# Patient Record
Sex: Female | Born: 1970 | Race: Black or African American | Hispanic: No | Marital: Married | Smoking: Never smoker
Health system: Southern US, Community
[De-identification: ages and names within clinical notes are randomized; demographics above are authoritative.]

## PROBLEM LIST (undated history)

## (undated) DIAGNOSIS — I499 Cardiac arrhythmia, unspecified: Secondary | ICD-10-CM

## (undated) DIAGNOSIS — R935 Abnormal findings on diagnostic imaging of other abdominal regions, including retroperitoneum: Secondary | ICD-10-CM

## (undated) DIAGNOSIS — R7989 Other specified abnormal findings of blood chemistry: Secondary | ICD-10-CM

## (undated) DIAGNOSIS — M459 Ankylosing spondylitis of unspecified sites in spine: Secondary | ICD-10-CM

## (undated) DIAGNOSIS — K5090 Crohn's disease, unspecified, without complications: Secondary | ICD-10-CM

## (undated) DIAGNOSIS — Z1589 Genetic susceptibility to other disease: Secondary | ICD-10-CM

## (undated) DIAGNOSIS — E785 Hyperlipidemia, unspecified: Secondary | ICD-10-CM

## (undated) DIAGNOSIS — B019 Varicella without complication: Secondary | ICD-10-CM

## (undated) DIAGNOSIS — Z8619 Personal history of other infectious and parasitic diseases: Secondary | ICD-10-CM

## (undated) DIAGNOSIS — J45909 Unspecified asthma, uncomplicated: Secondary | ICD-10-CM

## (undated) DIAGNOSIS — E611 Iron deficiency: Secondary | ICD-10-CM

## (undated) DIAGNOSIS — K9041 Non-celiac gluten sensitivity: Secondary | ICD-10-CM

## (undated) DIAGNOSIS — Z90710 Acquired absence of both cervix and uterus: Secondary | ICD-10-CM

## (undated) DIAGNOSIS — D649 Anemia, unspecified: Secondary | ICD-10-CM

## (undated) DIAGNOSIS — B009 Herpesviral infection, unspecified: Secondary | ICD-10-CM

## (undated) DIAGNOSIS — M62838 Other muscle spasm: Secondary | ICD-10-CM

## (undated) DIAGNOSIS — N939 Abnormal uterine and vaginal bleeding, unspecified: Secondary | ICD-10-CM

## (undated) DIAGNOSIS — M47819 Spondylosis without myelopathy or radiculopathy, site unspecified: Secondary | ICD-10-CM

## (undated) DIAGNOSIS — K529 Noninfective gastroenteritis and colitis, unspecified: Secondary | ICD-10-CM

## (undated) DIAGNOSIS — N390 Urinary tract infection, site not specified: Secondary | ICD-10-CM

## (undated) DIAGNOSIS — A630 Anogenital (venereal) warts: Secondary | ICD-10-CM

## (undated) HISTORY — DX: Acquired absence of both cervix and uterus: Z90.710

## (undated) HISTORY — DX: Herpesviral infection, unspecified: B00.9

## (undated) HISTORY — DX: Ankylosing spondylitis of unspecified sites in spine: M45.9

## (undated) HISTORY — DX: Non-celiac gluten sensitivity: K90.41

## (undated) HISTORY — DX: Other specified abnormal findings of blood chemistry: R79.89

## (undated) HISTORY — DX: Crohn's disease, unspecified, without complications: K50.90

## (undated) HISTORY — DX: Hypocalcemia: E83.51

## (undated) HISTORY — PX: COLONOSCOPY: SHX174

## (undated) HISTORY — DX: Hyperlipidemia, unspecified: E78.5

## (undated) HISTORY — DX: Personal history of other infectious and parasitic diseases: Z86.19

## (undated) HISTORY — DX: Anogenital (venereal) warts: A63.0

## (undated) HISTORY — DX: Anemia, unspecified: D64.9

## (undated) HISTORY — DX: Noninfective gastroenteritis and colitis, unspecified: K52.9

## (undated) HISTORY — DX: Spondylosis without myelopathy or radiculopathy, site unspecified: M47.819

## (undated) HISTORY — DX: Iron deficiency: E61.1

## (undated) HISTORY — DX: Abnormal findings on diagnostic imaging of other abdominal regions, including retroperitoneum: R93.5

## (undated) HISTORY — PX: MOUTH SURGERY: SHX715

## (undated) HISTORY — DX: Abnormal uterine and vaginal bleeding, unspecified: N93.9

## (undated) HISTORY — DX: Other muscle spasm: M62.838

## (undated) HISTORY — DX: Unspecified asthma, uncomplicated: J45.909

## (undated) HISTORY — PX: WISDOM TOOTH EXTRACTION: SHX21

## (undated) HISTORY — DX: Varicella without complication: B01.9

## (undated) HISTORY — DX: Urinary tract infection, site not specified: N39.0

## (undated) HISTORY — DX: Genetic susceptibility to other disease: Z15.89

---

## 2014-07-06 ENCOUNTER — Ambulatory Visit (INDEPENDENT_AMBULATORY_CARE_PROVIDER_SITE_OTHER): Payer: Federal, State, Local not specified - PPO | Admitting: Family Medicine

## 2014-07-06 ENCOUNTER — Encounter: Payer: Self-pay | Admitting: Family Medicine

## 2014-07-06 VITALS — BP 100/70 | HR 60 | Temp 98.2°F | Ht 63.0 in | Wt 97.0 lb

## 2014-07-06 DIAGNOSIS — R7989 Other specified abnormal findings of blood chemistry: Secondary | ICD-10-CM | POA: Insufficient documentation

## 2014-07-06 DIAGNOSIS — M47819 Spondylosis without myelopathy or radiculopathy, site unspecified: Secondary | ICD-10-CM

## 2014-07-06 DIAGNOSIS — Z1589 Genetic susceptibility to other disease: Secondary | ICD-10-CM | POA: Insufficient documentation

## 2014-07-06 DIAGNOSIS — K9041 Non-celiac gluten sensitivity: Secondary | ICD-10-CM | POA: Insufficient documentation

## 2014-07-06 NOTE — Assessment & Plan Note (Addendum)
Continue exercise, prn nsaids. Obtain records. 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 From my review of uptodate, prednisone is not preferred for long term management but may work for flares and this has seemed to work for patient in the past (including 2 weeks ago when went to urgent care). One concern for me is patient is already underweight and likely with low bone density and steroids may worsen this (but also may be needed and therefore tough balance) Records will hopefull give more information and I will follow up with patient in 2 months to allow time to receive records. Will make determination on needed bloodwork at that time as well. Patient has seen rheumatology before and didn't seem interested in meds outside of nsaids and prednisone. 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 Since being off gluten has helped her, I did not dissuade this threapy.

## 2014-07-06 NOTE — Patient Instructions (Addendum)
Wonderful to meet you.   No obvious abnormalities on exam.   Want to start by getting records and then have you back in 2 months to reassess and see if we need to update your bloodwork.   Please call for a mammogram.   I am happy to do Pap smears for you or you can seek ob/gyn care.   Happy to care for your whole family if you would like.   Drop off your release of information form at the front desk.   Consider allegra or zyrtec as allergies are certainly different here.   Addendum: advised weight closer to 105

## 2014-07-06 NOTE — Progress Notes (Signed)
Janice Reddish, MD Phone: [phone]  Subjective:  Patient presents today to establish care as new patient. Chief complaint-noted.   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 Several years ago was getting sick recurrently. 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  Losing weight, not feeling well, sickness and pain especially in low back and neck. Tested positive for gluten intolerance fecally. Improved about 80% in symptoms with gluten free diet.   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 Took prednisone intermittently for flares in pain which seems to help. Previously had been on aleve regularly. .Diet, exercise, regular aleve use seem to be mainstay of her treatment. . Tendency to have iron and vitamin D low. Had seen rheumatologist but patient/medical doctor made decision to not place on immunotherapy. Felt great on raw diet when she tried it. Monitored through CRP levels at past doctor. 2 weeks ago had a flare and was placed on prednisone at an urgent care. Symptoms have resolved. She takes supplements including fish oil, vitamin D regularly (has had low vitamin D. Severe stiffness 2-3x a year now.   Health Maintanence LMP 06/29/14 Pap last year Mammogram never  Birth control-Husband with vasectomy  ROS-intermittent neck and back stiffness  The following were reviewed and entered/updated in epic: Past Medical History  Diagnosis Date  . Chicken pox   . UTI (lower urinary tract infection)   . Low serum vitamin D   . Non-celiac gluten sensitivity   . Spondyloarthritis    Patient Active Problem List   Diagnosis Date Noted  . Low serum vitamin D   . Non-celiac gluten sensitivity   . Spondyloarthritis    Past Surgical History  Procedure Laterality Date  . None    . Mouth surgery      cyst in mouth  . Wisdom tooth extraction    .  Colonoscopy      Family History  Problem Relation Age of Onset  . Hypertension Mother   . Diabetes Mother     ? father    Medications- reviewed and updated No current outpatient prescriptions on file.   No current facility-administered medications for this visit.    Allergies-reviewed and updated No Known Allergies  History   Social History  . Marital Status: Married    Spouse Name: N/A    Number of Children: N/A  . Years of Education: N/A   Social History Main Topics  . Smoking status: Never Smoker   . Smokeless tobacco: Not on file  . Alcohol Use: 1.0 oz/week    2 drink(s) per week  . Drug Use: No  . Sexual Activity: Yes    Partners: Male   Other Topics Concern  . 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 [name]. [name].    Got 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 Oropharynx normal. Good dentition.  Eyes: sclera and lids normal, PERRLA Neck: no thyromegaly, no lymphadenopathy CV: RRR no murmurs rubs or gallops Lungs: CTAB no crackles, wheeze, rhonchi Abdomen: soft/nontender/nondistended/normal bowel sounds. No rebound or guarding.  Ext: no edema, 2+ DP and PT and radial pulses Skin: warm, dry, no rash Neuro: 5/5 strength upper and lower extremities   Assessment/Plan:  Spondyloarthritis Continue exercise, prn nsaids. Obtain records. 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 From my review of  uptodate, prednisone is not preferred but this has seemed to work for patient in the past. Records will hopefull give more information and I will follow up with patient in 2 months to allow time to receive records. Will make determination on needed bloodwork at that time as well.   Have also contacted Dr. Charlann Boxer to see as patient wants to avoid rheum medications most likely if he thought he could help in care for this woman with currently unclear diagnosis.

## 2014-07-11 ENCOUNTER — Other Ambulatory Visit (INDEPENDENT_AMBULATORY_CARE_PROVIDER_SITE_OTHER): Payer: Federal, State, Local not specified - PPO

## 2014-07-11 ENCOUNTER — Encounter: Payer: Self-pay | Admitting: Internal Medicine

## 2014-07-11 ENCOUNTER — Ambulatory Visit (INDEPENDENT_AMBULATORY_CARE_PROVIDER_SITE_OTHER)
Admission: RE | Admit: 2014-07-11 | Discharge: 2014-07-11 | Disposition: A | Discharge status: Home / Self Care | Payer: Federal, State, Local not specified - PPO | Source: Ambulatory Visit | Attending: Internal Medicine | Admitting: Internal Medicine

## 2014-07-11 ENCOUNTER — Institutional Professional Consult (permissible substitution): Payer: Federal, State, Local not specified - PPO | Admitting: Internal Medicine

## 2014-07-11 ENCOUNTER — Ambulatory Visit (INDEPENDENT_AMBULATORY_CARE_PROVIDER_SITE_OTHER): Payer: Federal, State, Local not specified - PPO | Admitting: Internal Medicine

## 2014-07-11 VITALS — BP 100/64 | HR 88 | Temp 98.0°F | Ht 63.0 in | Wt 100.4 lb

## 2014-07-11 DIAGNOSIS — R0600 Dyspnea, unspecified: Secondary | ICD-10-CM | POA: Insufficient documentation

## 2014-07-11 DIAGNOSIS — D509 Iron deficiency anemia, unspecified: Secondary | ICD-10-CM | POA: Insufficient documentation

## 2014-07-11 DIAGNOSIS — J4530 Mild persistent asthma, uncomplicated: Secondary | ICD-10-CM

## 2014-07-11 DIAGNOSIS — R058 Other specified cough: Secondary | ICD-10-CM

## 2014-07-11 DIAGNOSIS — R05 Cough: Secondary | ICD-10-CM

## 2014-07-11 LAB — CBC WITH DIFFERENTIAL/PLATELET
Basophils Absolute: 0 10*3/uL (ref 0.0–0.1)
Basophils Relative: 0.2 % (ref 0.0–3.0)
EOS PCT: 1.8 % (ref 0.0–5.0)
Eosinophils Absolute: 0.2 10*3/uL (ref 0.0–0.7)
HCT: 32 % — ABNORMAL LOW (ref 36.0–46.0)
HEMOGLOBIN: 10.1 g/dL — AB (ref 12.0–15.0)
Lymphocytes Relative: 26.7 % (ref 12.0–46.0)
Lymphs Abs: 2.3 10*3/uL (ref 0.7–4.0)
MCHC: 31.7 g/dL (ref 30.0–36.0)
MCV: 69.3 fl — ABNORMAL LOW (ref 78.0–100.0)
MONOS PCT: 6.6 % (ref 3.0–12.0)
Monocytes Absolute: 0.6 10*3/uL (ref 0.1–1.0)
Neutro Abs: 5.6 10*3/uL (ref 1.4–7.7)
Neutrophils Relative %: 64.7 % (ref 43.0–77.0)
PLATELETS: 404 10*3/uL — AB (ref 150.0–400.0)
RBC: 4.62 Mil/uL (ref 3.87–5.11)
RDW: 18.7 % — ABNORMAL HIGH (ref 11.5–15.5)
WBC: 8.6 10*3/uL (ref 4.0–10.5)

## 2014-07-11 MED ORDER — MOMETASONE FURO-FORMOTEROL FUM 100-5 MCG/ACT IN AERO: 2.0000 | INHALATION_SPRAY | Freq: Two times a day (BID) | RESPIRATORY_TRACT | Status: DC

## 2014-07-11 NOTE — Patient Instructions (Addendum)
Please remember to go to the lab and x-ray department downstairs for your tests - we will call you with the results when they are available.    GERD (REFLUX)  is an extremely common cause of respiratory symptoms, many times with no significant heartburn at all.    It can be treated with medication, but also with lifestyle changes including avoidance of late meals, excessive alcohol, smoking cessation, and avoid fatty foods, chocolate, peppermint, colas, red wine, and acidic juices such as orange juice.  NO MINT OR MENTHOL PRODUCTS SO NO COUGH DROPS  USE SUGARLESS CANDY INSTEAD (jolley ranchers or Stover's)  NO OIL BASED VITAMINS - use powdered substitutes.    Work on inhaler technique:  relax and gently blow all the way out then take a nice smooth deep breath back in, triggering the inhaler at same time you start breathing in.  Hold for up to 5 seconds if you can.  Rinse and gargle with water when done    Try dulera 100 Take 2 puffs first thing in am and then another 2 puffs about 12 hours later.   Please schedule a follow up office visit in 2 weeks, sooner if needed

## 2014-07-11 NOTE — Progress Notes (Signed)
Quick Note:  Spoke with pt and notified of results per Dr. Wert. Pt verbalized understanding and denied any questions.  ______ 

## 2014-07-11 NOTE — Progress Notes (Signed)
   Subjective:    Patient ID: Janice Zuniga, female    DOB: Dec 18, 1970  MRN: [id number]  HPI  82 yobf never smoker with some sniffles itchy throat in Spring x decades controlled originally with otcs like  clariton with need for inhaler transiently 2012 only while dog in house then w/in a week of movingto  [place] from [place] = corporate apt since May 13 2014 but symptoms started earlier in August noted sob with speech/ mild chest tightness better with saba but not eliminated (proair) so self referred 07/11/2014 to pulmonary clinic   07/11/2014 1st [facility] office visit/ Wert   Chief Complaint  Patient presents with  . Pulmonary Consult    Self referral. Pt c/o SOB with or without exertion for the past 2 months. Albuterol helps slightly- using albuterol inhaler a few times per day.   took liquid alb from children worked better [employer] on  07/09/14 before worked out  No noct symptoms Assoc with some coughing but no excess or purulent   mucus Allegra helped some  Worse with voice use which causes hoarseness  also  No obvious other patterns in day to day or daytime variabilty or assoc  cp or subjective wheeze overt sinus or hb symptoms. No unusual exp hx or h/o childhood pna/ asthma or knowledge of premature birth.  Sleeping ok without nocturnal  or early am exacerbation  of respiratory  c/o's or need for noct saba. Also denies any obvious fluctuation of symptoms with weather or environmental changes or other aggravating or alleviating factors except as outlined above   Current Medications, Allergies, Complete Past Medical History, Past Surgical History, Family History, and Social History were reviewed in [facility] record.            Review of Systems  Constitutional: Negative for fever, chills and unexpected weight change.  HENT: Negative for congestion, dental problem, ear pain, nosebleeds, postnasal drip, rhinorrhea, sinus pressure, sneezing, sore  throat, trouble swallowing and voice change.   Eyes: Negative for visual disturbance.  Respiratory: Positive for shortness of breath. Negative for cough and choking.   Cardiovascular: Negative for chest pain and leg swelling.  Gastrointestinal: Negative for vomiting, abdominal pain and diarrhea.  Genitourinary: Negative for difficulty urinating.  Musculoskeletal: Negative for arthralgias.  Skin: Negative for rash.  Neurological: Negative for tremors, syncope and headaches.  Hematological: Does not bruise/bleed easily.       Objective:   Physical Exam  Wt Readings from Last 3 Encounters:  07/11/14 100 lb 6.4 oz (45.541 kg)  07/06/14 97 lb (43.999 kg)     Very pleasant amb bf with classic pseudowheeze only with fvc  HEENT: nl dentition, turbinates, and orophanx. Nl external ear canals without cough reflex   NECK :  without JVD/Nodes/TM/ nl carotid upstrokes bilaterally   LUNGS: no acc muscle use, clear to A and P bilaterally without cough on insp or exp maneuvers   CV:  RRR  no s3 or murmur or increase in P2, no edema   ABD:  soft and nontender with nl excursion in the supine position. No bruits or organomegaly, bowel sounds nl  MS:  warm without deformities, calf tenderness, cyanosis or clubbing  SKIN: warm and dry without lesions    NEURO:  alert, approp, no deficits     CXR  07/11/2014 :  No active cardiopulmonary disease.      Assessment & Plan:

## 2014-07-11 NOTE — Progress Notes (Signed)
Quick Note:  LMTCB ______ 

## 2014-07-12 ENCOUNTER — Other Ambulatory Visit (INDEPENDENT_AMBULATORY_CARE_PROVIDER_SITE_OTHER): Payer: Federal, State, Local not specified - PPO

## 2014-07-12 ENCOUNTER — Encounter: Payer: Self-pay | Admitting: Internal Medicine

## 2014-07-12 ENCOUNTER — Telehealth: Payer: Self-pay | Admitting: Internal Medicine

## 2014-07-12 DIAGNOSIS — J4530 Mild persistent asthma, uncomplicated: Secondary | ICD-10-CM | POA: Insufficient documentation

## 2014-07-12 DIAGNOSIS — J45909 Unspecified asthma, uncomplicated: Secondary | ICD-10-CM | POA: Insufficient documentation

## 2014-07-12 DIAGNOSIS — D509 Iron deficiency anemia, unspecified: Secondary | ICD-10-CM

## 2014-07-12 LAB — ALLERGY FULL PROFILE
Allergen, D pternoyssinus,d7: 3.76 kU/L — ABNORMAL HIGH
Alternaria Alternata: <0.1 kU/L
Aspergillus fumigatus, m3: <0.1 kU/L
BERMUDA GRASS: 83.5 kU/L — AB
BOX ELDER: 7.84 kU/L — AB
Bahia Grass: 88.6 kU/L — ABNORMAL HIGH
Cat Dander: 71.7 kU/L — ABNORMAL HIGH
Common Ragweed: 18.7 kU/L — ABNORMAL HIGH
Curvularia lunata: <0.1 kU/L
D. farinae: 3.76 kU/L — ABNORMAL HIGH
Dog Dander: 11.2 kU/L — ABNORMAL HIGH
Elm IgE: 2.32 kU/L — ABNORMAL HIGH
Fescue: 77.5 kU/L — ABNORMAL HIGH
G005 RYE, PERENNIAL: 73.5 kU/L — AB
G009 Red Top: 81.3 kU/L — ABNORMAL HIGH
Goldenrod: 9.42 kU/L — ABNORMAL HIGH
Helminthosporium halodes: <0.1 kU/L
House Dust Hollister: 10.4 kU/L — ABNORMAL HIGH
IGE (IMMUNOGLOBULIN E), SERUM: 475 kU/L — AB (ref <115)
Lamb's Quarters: 29.2 kU/L — ABNORMAL HIGH
OAK CLASS: 51.5 kU/L — AB
Plantain: 23.5 kU/L — ABNORMAL HIGH
STEMPHYLIUM BOTRYOSUM: 0.12 kU/L — AB
Sycamore Tree: 51.6 kU/L — ABNORMAL HIGH
TIMOTHY GRASS: 67.9 kU/L — AB

## 2014-07-12 LAB — IRON: Iron: 27 ug/dL — ABNORMAL LOW (ref 42–145)

## 2014-07-12 LAB — IRON AND TIBC
%SAT: 7 % — ABNORMAL LOW (ref 20–55)
Iron: 23 ug/dL — ABNORMAL LOW (ref 42–145)
TIBC: 327 ug/dL (ref 250–470)
UIBC: 304 ug/dL (ref 125–400)

## 2014-07-12 NOTE — Telephone Encounter (Signed)
Pt returned call.Janice Zuniga

## 2014-07-12 NOTE — Telephone Encounter (Signed)
Call patient : Studies are c/w lots of allergies esp cats/ dogs/grass/ ragweed > no change needed for now but will discuss further at next ov [facility]

## 2014-07-12 NOTE — Assessment & Plan Note (Signed)
Some of her symptoms and the early portion of her FV loop suggest  Classic Upper airway cough syndrome, so named because it's frequently impossible to sort out how much is  CR/sinusitis with freq throat clearing (which can be related to primary GERD)   vs  causing  secondary (" extra esophageal")  GERD from wide swings in gastric pressure that occur with throat clearing, often  promoting self use of mint and menthol lozenges that reduce the lower esophageal sphincter tone and exacerbate the problem further in a cyclical fashion.   These are the same pts (now being labeled as having "irritable larynx syndrome" by some cough centers) who not infrequently have a history of having failed to tolerate ace inhibitors,  dry powder inhalers or biphosphonates or report having atypical reflux symptoms that don't respond to standard doses of PPI , and are easily confused as having aecopd or asthma flares by even experienced allergists/ pulmonologists.   For now just rx with diet and avoid dpi's/ace's and she should do fine

## 2014-07-12 NOTE — Telephone Encounter (Signed)
Called and spoke to pt. Informed pt of the results and recs per MW. Pt verbalized understanding and denied any further questions or concerns at this time.

## 2014-07-12 NOTE — Telephone Encounter (Signed)
Pt returned call

## 2014-07-12 NOTE — Assessment & Plan Note (Signed)
Lab Results  Component Value Date   HGB 10.1* 07/11/2014     Needs f/u per primary care

## 2014-07-12 NOTE — Progress Notes (Signed)
Quick Note:  LMTCB ______ 

## 2014-07-12 NOTE — Assessment & Plan Note (Addendum)
DDX of  difficult airways management all start with A and  include Adherence, Ace Inhibitors, Acid Reflux, Active Sinus Disease, Alpha 1 Antitripsin deficiency, Anxiety masquerading as Airways dz,  ABPA,  allergy(esp in young), Aspiration (esp in elderly), Adverse effects of DPI,  Active smokers, plus two Bs  = Bronchiectasis and Beta blocker use..and one C= CHF  Adherence is always the initial "prime suspect" and is a multilayered concern that requires a "trust but verify" approach in every patient - starting with knowing how to use medications, especially inhalers, correctly, keeping up with refills and understanding the fundamental difference between maintenance and prns vs those medications only taken for a very short course and then stopped and not refilled.  The proper method of use, as well as anticipated side effects, of a metered-dose inhaler are discussed and demonstrated to the patient. Improved effectiveness after extensive coaching during this visit to a level of approximately  75% so try dulera 100 2bid  Allergy noted > consider adding singulair next ov  ? Active sinus dz > consider also sinus ct if any increase upper airway symptoms  ? Acid (or non-acid) GERD > always difficult to exclude as up to 75% of pts in some series report no assoc GI/ Heartburn symptoms> rec   diet restrictions/ reviewed and instructions given in writing.

## 2014-07-12 NOTE — Telephone Encounter (Signed)
LMTCB

## 2014-07-12 NOTE — Assessment & Plan Note (Signed)
Probably multifactorial related to asthma/ anemia/ decondtioning

## 2014-07-15 ENCOUNTER — Telehealth: Payer: Self-pay | Admitting: Internal Medicine

## 2014-07-15 NOTE — Telephone Encounter (Signed)
Left detailed msg with lab results

## 2014-07-15 NOTE — Telephone Encounter (Signed)
Pt is calling back again.  Janice Zuniga

## 2014-07-15 NOTE — Progress Notes (Signed)
Quick Note:  LMTCB ______ 

## 2014-07-15 NOTE — Progress Notes (Signed)
Quick Note:  LMOM with results ______ 

## 2014-07-25 ENCOUNTER — Encounter: Payer: Self-pay | Admitting: Internal Medicine

## 2014-07-25 ENCOUNTER — Ambulatory Visit (INDEPENDENT_AMBULATORY_CARE_PROVIDER_SITE_OTHER): Payer: Federal, State, Local not specified - PPO | Admitting: Internal Medicine

## 2014-07-25 VITALS — BP 126/74 | HR 100 | Ht 63.0 in | Wt 100.0 lb

## 2014-07-25 DIAGNOSIS — J4530 Mild persistent asthma, uncomplicated: Secondary | ICD-10-CM

## 2014-07-25 MED ORDER — MOMETASONE FURO-FORMOTEROL FUM 100-5 MCG/ACT IN AERO: 2.0000 | INHALATION_SPRAY | Freq: Two times a day (BID) | RESPIRATORY_TRACT | Status: DC

## 2014-07-25 NOTE — Progress Notes (Signed)
Subjective:    Patient ID: Janice Zuniga, female    DOB: 1971-01-28  MRN: [id number]  HPI  73 yobf never smoker with some sniffles itchy throat in Spring x decades controlled originally with otcs like  clariton with need for inhaler transiently 2012 only while dog in house then w/in a week of moving to  [place] from [employer] apt since May 13 2014 but symptoms started earlier in August noted sob with speech/ mild chest tightness better with saba but not eliminated (proair) so self referred 07/11/2014 to pulmonary clinic   07/11/2014 1st [facility] office visit/ Janice Zuniga   Chief Complaint  Patient presents with  . Pulmonary Consult    Self referral. Pt c/o SOB with or without exertion for the past 2 months. Albuterol helps slightly- using albuterol inhaler a few times per day.   took liquid alb from children worked better [employer] on  07/09/14 before worked out  No noct symptoms Assoc with some coughing but no excess or purulent   mucus Allegra helped some  Worse with voice use which causes hoarseness  Also rec Please remember to go to the lab and x-ray department downstairs for your tests - we will call you with the results when they are available. GERD diet  Work on inhaler technique:  relax and gently blow all the way out then take a nice smooth deep breath back in, triggering the inhaler at same time you start breathing in.  Hold for up to 5 seconds if you can.  Rinse and gargle with water when done Try dulera 100 Take 2 puffs first thing in am and then another 2 puffs about 12 hours later.    07/25/2014 f/u ov/Janice Zuniga re:  Chief Complaint  Patient presents with  . Follow-up    Pt states that her breathing has improved since last visit. Has not had to use her rescue inhaler.  No new co's today.   even joints feel better since starting dulera 100 2bid   Not limited by breathing from desired activities    No obvious day to day or daytime variabilty or assoc chronic  cough or cp or chest tightness, subjective wheeze overt sinus or hb symptoms. No unusual exp hx or h/o childhood pna/ asthma or knowledge of premature birth.  Sleeping ok without nocturnal  or early am exacerbation  of respiratory  c/o's or need for noct saba. Also denies any obvious fluctuation of symptoms with weather or environmental changes or other aggravating or alleviating factors except as outlined above   Current Medications, Allergies, Complete Past Medical History, Past Surgical History, Family History, and Social History were reviewed in [facility] record.  ROS  The following are not active complaints unless bolded sore throat, dysphagia, dental problems, itching, sneezing,  nasal congestion or excess/ purulent secretions, ear ache,   fever, chills, sweats, unintended wt loss, pleuritic or exertional cp, hemoptysis,  orthopnea pnd or leg swelling, presyncope, palpitations, heartburn, abdominal pain, anorexia, nausea, vomiting, diarrhea  or change in bowel or urinary habits, change in stools or urine, dysuria,hematuria,  rash, arthralgias, visual complaints, headache, numbness weakness or ataxia or problems with walking or coordination,  change in mood/affect or memory.                         Objective:   Physical Exam    Very pleasant amb bf with min pseudowheeze only with fvc   Wt Readings from  Last 3 Encounters:  07/25/14 100 lb (45.36 kg)  07/11/14 100 lb 6.4 oz (45.541 kg)  07/06/14 97 lb (43.999 kg)      HEENT: nl dentition, turbinates, and orophanx. Nl external ear canals without cough reflex   NECK :  without JVD/Nodes/TM/ nl carotid upstrokes bilaterally   LUNGS: no acc muscle use, clear to A and P bilaterally without cough on insp or exp maneuvers   CV:  RRR  no s3 or murmur or increase in P2, no edema   ABD:  soft and nontender with nl excursion in the supine position. No bruits or organomegaly, bowel sounds nl  MS:  warm  without deformities, calf tenderness, cyanosis or clubbing  SKIN: warm and dry without lesions    NEURO:  alert, approp, no deficits     CXR  07/11/2014 :  No active cardiopulmonary disease.      Assessment & Plan:

## 2014-07-25 NOTE — Patient Instructions (Addendum)
Continue dulera 100 Take 2 puffs first thing in am and then another 2 puffs about 12 hours later.   Work on Engineer, technical sales technique:  relax and gently blow all the way out then take a nice smooth deep breath back in, triggering the inhaler at same time you start breathing in.  Hold for up to 5 seconds if you can.  Rinse and gargle with water when done   Please schedule a follow up office visit in 6 weeks, call sooner if needed to see if we need to modify your plan or continue the dulera 100

## 2014-07-26 ENCOUNTER — Encounter: Payer: Self-pay | Admitting: Internal Medicine

## 2014-08-19 NOTE — Progress Notes (Signed)
ecords from prior MD reviewed: Scanned in records but most visits- I cannot decipher the records 04/18/14-Assessment states connective tissue disorder and joint pain. changed to zanaflex from flexeril for muscle stiffness.  04/11/14- HPI-pain in neck radiating down to low back associated with stifness. Pain 4-9/10. Assessment states history of diffuse pain and probably connective tissue disorder with current flare. Stop all nsaids, add flexeril. Medrol dose pack.  03/18/11-HPI neck has been tense. Assessment-can only read increase exercise.  08/14/13-general follow up. Rest of note unreadable except f/u 6-8 weeks.  07/22/12. Routine follow up. Rest of note unreadable

## 2014-08-23 ENCOUNTER — Ambulatory Visit (INDEPENDENT_AMBULATORY_CARE_PROVIDER_SITE_OTHER): Payer: Federal, State, Local not specified - PPO | Admitting: Internal Medicine

## 2014-08-23 ENCOUNTER — Encounter: Payer: Self-pay | Admitting: Internal Medicine

## 2014-08-23 VITALS — BP 90/64 | HR 75 | Temp 98.2°F | Ht 63.0 in | Wt 102.4 lb

## 2014-08-23 DIAGNOSIS — J4530 Mild persistent asthma, uncomplicated: Secondary | ICD-10-CM

## 2014-08-23 MED ORDER — MOMETASONE FURO-FORMOTEROL FUM 100-5 MCG/ACT IN AERO: 2.0000 | INHALATION_SPRAY | Freq: Two times a day (BID) | RESPIRATORY_TRACT | Status: DC

## 2014-08-23 NOTE — Patient Instructions (Addendum)
Continue dulera 100 Take 2 puffs first thing in am and then another 2 puffs about 12 hours later.   Only use your albuterol as a rescue medication to be used if you can't catch your breath by resting or doing a relaxed purse lip breathing pattern.  - The less you use it, the better it will work when you need it. - Ok to use up to 2 puffs  every 4 hours if you must but call for immediate appointment if use goes up over your usual need - Don't leave home without it !!  (think of it like the spare tire for your car)   Please schedule a follow up visit in 6  months but call sooner if needed

## 2014-08-23 NOTE — Progress Notes (Signed)
Subjective:    Patient ID: Janice Zuniga, female    DOB: Jan 02, 1971  MRN: [id number]    Brief patient profile:  33 yobf never smoker with some sniffles itchy throat in Spring x decades controlled originally with otcs like  clariton with need for inhaler transiently 2012 only while dog in house then w/in a week of moving to  [place] from [employer] apt since May 13 2014 but symptoms started earlier in August noted sob with speech/ mild chest tightness better with saba but not eliminated (proair) so self referred 07/11/2014 to pulmonary clinic   07/11/2014 1st [facility] office visit/ Janice Zuniga   Chief Complaint  Patient presents with  . Pulmonary Consult    Self referral. Pt c/o SOB with or without exertion for the past 2 months. Albuterol helps slightly- using albuterol inhaler a few times per day.   took liquid alb from children worked better [employer] on  07/09/14 before worked out  No noct symptoms Assoc with some coughing but no excess or purulent   mucus Allegra helped some  Worse with voice use which causes hoarseness  Also rec Please remember to go to the lab and x-ray department downstairs for your tests - we will call you with the results when they are available. GERD diet  Work on inhaler technique:  relax and gently blow all the way out then take a nice smooth deep breath back in, triggering the inhaler at same time you start breathing in.  Hold for up to 5 seconds if you can.  Rinse and gargle with water when done Try dulera 100 Take 2 puffs first thing in am and then another 2 puffs about 12 hours later.        08/23/2014 f/u ov/Janice Zuniga re: mild persistent asthma likely atopic  Chief Complaint  Patient presents with  . Follow-up    f/u asthma; no complaints      Not limited by breathing from desired activities    No obvious day to day or daytime variabilty or assoc chronic cough or cp or chest tightness, subjective wheeze overt sinus or hb symptoms. No  unusual exp hx or h/o childhood pna/ asthma or knowledge of premature birth.  Sleeping ok without nocturnal  or early am exacerbation  of respiratory  c/o's or need for noct saba. Also denies any obvious fluctuation of symptoms with weather or environmental changes or other aggravating or alleviating factors except as outlined above   Current Medications, Allergies, Complete Past Medical History, Past Surgical History, Family History, and Social History were reviewed in [employer] record.  ROS  The following are not active complaints unless bolded sore throat, dysphagia, dental problems, itching, sneezing,  nasal congestion or excess/ purulent secretions, ear ache,   fever, chills, sweats, unintended wt loss, pleuritic or exertional cp, hemoptysis,  orthopnea pnd or leg swelling, presyncope, palpitations, heartburn, abdominal pain, anorexia, nausea, vomiting, diarrhea  or change in bowel or urinary habits, change in stools or urine, dysuria,hematuria,  rash, arthralgias, visual complaints, headache, numbness weakness or ataxia or problems with walking or coordination,  change in mood/affect or memory.                         Objective:   Physical Exam    Very pleasant amb bf  08/23/14            102  Wt Readings from Last 3 Encounters:  07/25/14 100 lb (  45.36 kg)  07/11/14 100 lb 6.4 oz (45.541 kg)  07/06/14 97 lb (43.999 kg)      HEENT: nl dentition, turbinates, and orophanx. Nl external ear canals without cough reflex   NECK :  without JVD/Nodes/TM/ nl carotid upstrokes bilaterally   LUNGS: no acc muscle use, clear to A and P bilaterally without cough on insp or exp maneuvers   CV:  RRR  no s3 or murmur or increase in P2, no edema   ABD:  soft and nontender with nl excursion in the supine position. No bruits or organomegaly, bowel sounds nl  MS:  warm without deformities, calf tenderness, cyanosis or clubbing        CXR  07/11/2014 :  No  active cardiopulmonary disease.      Assessment & Plan:

## 2014-08-24 NOTE — Assessment & Plan Note (Addendum)
-   Allergy profile 07/11/2014  IgE  475 esp cat/ dog grass   Reviewed allergy profile/ key is avoidance, seems to be doing better on dulera 100  so no need to change it for now    The proper method of use, as well as anticipated side effects, of a metered-dose inhaler are discussed and demonstrated to the patient. Improved effectiveness after extensive coaching during this visit to a level of approximately  75%> needs to perfect it.     Each maintenance medication was reviewed in detail including most importantly the difference between maintenance and as needed and under what circumstances the prns are to be used.  Please see instructions for details which were reviewed in writing and the patient given a copy.

## 2014-08-24 NOTE — Assessment & Plan Note (Addendum)
-  Allergy profile 07/11/2014  IgE  475 esp cat/ dog grass   All goals of chronic asthma control met including optimal function and elimination of symptoms with minimal need for rescue therapy.  Contingencies discussed in full including contacting this office immediately if not controlling the symptoms using the rule of two's.     Each maintenance medication was reviewed in detail including most importantly the difference between maintenance and as needed and under what circumstances the prns are to be used.  Please see instructions for details which were reviewed in writing and the patient given a copy.    The proper method of use, as well as anticipated side effects, of a metered-dose inhaler are discussed and demonstrated to the patient. Improved effectiveness after extensive coaching during this visit to a level of approximately  75% so could do better   Will consider step down to qvar if doing well in 3 months

## 2014-09-07 ENCOUNTER — Ambulatory Visit: Payer: Federal, State, Local not specified - PPO | Admitting: Family Medicine

## 2015-03-17 ENCOUNTER — Other Ambulatory Visit: Payer: Self-pay | Admitting: Family Medicine

## 2015-03-17 DIAGNOSIS — Z1231 Encounter for screening mammogram for malignant neoplasm of breast: Secondary | ICD-10-CM

## 2015-03-29 ENCOUNTER — Ambulatory Visit: Payer: Federal, State, Local not specified - PPO

## 2015-03-29 ENCOUNTER — Encounter: Payer: Self-pay | Admitting: Obstetrics and Gynecology

## 2015-03-29 ENCOUNTER — Ambulatory Visit (INDEPENDENT_AMBULATORY_CARE_PROVIDER_SITE_OTHER): Payer: Federal, State, Local not specified - PPO | Admitting: Obstetrics and Gynecology

## 2015-03-29 VITALS — BP 114/70 | HR 70 | Resp 14 | Ht 63.0 in | Wt 100.6 lb

## 2015-03-29 DIAGNOSIS — R82998 Other abnormal findings in urine: Secondary | ICD-10-CM

## 2015-03-29 DIAGNOSIS — Z0000 Encounter for general adult medical examination without abnormal findings: Secondary | ICD-10-CM

## 2015-03-29 DIAGNOSIS — N6320 Unspecified lump in the left breast, unspecified quadrant: Secondary | ICD-10-CM

## 2015-03-29 DIAGNOSIS — N390 Urinary tract infection, site not specified: Secondary | ICD-10-CM

## 2015-03-29 DIAGNOSIS — N63 Unspecified lump in breast: Secondary | ICD-10-CM

## 2015-03-29 DIAGNOSIS — N841 Polyp of cervix uteri: Secondary | ICD-10-CM

## 2015-03-29 DIAGNOSIS — D649 Anemia, unspecified: Secondary | ICD-10-CM

## 2015-03-29 DIAGNOSIS — Z01419 Encounter for gynecological examination (general) (routine) without abnormal findings: Secondary | ICD-10-CM | POA: Diagnosis not present

## 2015-03-29 LAB — POCT URINALYSIS DIPSTICK
Urobilinogen, UA: NEGATIVE
pH, UA: 5

## 2015-03-29 NOTE — Progress Notes (Signed)
44 y.o. G71P3003 Married [place] American female here for annual exam.    History of heavy menstrual bleeding and anemia.  Painful.  Tried chaste Tree [place] and uncertain if it is working well. Aleve helps.  History of fibroids.  Hgb 10.1 with Dr. Melvyn Novas 8 months ago.   Has Celiac sprue.   Has arthritic stiffness and pain.  Has seen a rheumatologist in the past.   Works in [place].  Moved from [place]. Married with 3 children.   PCP:   Dr. Rachell Cipro  Hgb: 10.4  No LMP recorded.          Sexually active: Yes.    The current method of family planning is none.    Exercising: Yes.    yoga, weights,tennis Smoker:  no  Health Maintenance: Pap:  2014 History of abnormal Pap:  no MMG:  None/scld for MMG on 03/29/15 - has appointment.  Colonoscopy:  2009/2010 BMD:   none  Result  none TDaP:  2009/2010 Screening Labs: follows with PCP, Urine today: WBC++ - No dysuria.  Hx of UTI in the past. Usually has 2 per year.    reports that she has never smoked. She has never used smokeless tobacco. She reports that she drinks about 1.0 oz of alcohol per week. She reports that she does not use illicit drugs.  Past Medical History  Diagnosis Date  . Chicken pox   . UTI (lower urinary tract infection)   . Low serum vitamin D   . Non-celiac gluten sensitivity   . Spondyloarthritis     Past Surgical History  Procedure Laterality Date  . None    . Mouth surgery      cyst in mouth  . Wisdom tooth extraction    . Colonoscopy      Current Outpatient Prescriptions  Medication Sig Dispense Refill  . Cholecalciferol (VITAMIN D PO) Take 1 tablet by mouth daily.    . mometasone-formoterol (DULERA) 100-5 MCG/ACT AERO Inhale 2 puffs into the lungs 2 (two) times daily. 1 Inhaler 0  . Polysaccharide Iron Complex (IRON UP) 15 MG/0.5ML LIQD As directed twice per day    . PROAIR HFA 108 (90 BASE) MCG/ACT inhaler Inhale 2 puffs into the lungs every 4 (four) hours as needed.      No current facility-administered medications for this visit.    Family History  Problem Relation Age of Onset  . Hypertension Mother   . Diabetes Mother     ? father  . Cancer Paternal Grandmother     ? type    ROS:  Pertinent items are noted in HPI.  Otherwise, a comprehensive ROS was negative.  Exam:   There were no vitals taken for this visit.    General appearance: alert, cooperative and appears stated age Head: Normocephalic, without obvious abnormality, atraumatic Neck: no adenopathy, supple, symmetrical, trachea midline and thyroid normal to inspection and palpation Lungs: clear to auscultation bilaterally Breasts: normal appearance, no masses or tenderness, Inspection negative, No nipple retraction or dimpling, No nipple discharge or bleeding, No axillary or supraclavicular adenopathy on the right breast.  4 mm firm well circumscribed lump of left breast at 4:00.  No nodes, retractions, axillary adenopathy.  Heart: regular rate and rhythm Abdomen: soft, non-tender; bowel sounds normal; no masses,  no organomegaly Extremities: extremities normal, atraumatic, no cyanosis or edema Skin: Skin color, texture, turgor normal. No rashes or lesions Lymph nodes: Cervical, supraclavicular, and axillary nodes normal. No abnormal inguinal nodes palpated  Neurologic: Grossly normal  Pelvic: External genitalia:  no lesions              Urethra:  normal appearing urethra with no masses, tenderness or lesions              Bartholins and Skenes: normal                 Vagina: normal appearing vagina with normal color and discharge, no lesions              Cervix: Polyp removed.  Size 5 mm protruding from the os. Verbal consent obtained first.  Tissue to pathology.               Pap taken: Yes.   Bimanual Exam:  Uterus:  Retroverted uterus with irregularity and posterior fibroid 2 cm?              Adnexa: normal adnexa              Rectovaginal: Yes.  .  Confirms.              Anus:   normal sphincter tone, no lesions  Chaperone was present for exam.  Assessment:   Well woman visit with normal exam. Left breast mass.  Menorrhagia and anemia. History of fibroids.  Cervical polyp.  Abnormal urine.   Plan: Yearly mammogram recommended after age 26. Will schedule bilateral diagnostic mammogram and left breast ultrasound.  Recommended self breast exam.  Pap and HR HPV as above. Cervical polyp to pathology.  Discussed Calcium, Vitamin D, regular exercise program including cardiovascular and weight bearing exercise. Labs performed.  Yes.   POC Hgb.  Urine micro and culture.  Will have patient return for pelvic ultrasound to evaluate menorrhagia. Refills given on medications.  No..    Follow up annually and prn.   Additional counseling given regarding left breast mass, menorrhagia, fibroids, and abnormal urine.   After visit summary provided.

## 2015-03-29 NOTE — Progress Notes (Signed)
Patient is scheduled for Bilateral Breast Diagnostic Mammogram and L Breast Ultrasound at [facility] on 03/30/15 at 1530 . Patient agreeable to time/date/location.

## 2015-03-29 NOTE — Patient Instructions (Signed)

## 2015-03-30 ENCOUNTER — Ambulatory Visit
Admission: RE | Admit: 2015-03-30 | Discharge: 2015-03-30 | Disposition: A | Discharge status: Home / Self Care | Payer: Federal, State, Local not specified - PPO | Source: Ambulatory Visit | Attending: Obstetrics and Gynecology | Admitting: Obstetrics and Gynecology

## 2015-03-30 DIAGNOSIS — N6320 Unspecified lump in the left breast, unspecified quadrant: Secondary | ICD-10-CM

## 2015-03-31 ENCOUNTER — Telehealth: Payer: Self-pay

## 2015-03-31 LAB — URINE CULTURE
COLONY COUNT: NO GROWTH
Organism ID, Bacteria: NO GROWTH

## 2015-03-31 NOTE — Telephone Encounter (Signed)
Left message to call Janice Zuniga at [phone]. 

## 2015-03-31 NOTE — Telephone Encounter (Signed)
-----   Message from Nunzio Cobbs, MD sent at 03/31/2015  2:19 PM EDT ----- Please inform of negative urine culture.  Cc- Marisa Sprinkles

## 2015-03-31 NOTE — Telephone Encounter (Signed)
Spoke with patient. Advised of results as seen below from [place]. Patient is agreeable and verbalizes understanding.  Routing to provider for final review. Patient agreeable to disposition. Will close encounter.

## 2015-03-31 NOTE — Telephone Encounter (Signed)
Patient returning call.

## 2015-04-03 LAB — HEMOGLOBIN, FINGERSTICK: Hemoglobin, fingerstick: 10.4 g/dL — ABNORMAL LOW (ref 12.0–16.0)

## 2015-04-04 LAB — IPS OTHER TISSUE BIOPSY

## 2015-04-05 LAB — IPS PAP TEST WITH HPV

## 2015-04-06 ENCOUNTER — Telehealth: Payer: Self-pay | Admitting: Obstetrics and Gynecology

## 2015-04-06 NOTE — Telephone Encounter (Signed)
Patient requesting to see Dr. Quincy Simmonds for "heavy cycles."

## 2015-04-06 NOTE — Telephone Encounter (Signed)
Return call to patient. States she talked with Dr Quincy Simmonds briefly about her heavy cycles at annual exam but is really interested in more detailed discussion. States she has noticed that she has upper and lower back pain and body stiffness prior to cycle. Menses are really interfering with quality of life. Requests office visit. Appointment tomorrow with Dr Quincy Simmonds at 1000.  Routing to provider for final review. Patient agreeable to disposition. Will close encounter.

## 2015-04-07 ENCOUNTER — Ambulatory Visit (INDEPENDENT_AMBULATORY_CARE_PROVIDER_SITE_OTHER): Payer: Federal, State, Local not specified - PPO | Admitting: Obstetrics and Gynecology

## 2015-04-07 ENCOUNTER — Encounter: Payer: Self-pay | Admitting: Obstetrics and Gynecology

## 2015-04-07 VITALS — BP 112/66 | HR 84 | Resp 16 | Ht 63.0 in | Wt 99.0 lb

## 2015-04-07 DIAGNOSIS — N920 Excessive and frequent menstruation with regular cycle: Secondary | ICD-10-CM

## 2015-04-07 DIAGNOSIS — N943 Premenstrual tension syndrome: Secondary | ICD-10-CM | POA: Diagnosis not present

## 2015-04-07 NOTE — Progress Notes (Signed)
GYNECOLOGY  VISIT   HPI: 44 y.o.   Married  [place] American  female   [phone] with Patient's last menstrual period was 03/10/2015 (exact date).   here for Follow up - Problems with Menstrual Period     History of heavy menstrual bleeding and anemia.  Painful.  Tried chaste Tree [place] and uncertain if it is working well. Aleve helps.  History of fibroids.  Hgb 10.1 with Dr. Melvyn Novas 8 months ago.   Having premenstrual stiffness and pain.  Back and neck pain.  Seems to be premenstrual.  Has cramping.  Some emotional changes also. When symptoms begin, her symptoms resolve.   When she flows, "It is like a river." Feels like she looses a week out of the month.   Took birth control pills in the past and did well.   GYNECOLOGIC HISTORY: Patient's last menstrual period was 03/10/2015 (exact date). Contraception: Vasectomy  Menopausal hormone therapy: None  Last mammogram: 03/30/15 BIRADS6:BIRADS2:Benign  Last pap smear: 03/31/15 Neg. HR HPV:neg        OB History    Gravida Para Term Preterm AB TAB SAB Ectopic Multiple Living   5 3 3       3          Patient Active Problem List   Diagnosis Date Noted  . 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 Dyspnea 07/11/2014  . Upper airway cough syndrome 07/11/2014  . Iron deficiency anemia 07/11/2014  . HLA B27 (HLA B27 positive) 07/06/2014  . Low serum vitamin D   . Non-celiac gluten sensitivity   . Spondyloarthritis     Past Medical History  Diagnosis Date  . Chicken pox   . UTI (lower urinary tract infection)   . Low serum vitamin D   . Non-celiac gluten sensitivity   . Spondyloarthritis   . Abnormal uterine bleeding   . Anemia   . Genital warts   . History of chlamydia   . HSV-1 (herpes simplex virus 1) infection     Past Surgical History  Procedure Laterality Date  . None    . Mouth surgery      cyst in mouth  . Wisdom tooth extraction    . Colonoscopy      Current Outpatient Prescriptions  Medication Sig  Dispense Refill  . Chaste Tree 20 MG TABS Take by mouth daily.    . Cholecalciferol (VITAMIN D PO) Take 1 tablet by mouth daily.    . mometasone-formoterol (DULERA) 100-5 MCG/ACT AERO Inhale 2 puffs into the lungs 2 (two) times daily. 1 Inhaler 0  . Polysaccharide Iron Complex (IRON UP) 15 MG/0.5ML LIQD As directed twice per day    . PROAIR HFA 108 (90 BASE) MCG/ACT inhaler Inhale 2 puffs into the lungs every 4 (four) hours as needed.     No current facility-administered medications for this visit.     ALLERGIES: Peanut-containing drug products  Family History  Problem Relation Age of Onset  . Hypertension Mother   . Diabetes Mother     ? father  . Cancer Paternal Grandmother     ? type  . Diabetes Father     History   Social History  . Marital Status: Married    Spouse Name: N/A  . Number of Children: N/A  . Years of Education: N/A   Occupational History  . Not on file.   Social History Main Topics  . Smoking status: Never Smoker   . Smokeless tobacco: Never Used  . Alcohol  Use: 1.0 oz/week    2 Standard drinks or equivalent per week  . Drug Use: No  . Sexual Activity:    Partners: Male   Other Topics Concern  . 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 [name]. [name].    Got 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  PHYSICAL EXAMINATION:    BP 112/66 mmHg  Pulse 84  Resp 16  Ht 5' 3"  (1.6 m)  Wt 99 lb (44.906 kg)  BMI 17.54 kg/m2  LMP 03/10/2015 (Exact Date)    General appearance: alert, cooperative and appears stated age  ASSESSMENT  Menorrhagia.  Anemia - chronic.  Fibroids by history.  PMS.  PLAN  Comprehensive discussion regarding evaluation and treatment of menorrhagia and fibroids.   Recommend return for pelvic ultrasound, sonohysterogram, and EMB.  Discussed medical, procedural, and surgical care for menorrhagia and fibroids including OCPS/NuvaRing/Ortho Evra, Mirena, Depo Provera, Ablation, uterine artery embolization, hysterectomy.  Discussed treatment of PMS symptoms with oral contraceptives and SSRIs.  Patient most interested in combined OCPs.   An After Visit Summary was printed and given to the patient.  ___25___ minutes face to face time of which over 50% was spent in counseling.

## 2015-04-11 ENCOUNTER — Ambulatory Visit
Admission: RE | Admit: 2015-04-11 | Discharge: 2015-04-11 | Disposition: A | Discharge status: Home / Self Care | Payer: Federal, State, Local not specified - PPO | Source: Ambulatory Visit | Attending: Family Medicine | Admitting: Family Medicine

## 2015-04-11 ENCOUNTER — Other Ambulatory Visit: Payer: Self-pay | Admitting: Family Medicine

## 2015-04-11 DIAGNOSIS — M25551 Pain in right hip: Secondary | ICD-10-CM

## 2015-04-12 ENCOUNTER — Telehealth: Payer: Self-pay | Admitting: Obstetrics and Gynecology

## 2015-04-12 NOTE — Telephone Encounter (Signed)
Called patient to review benefits for procedure. Left voicemail to call back and review. °

## 2015-04-24 DIAGNOSIS — R935 Abnormal findings on diagnostic imaging of other abdominal regions, including retroperitoneum: Secondary | ICD-10-CM | POA: Insufficient documentation

## 2015-04-24 DIAGNOSIS — K921 Melena: Secondary | ICD-10-CM | POA: Insufficient documentation

## 2015-04-27 ENCOUNTER — Ambulatory Visit (INDEPENDENT_AMBULATORY_CARE_PROVIDER_SITE_OTHER): Payer: Federal, State, Local not specified - PPO

## 2015-04-27 ENCOUNTER — Ambulatory Visit (INDEPENDENT_AMBULATORY_CARE_PROVIDER_SITE_OTHER): Payer: Federal, State, Local not specified - PPO | Admitting: Obstetrics and Gynecology

## 2015-04-27 ENCOUNTER — Encounter: Payer: Self-pay | Admitting: Obstetrics and Gynecology

## 2015-04-27 ENCOUNTER — Other Ambulatory Visit: Payer: Self-pay | Admitting: Obstetrics and Gynecology

## 2015-04-27 VITALS — BP 114/70 | HR 72 | Resp 14 | Ht 63.0 in | Wt 94.0 lb

## 2015-04-27 DIAGNOSIS — N920 Excessive and frequent menstruation with regular cycle: Secondary | ICD-10-CM

## 2015-04-27 DIAGNOSIS — R9389 Abnormal findings on diagnostic imaging of other specified body structures: Secondary | ICD-10-CM

## 2015-04-27 DIAGNOSIS — D259 Leiomyoma of uterus, unspecified: Secondary | ICD-10-CM | POA: Diagnosis not present

## 2015-04-27 DIAGNOSIS — R938 Abnormal findings on diagnostic imaging of other specified body structures: Secondary | ICD-10-CM

## 2015-04-27 NOTE — Progress Notes (Signed)
Subjective  44 y.o. G5P3003 married [place] American female here for pelvic ultrasound for  Menorrhagia and suspected uterine fibroid on pelvic exam.  Patient with anemia.  Taking iron from [place] - 29 mg elemental iron po bid.   Having PMS symptoms and joint pains.   Vasectomy for contraception.  Took OCPs, Ortho Evra, and Depo Provera in the past.   Tired of vaginal bleeding and not feeling well.  Patient is having GI evaluation for rectal bleeding and cramping.  Had CT scan at [facility] on 04/16/15 and inflammation of the colon was seen.  Rx for prednisone dose pack.  Dr. Virgia [place] at [facility] will do colonoscopy Sept. 15 for the GI bleeding.   Objective  Pelvic ultrasound images and report reviewed with patient.  Uterus - multiple fibroids - 0.5 - 3.7 cm. EMS - 17.38 mm Ovaries - normal and left ovary with 11 mm CL cyst.  Free fluid - yes - mild to mod echo free fluid.     Procedure - sonohysterogram Consent performed. Speculum placed in vagina. Sterile prep of cervix with   betadine Cannula placed inside endometrial cavity without difficulty. Speculum removed. Sterile saline injected.     2 possible         filling defects noted - 18 mm and 24 mm - endometrial polyps versus generalized thickened endometrium. Cannula removed. No complication.   Procedure - endometrial biopsy Consent performed. Speculum place in vagina.  Sterile prep of cervix with betadine. Tenaculum to anterior cervical lip. Paracervical block with 10 cc 1% lidocaine _______________ no Pipelle placed to   8      cm without difficulty twice. Tissue obtained and sent to pathology. Speculum removed.  No complications. Minimal EBL.  Assessment   Multifibroid uterus.  Largest fibroid is posterior.  Thickened endometrium with possible polyps. Anemia.  PMS. Permanent female contraception.  Recent GI bleeding/inflammation of colon.  Joint pain.   Plan  Increase iron to  FeSO4 325 mg po bid.  Follow up EMB.  Discussion of fibroids.  Discussion of treatment for menorrhagia and fibroids - medical therapy - OCPs, NuvaRing, OrthoEvra, Depo Provera, hysteroscopy/dilation and curettage/endometrial ablation, uterine artery embolization, hysterectomy. Patient is leaning toward hysterectomy.  Laparoscopic robotic approach discussed with bilateral salpingectomy.  ACOG handouts on hysterectomy and DaVinci information on robotic hysterectomy. Complete colonoscopy to rule out Crohn's.  ___25____ minutes face to face time of which over 50% was spent in counseling.   After visit summary to patient.

## 2015-04-27 NOTE — Patient Instructions (Addendum)
Endometrial Biopsy, Care After Refer to this sheet in the next few weeks. These instructions provide you with information on caring for yourself after your procedure. Your health care provider may also give you more specific instructions. Your treatment has been planned according to current medical practices, but problems sometimes occur. Call your health care provider if you have any problems or questions after your procedure. WHAT TO EXPECT AFTER THE PROCEDURE After your procedure, it is typical to have the following:  You may have mild cramping and a small amount of vaginal bleeding for a few days after the procedure. This is normal. HOME CARE INSTRUCTIONS  Only take over-the-counter or prescription medicine as directed by your health care provider.  Do not douche, use tampons, or have sexual intercourse until your health care provider approves.  Follow your health care provider's instructions regarding any activity restrictions, such as strenuous exercise or heavy lifting. SEEK MEDICAL CARE IF:  You have heavy bleeding or bleeding longer than 2 days after the procedure.  You have bad smelling drainage from your vagina.  You have a fever and chills.  Youhave severe lower stomach (abdominal) pain. SEEK IMMEDIATE MEDICAL CARE IF:  You have severe cramps in your stomach or back.  You pass large blood clots.  Your bleeding increases.  You become weak or lightheaded, or you pass out. Document Released: 06/30/2013 Document Reviewed: 06/30/2013 [facility] Patient Information 2015 [place], [place]. This information is not intended to replace advice given to you by your health care provider. Make sure you discuss any questions you have with your health care provider.  Uterine Fibroid A uterine fibroid is a growth (tumor) that occurs in your uterus. This type of tumor is not cancerous and does not spread out of the uterus. You can have one or many fibroids. Fibroids can vary in size,  weight, and where they grow in the uterus. Some can become quite large. Most fibroids do not require medical treatment, but some can cause pain or heavy bleeding during and between periods. CAUSES  A fibroid is the result of a single uterine cell that keeps growing (unregulated), which is different than most cells in the human body. Most cells have a control mechanism that keeps them from reproducing without control.  SIGNS AND SYMPTOMS   Bleeding.  Pelvic pain and pressure.  Bladder problems due to the size of the fibroid.  Infertility and miscarriages depending on the size and location of the fibroid. DIAGNOSIS  Uterine fibroids are diagnosed through a physical exam. Your health care provider may feel the lumpy tumors during a pelvic exam. Ultrasonography may be done to get information regarding size, location, and number of tumors.  TREATMENT   Your health care provider may recommend watchful waiting. This involves getting the fibroid checked by your health care provider to see if it grows or shrinks.   Hormone treatment or an intrauterine device (IUD) may be prescribed.   Surgery may be needed to remove the fibroids (myomectomy) or the uterus (hysterectomy). This depends on your situation. When fibroids interfere with fertility and a woman wants to become pregnant, a health care provider may recommend having the fibroids removed.  [place] care depends on how you were treated. In general:   Keep all follow-up appointments with your health care provider.   Only take over-the-counter or prescription medicines as directed by your health care provider. If you were prescribed a hormone treatment, take the hormone medicines exactly as directed. Do not  take aspirin. It can cause bleeding.   Talk to your health care provider about taking iron pills.  If your periods are troublesome but not so heavy, lie down with your feet raised slightly above your heart. Place  cold packs on your lower abdomen.   If your periods are heavy, write down the number of pads or tampons you use per month. Bring this information to your health care provider.   Include green vegetables in your diet.  SEEK IMMEDIATE MEDICAL CARE IF:  You have pelvic pain or cramps not controlled with medicines.   You have a sudden increase in pelvic pain.   You have an increase in bleeding between and during periods.   You have excessive periods and soak tampons or pads in a half hour or less.  You feel lightheaded or have fainting episodes. Document Released: 09/06/2000 Document Revised: 06/30/2013 Document Reviewed: 04/08/2013 [facility] Patient Information 2015 [place], [place]. This information is not intended to replace advice given to you by your health care provider. Make sure you discuss any questions you have with your health care provider.  Try iron sulfate or iron gluconate 325 twice a day.  Colace 100 mg daily will help with any constipation.

## 2015-05-01 LAB — IPS OTHER TISSUE BIOPSY

## 2015-05-03 ENCOUNTER — Other Ambulatory Visit: Payer: Self-pay | Admitting: Obstetrics and Gynecology

## 2015-05-03 DIAGNOSIS — D500 Iron deficiency anemia secondary to blood loss (chronic): Secondary | ICD-10-CM

## 2015-05-05 ENCOUNTER — Telehealth: Payer: Self-pay

## 2015-05-05 MED ORDER — LEVONORGEST-ETH ESTRAD 91-DAY 0.1-0.02 & 0.01 MG PO TABS: 1.0000 | ORAL_TABLET | Freq: Every day | ORAL | Status: DC

## 2015-05-05 NOTE — Telephone Encounter (Signed)
-----   Message from Nunzio Cobbs, MD sent at 05/03/2015  9:08 PM EDT ----- Please inform patient of her negative endometrial biopsy.  I am recommending a repeat CBC in beginning of September at a minimum.  Please make a lab appointment if patient does not already have one.  Let me know how I can help further.  Patient was considering hysterectomy.  Cc- Marisa Sprinkles

## 2015-05-05 NOTE — Telephone Encounter (Signed)
Spoke with patient. Advised of results as seen below from [place]. Patient verbalizes understanding. Patient states that she would like to have a hysterectomy but is concerned she will still have inflammatory symptoms when it is time for her cycle. "She mentioned some people will still have PMS symptoms and I did not know if my inflammatory symptoms would still occur." States before her cycle she has a really stiff back and shoulders. Per patient has a history of problems with inflammation and has to be on Prednisone to control. Patient also asking what kind of birth control Dr.Silva recommends to best control her bleeding. Advised will speak with Dr.Silva and return call with further recommendations. Patient is agreeable.

## 2015-05-05 NOTE — Telephone Encounter (Signed)
Spoke with patient. Advised of message as seen below from [place]. Patient is agreeable and verbalizes understanding. Rx for [facility] sent to pharmacy on file. 3 month recheck scheduled for 08/02/2015 at 3:30pm with Dr.Silva. Patient si agreeable to date and time.  Routing to provider for final review. Patient agreeable to disposition. Will close encounter.

## 2015-05-05 NOTE — Telephone Encounter (Signed)
I would suggest LoSeasonique.  This is continuous contraception so patient will have menses only once every 3 months.  During the last week of the three months, the patient will take an estrogen pill instead of placebo.   She may do really well with keeping her hormones very consistent.   Please send to pharmacy of choice - 90 days with one refill.  Please schedule a recheck with me for 3 months.

## 2015-05-25 LAB — HM COLONOSCOPY

## 2015-06-26 ENCOUNTER — Telehealth: Payer: Self-pay | Admitting: Obstetrics and Gynecology

## 2015-06-26 ENCOUNTER — Other Ambulatory Visit: Payer: Self-pay | Admitting: Obstetrics and Gynecology

## 2015-06-26 DIAGNOSIS — D500 Iron deficiency anemia secondary to blood loss (chronic): Secondary | ICD-10-CM

## 2015-06-26 NOTE — Telephone Encounter (Signed)
Spoke with patient. Advised of message as seen below from Janice Zuniga. Patient is agreeable. Appointment scheduled for tomorrow 10/4 at 3:45 pm. Agreeable to date and time.  Routing to provider for final review. Patient agreeable to disposition. Will close encounter.

## 2015-06-26 NOTE — Telephone Encounter (Signed)
The patient had a hgb of 9.1 at the end of July. It is normal to have irregular bleeding the first 3 months of a new pill, particularly a continuous pill. Given her anemia and concerns about blood loss, lets have her come in for a CBC and Ferritin, then we can make an educated recommendation. I will place the orders.

## 2015-06-26 NOTE — Telephone Encounter (Signed)
Patient has questions for the nurse regarding her birth control medication.

## 2015-06-26 NOTE — Telephone Encounter (Signed)
Spoke with patient. Patient is currently taking Loseasonique birth control. States that since she has been taking it she has had light spotting. Over the last two weeks she has been sick and has not been able to take her pill at the same time daily. States she takes it within 30 minutes of the same time. Has began to have increased spotting. Is wearing a "heavy" pad that she changes once per day. Advised patient that with new start on birth control it can take her body up to 3 months to adjust and irregular bleeding is not uncommon. Patient is concerned as she is anemic. Is currently taking FeS04 343m BID. Patient is scheduled for follow up with Dr.Silva on 08/02/2015 to recheck iron levels and discuss possible hysterectomy. "I am not sure if I should try another birth control to hopefully not have bleeding and reduce my anemia or just wait." Advised I will speak with covering provider and return call with further recommendations. Patient is agreeable.

## 2015-06-27 ENCOUNTER — Other Ambulatory Visit (INDEPENDENT_AMBULATORY_CARE_PROVIDER_SITE_OTHER): Payer: Federal, State, Local not specified - PPO

## 2015-06-27 DIAGNOSIS — D500 Iron deficiency anemia secondary to blood loss (chronic): Secondary | ICD-10-CM

## 2015-06-27 LAB — FERRITIN: FERRITIN: 15 ng/mL (ref 10–291)

## 2015-06-27 LAB — CBC
HCT: 32.9 % — ABNORMAL LOW (ref 36.0–46.0)
HEMOGLOBIN: 10.4 g/dL — AB (ref 12.0–15.0)
MCH: 22.2 pg — ABNORMAL LOW (ref 26.0–34.0)
MCHC: 31.6 g/dL (ref 30.0–36.0)
MCV: 70.1 fL — ABNORMAL LOW (ref 78.0–100.0)
MPV: 8.8 fL (ref 8.6–12.4)
Platelets: 537 10*3/uL — ABNORMAL HIGH (ref 150–400)
RBC: 4.69 MIL/uL (ref 3.87–5.11)
RDW: 16.9 % — ABNORMAL HIGH (ref 11.5–15.5)
WBC: 9.9 10*3/uL (ref 4.0–10.5)

## 2015-06-28 ENCOUNTER — Telehealth: Payer: Self-pay

## 2015-06-28 NOTE — Telephone Encounter (Signed)
-----   Message from Salvadore Dom, MD sent at 06/28/2015  8:47 AM EDT ----- Please inform the patient that her hgb is 10.4, up from 9.1 in July. She should continue on her iron and OCP's and f/u with Dr Quincy Simmonds next month. I will forward this note to Dr Quincy Simmonds as well.

## 2015-06-28 NOTE — Telephone Encounter (Signed)
Please have patient come in for an appointment to discuss hysterectomy.  I have time open for tomorrow morning.

## 2015-06-28 NOTE — Telephone Encounter (Signed)
Spoke with patient. Advised of results and message as seen below from [place]. Patient is agreeable. Patient states that she has decided she would like to proceed with a hysterectomy as previously discussed with Dr.Silva. "I know my hemoglobin level was a concern for proceeding with surgery, but she told me to tell her as soon as I made a decision for planning." Patient has a current recheck appointment scheduled with Dr.Silva on 08/02/2015. Advised I will speak with Dr.Silva regarding her decision and further recommendations in case there need to be any adjustments to her appointment. Patient is agreeable.

## 2015-06-28 NOTE — Telephone Encounter (Signed)
Spoke with patient. Advised of message as seen below from [place]. Patient is unavailable tomorrow for an appointment. Appointment scheduled for 10/10 at 1 pm with Dr.Silva. Agreeable to date and time.  Routing to provider for final review. Patient agreeable to disposition. Will close encounter.

## 2015-07-03 ENCOUNTER — Encounter: Payer: Self-pay | Admitting: *Deleted

## 2015-07-03 ENCOUNTER — Ambulatory Visit (INDEPENDENT_AMBULATORY_CARE_PROVIDER_SITE_OTHER): Payer: Federal, State, Local not specified - PPO | Admitting: Obstetrics and Gynecology

## 2015-07-03 ENCOUNTER — Telehealth: Payer: Self-pay | Admitting: Obstetrics and Gynecology

## 2015-07-03 ENCOUNTER — Encounter: Payer: Self-pay | Admitting: Obstetrics and Gynecology

## 2015-07-03 VITALS — BP 108/80 | HR 88 | Ht 63.0 in | Wt 94.8 lb

## 2015-07-03 DIAGNOSIS — D259 Leiomyoma of uterus, unspecified: Secondary | ICD-10-CM

## 2015-07-03 DIAGNOSIS — N920 Excessive and frequent menstruation with regular cycle: Secondary | ICD-10-CM

## 2015-07-03 DIAGNOSIS — D500 Iron deficiency anemia secondary to blood loss (chronic): Secondary | ICD-10-CM

## 2015-07-03 NOTE — Progress Notes (Signed)
Patient ID: Janice Zuniga, female   DOB: August 28, 1971, 44 y.o.   MRN: [id number] GYNECOLOGY  VISIT   HPI: 44 y.o.   Married  Janice Zuniga  female   [phone] with Patient's last menstrual period was 05/06/2015 (approximate).   here to discuss hysterectomy.  Patient having irregular cycles even with OCPs.   Having to use a tampon and a pad.   Some clotting.  Patient is on LoSeasonique.  Having some increased pain, not constant, not one side or the other.  No dizziness or lightheadedness.  Declines future childbearing.   Evaluation on 04/27/15: Pelvic ultrasound: Uterus - multiple fibroids - 0.5 - 3.7 cm. EMS - 17.38 mm Ovaries - normal and left ovary with 11 mm CL cyst.  Free fluid - yes - mild to mod echo free fluid.  Saline ultrasound:  2 possiblefilling defects noted - 18 mm and 24 mm - endometrial polyps versus generalized thickened endometrium.  EMB:  BENIGN SECRETORY PHASE ENDOMETRIUM.  -NO EVIDENCE OF ENDOMETRIAL HYPERPLASIA OR MALIGNANCY   Had colonoscopy done - had biopsy showing mild colitis.  Had colon ulcerations. Now is on Asacol.  Has follow up in November 2016 with [facility] - Dr. Virgia Land.   Hx anemia - Hgb 10.4 one week ago.  Taking iron regularly about 60 mg daily.   GYNECOLOGIC HISTORY: Patient's last menstrual period was 05/06/2015 (approximate). Contraception:OCPs--Camrese Menopausal hormone therapy: none Last mammogram: 03-29-15 Bil.Diag.Density Cat.C/Benign Left breast Cysts,BiRads2/Screening [facility] Last pap smear: 03-31-15 Neg:Neg HR HPV        OB History    Gravida Para Term Preterm AB TAB SAB Ectopic Multiple Living   _0 Patient Active Problem List   Diagnosis Date Noted  . 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 Dyspnea 07/11/2014  . Upper airway cough syndrome 07/11/2014  . Iron deficiency anemia 07/11/2014  . HLA B27 (HLA B27 positive) 07/06/2014  . Low serum vitamin D   . Non-celiac gluten  sensitivity   . Spondyloarthritis     Past Medical History  Diagnosis Date  . Chicken pox   . UTI (lower urinary tract infection)   . Low serum vitamin D   . Non-celiac gluten sensitivity   . Spondyloarthritis   . Abnormal uterine bleeding   . Anemia   . Genital warts   . History of chlamydia   . HSV-1 (herpes simplex virus 1) infection   . Colitis   . Reactive airway disease     Past Surgical History  Procedure Laterality Date  . None    . Mouth surgery      cyst in mouth  . Wisdom tooth extraction    . Colonoscopy      Current Outpatient Prescriptions  Medication Sig Dispense Refill  . Levonorgestrel-Ethinyl Estradiol (LOSEASONIQUE) 0.1-0.02 & 0.01 MG tablet Take 1 tablet by mouth daily. 1 Package 0  . Mesalamine (ASACOL HD) 800 MG TBEC Take 1,600 mg by mouth 2 (two) times daily.    . mometasone-formoterol (DULERA) 100-5 MCG/ACT AERO Inhale 2 puffs into the lungs 2 (two) times daily. 1 Inhaler 0  . PROAIR HFA 108 (90 BASE) MCG/ACT inhaler Inhale 2 puffs into the lungs every 4 (four) hours as needed.     No current facility-administered medications for this visit.     ALLERGIES: Peanut-containing drug products  Family History  Problem Relation Age of Onset  .  Hypertension Mother   . Diabetes Mother     ? father  . Cancer Paternal Grandmother     ? type  . Diabetes Father     Social History   Social History  . Marital Status: Married    Spouse Name: N/A  . Number of Children: N/A  . Years of Education: N/A   Occupational History  . Not on file.   Social History Main Topics  . Smoking status: Never Smoker   . Smokeless tobacco: Never Used  . Alcohol Use: 1.2 oz/week    2 Standard drinks or equivalent per week  . Drug Use: No  . Sexual Activity:    Partners: Male    Birth Control/ Protection: OCP     Comment: Camrese   Other Topics Concern  . 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 Janice Zuniga '95. Janice 03' Zuniga  05'.    Got 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  PHYSICAL EXAMINATION:    BP 108/80 mmHg  Pulse 88  Ht _0  (1.6 m)  Wt 94 lb 12.8 oz (43.001 kg)  BMI 16.80 kg/m2  LMP 05/06/2015 (Approximate)    General appearance: alert, cooperative and appears stated age Abdomen: soft, non-tender; no masses,  no organomegaly   Pelvic: External genitalia:  no lesions              Urethra:  normal appearing urethra with no masses, tenderness or lesions              Bartholins and Skenes: normal                 Vagina: normal appearing vagina with normal color and discharge, no lesions              Cervix: no lesions           Bimanual Exam:  Uterus:  enlarged, 10 weeks size, 3.5 cm posterior lower uterine segment fibroid.              Adnexa: normal adnexa and no mass, fullness, tenderness              Rectovaginal: Yes.  .  Confirms.              Anus:  normal sphincter tone, no lesions  Chaperone was present for exam.  ASSESSMENT  Symptomatic fibroids with menorrhagia and anemia.  On Lo-Seasonique.  Recent diagnosis of colitis.  Celiac sprue.  PLAN  Counseled regarding uterine fibroids and other treatment options including stronger dosage of combined OCPs, Depo Provera, and hysterectomy.  Previously discussed dilation and curettage and uterine artery embolization.  Focused discussion on robotic laparoscopic hysterectomy with bilateral salpingectomy and cystoscopy.  Benefits and risks reviewed.   Risks include but are not limited to bleeding, infection, damage to surrounding organs, reaction to anesthesia, pneumonia, DVT, PE, death, hernia formation, neuropathy, need for reoperation. Surgical expectations and recovery discussed. Patient wishes to proceed forward in surgical  planning.  An After Visit Summary was printed and given to the patient.  __40____ minutes face to face time of which over 50% was spent in counseling.

## 2015-07-03 NOTE — Telephone Encounter (Signed)
Needs anesthesia consultation.  Has reactive airway and uses Dulera prn.  Thanks.

## 2015-07-03 NOTE — Telephone Encounter (Signed)
Call to patient to discuss surgery scheduling policy and date options. Patient desires to proceed with October 24 date. Surgery instruction sheet reviewed, printed copy mailed, see scanned copy. Patient requests note for employer stating that she will have surgery and estimated recovery time. States she does not need FMLA paperwork, only a note.Requests note faxed to her at [phone] as soon as possible. Patient states she is on Ultrainflamx/nutritional supplement for Chron's patients/ to reduce inflammation. She states it has herbals products and Tumeric in it so she is instructed to discontinue this until after the surgery. Patient states this really helps her and is concerned about what to do if symptoms return while she is off of medication. Advised to bring this with her to consult appointment with Dr Quincy Simmonds on 07-06-15 to discuss further. Advised needs to discontiue all ASA, NSAIDS, fish oil, Vitamin E and herbal products for two weeks prior to surgery.  Dr Quincy Simmonds, any additional instructions?

## 2015-07-03 NOTE — Telephone Encounter (Signed)
Called patient to review benefits for surgery. Left voicemail to call back and review.

## 2015-07-04 NOTE — Telephone Encounter (Signed)
Call to [facility] at central scheduling, Anesthesia consult requested. Encounter closed.

## 2015-07-05 ENCOUNTER — Telehealth: Payer: Self-pay | Admitting: *Deleted

## 2015-07-05 NOTE — Telephone Encounter (Signed)
-----   Message from Nunzio Cobbs, MD sent at 07/05/2015 12:56 PM EDT ----- Regarding: RE: surgery orders I would recommend just the clear liquids only following regular breakfast on the day before surgery.  No bowel prep.  No enema.  Thank you,   [place] ----- Message -----    From: Huey Romans, RN    Sent: 07/03/2015   6:29 PM      To: Nunzio Cobbs, MD Subject: surgery orders                                 Surgery scheduled for 07-17-15. Office pre-op on 07-06-15. 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 Please advise. She is the second case on 07-17-15 so I told her she could have breakfast on the day before then start clear liquids.  Needs surgery orders.  Thank you.

## 2015-07-05 NOTE — Telephone Encounter (Signed)
Call to patient. Advised per Dr Elza Rafter instructions that she does not need the magnesium bowel prep that was originally discussed in surgery instructions. Advised to proceed with clear liquids after breakfast.  Encounter closed.

## 2015-07-06 ENCOUNTER — Telehealth: Payer: Self-pay | Admitting: Obstetrics and Gynecology

## 2015-07-06 ENCOUNTER — Ambulatory Visit (INDEPENDENT_AMBULATORY_CARE_PROVIDER_SITE_OTHER): Payer: Federal, State, Local not specified - PPO | Admitting: Obstetrics and Gynecology

## 2015-07-06 ENCOUNTER — Encounter: Payer: Self-pay | Admitting: Obstetrics and Gynecology

## 2015-07-06 VITALS — BP 110/78 | HR 70 | Ht 63.0 in | Wt 96.0 lb

## 2015-07-06 DIAGNOSIS — D259 Leiomyoma of uterus, unspecified: Secondary | ICD-10-CM | POA: Diagnosis not present

## 2015-07-06 NOTE — Telephone Encounter (Signed)
Letter reprinted and sent to your desk for signature.

## 2015-07-06 NOTE — Progress Notes (Signed)
Patient ID: Janice Zuniga, female   DOB: 28-Oct-1970, 44 y.o.   MRN: [id number] GYNECOLOGY  VISIT   HPI: 44 y.o.   Married  [place] American  female   [phone] with Patient's last menstrual period was 05/06/2015 (approximate).   here for surgical consult.    Planning hysterectomy for symptomatic uterine fibroids.   Patient having irregular cycles even with OCPs.  Having to use a tampon and a pad.  Some clotting.  Patient is on LoSeasonique.  Having some increased pain, not constant, not one side or the other.  No dizziness or lightheadedness.  Declines future childbearing.   Evaluation on 04/27/15: Pelvic ultrasound: Uterus - multiple fibroids - 0.5 - 3.7 cm. EMS - 17.38 mm Ovaries - normal and left ovary with 11 mm CL cyst.  Free fluid - yes - mild to mod echo free fluid.  Saline ultrasound: 2 possiblefilling defects noted - 18 mm and 24 mm - endometrial polyps versus generalized thickened endometrium.  EMB: BENIGN SECRETORY PHASE ENDOMETRIUM.  -NO EVIDENCE OF ENDOMETRIAL HYPERPLASIA OR MALIGNANCY   Hx anemia - Hgb 10.4 on 07/07/15. Taking iron regularly about 60 mg daily.   Had colonoscopy done - had biopsy showing mild colitis.  Had colon ulcerations. Now is on Asacol.  Has follow up in November 2016 with [facility] - Dr. Virgia Land.   GYNECOLOGIC HISTORY: Patient's last menstrual period was 05/06/2015 (approximate). Contraception:OCP--Camrese Menopausal hormone therapy: n/a Last mammogram: 03-29-15 Bil.Diag.Density Cat.C/Benign Left breast cysts;[facility]. Last pap smear: 03-31-15 Neg:Neg HR HPV        OB History    Gravida Para Term Preterm AB TAB SAB Ectopic Multiple Living   _0 Patient Active Problem List   Diagnosis Date Noted  . 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 Dyspnea 07/11/2014  . Upper airway cough syndrome 07/11/2014  . Iron deficiency anemia 07/11/2014  . HLA B27 (HLA B27  positive) 07/06/2014  . Low serum vitamin D   . Non-celiac gluten sensitivity   . Spondyloarthritis     Past Medical History  Diagnosis Date  . Chicken pox   . UTI (lower urinary tract infection)   . Low serum vitamin D   . Non-celiac gluten sensitivity   . Spondyloarthritis   . Abnormal uterine bleeding   . Anemia   . Genital warts   . History of chlamydia   . HSV-1 (herpes simplex virus 1) infection   . Colitis   . Reactive airway disease     Past Surgical History  Procedure Laterality Date  . None    . Mouth surgery      cyst in mouth  . Wisdom tooth extraction    . Colonoscopy      Current Outpatient Prescriptions  Medication Sig Dispense Refill  . Cholecalciferol (VITAMIN D3) 10000 UNITS TABS Take 1 tablet by mouth 2 (two) times daily.    . Ferrous Fumarate (IRON) 18 MG TBCR Take 4 tablets by mouth 2 (two) times daily.    . Lactobacillus (PROBIOTIC ACIDOPHILUS PO) Take 1 tablet by mouth daily.    . Levonorgestrel-Ethinyl Estradiol (LOSEASONIQUE) 0.1-0.02 & 0.01 MG tablet Take 1 tablet by mouth daily. 1 Package 0  . Mesalamine (ASACOL HD) 800 MG TBEC Take 1,600 mg by mouth 2 (two) times daily.    . mometasone-formoterol (DULERA) 100-5 MCG/ACT AERO Inhale 2 puffs into the lungs  2 (two) times daily. 1 Inhaler 0  . Multiple Vitamins-Minerals (HM MULTIVITAMIN ADULT GUMMY PO) Take 3 each by mouth daily.    Marland Kitchen PROAIR HFA 108 (90 BASE) MCG/ACT inhaler Inhale 2 puffs into the lungs every 4 (four) hours as needed.    Marland Kitchen OVER THE COUNTER MEDICATION Take 1 Dose by mouth 2 (two) times daily. Ultrainflamx- nutritional supplement for Crohn's patients/ patients with inflammatory bowel issues     No current facility-administered medications for this visit.     ALLERGIES: Codeine and Peanut-containing drug products  Family History  Problem Relation Age of Onset  . Hypertension Mother   . Diabetes Mother     ? father  . Cancer Paternal Grandmother     ? type  . Diabetes Father      Social History   Social History  . Marital Status: Married    Spouse Name: N/A  . Number of Children: N/A  . Years of Education: N/A   Occupational History  . Not on file.   Social History Main Topics  . Smoking status: Never Smoker   . Smokeless tobacco: Never Used  . Alcohol Use: 1.2 oz/week    2 Standard drinks or equivalent per week  . Drug Use: No  . Sexual Activity:    Partners: Male    Birth Control/ Protection: OCP     Comment: Camrese   Other Topics Concern  . 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 [name]. [name].    Got B[employer]S. 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  PHYSICAL EXAMINATION:    BP 110/78 mmHg  Pulse 70  Ht _0  (1.6 m)  Wt 96 lb (43.545 kg)  BMI 17.01 kg/m2  LMP 05/06/2015 (Approximate)    General appearance: alert, cooperative and appears stated age Head: Normocephalic, without obvious abnormality, atraumatic Neck: no adenopathy, supple, symmetrical, trachea midline and thyroid normal to inspection and palpation Lungs: clear to auscultation bilaterally  Heart: regular rate and rhythm Abdomen: thin, soft, non-tender; no masses,  no organomegaly Extremities: extremities normal, atraumatic, no cyanosis or edema Skin: Skin color, texture, turgor normal. No rashes or lesions Lymph nodes: Cervical, supraclavicular, and axillary nodes normal. No abnormal inguinal nodes palpated Neurologic: Grossly normal  Pelvic:  Done 07/03/15:               External genitalia: no lesions  Urethra: normal appearing urethra with no masses, tenderness or lesions  Bartholins and Skenes: normal   Vagina: normal appearing vagina with normal color and discharge, no  lesions  Cervix: no lesions   Bimanual Exam: Uterus: enlarged, 10 weeks size, 3.5 cm posterior lower uterine segment fibroid.  Adnexa: normal adnexa and no mass, fullness, tenderness  Rectovaginal: Yes. . Confirms.  Anus: normal sphincter tone, no lesions  ASSESSMENT  Symptomatic fibroids.  Anemia.  HLA B27 positive. Spondyloarthritis. Colitis.   PLAN  Plan for robotic hysterectomy with bilateral salpingectomy, cystoscopy, possible total abdominal hysterectomy with bilateral salpingectomy (due to possible patient preference). The patient has a very small petite frame, and this means that her laparoscopic incisions will need to be higher on her abdominal wall to perform the surgery robotically.  We did discuss the differences between doing laparoscopic robotic surgery and a Pfannenstiel incision.   She will let the office know if she prefers to do the open procedure.  Risks, benefits, and alternatives discussed with the patient who wishes to proceed.  Will continue with OCPs until surgery so that her anemia remains more controlled.  No bowel prep needed.   An After Visit Summary was printed and given to the patient.

## 2015-07-06 NOTE — Telephone Encounter (Signed)
Patient calling she said that Gay Filler had faxed over a surgery letter to her employer and they need that letter to be signed personally by Dr. Quincy Simmonds. Employer's fax # [phone] Best contact # for patient: [phone]

## 2015-07-06 NOTE — Telephone Encounter (Signed)
Letter signed

## 2015-07-07 NOTE — Patient Instructions (Addendum)
Your procedure is scheduled on:  Monday, July 17, 2015  Enter through the Main Entrance of [facility] at: 9:45 a.m.  Pick up the phone at the desk and dial [phone].  Call this number if you have problems the morning of surgery: [phone].  Remember: Do NOT eat food or drink after: MIDNIGHT Sunday, July 16, 2015 Take these medicines the morning of surgery with a SIP OF WATER: NONE  *Bring asthma inhaler day of surgery  Do NOT wear jewelry (body piercing), metal hair clips/bobby pins, make-up, or nail polish. Do NOT wear lotions, powders, or perfumes.  You may wear deoderant. Do NOT shave for 48 hours prior to surgery. Do NOT bring valuables to the hospital. Contacts, dentures, or bridgework may not be worn into surgery. Leave suitcase in car.  After surgery it may be brought to your room.  For patients admitted to the hospital, checkout time is 11:00 AM the day of discharge.

## 2015-07-10 ENCOUNTER — Encounter (HOSPITAL_COMMUNITY)
Admission: RE | Admit: 2015-07-10 | Discharge: 2015-07-10 | Disposition: A | Discharge status: Home / Self Care | Payer: Federal, State, Local not specified - PPO | Source: Ambulatory Visit | Attending: Obstetrics and Gynecology | Admitting: Obstetrics and Gynecology

## 2015-07-10 ENCOUNTER — Encounter (HOSPITAL_COMMUNITY): Payer: Self-pay

## 2015-07-10 DIAGNOSIS — Z01818 Encounter for other preprocedural examination: Secondary | ICD-10-CM | POA: Diagnosis not present

## 2015-07-10 LAB — BASIC METABOLIC PANEL
Anion gap: 2 — ABNORMAL LOW (ref 5–15)
BUN: 8 mg/dL (ref 6–20)
CALCIUM: 8.3 mg/dL — AB (ref 8.9–10.3)
CHLORIDE: 107 mmol/L (ref 101–111)
CO2: 27 mmol/L (ref 22–32)
CREATININE: 0.77 mg/dL (ref 0.44–1.00)
Glucose, Bld: 102 mg/dL — ABNORMAL HIGH (ref 65–99)
Potassium: 3.9 mmol/L (ref 3.5–5.1)
Sodium: 136 mmol/L (ref 135–145)

## 2015-07-10 LAB — CBC
HCT: 31.8 % — ABNORMAL LOW (ref 36.0–46.0)
Hemoglobin: 10 g/dL — ABNORMAL LOW (ref 12.0–15.0)
MCH: 23 pg — ABNORMAL LOW (ref 26.0–34.0)
MCHC: 31.4 g/dL (ref 30.0–36.0)
MCV: 73.1 fL — ABNORMAL LOW (ref 78.0–100.0)
Platelets: 397 10*3/uL (ref 150–400)
RBC: 4.35 MIL/uL (ref 3.87–5.11)
RDW: 21.3 % — AB (ref 11.5–15.5)
WBC: 7.3 10*3/uL (ref 4.0–10.5)

## 2015-07-11 ENCOUNTER — Telehealth: Payer: Self-pay | Admitting: *Deleted

## 2015-07-11 NOTE — Telephone Encounter (Signed)
See next phone message regarding today's incoming call. Current encounter completed and closed

## 2015-07-11 NOTE — Telephone Encounter (Signed)
Returned call to patient. Initially states that PCP called her last night to discuss lab tests she had sone several weeks ago. He had concerns about "putting her body through the stress" of surgery so patient has decided to cancel. Patient initially declined to explain what type of labs or what concerns PCP has. 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  Patient request office visit to discuss contraception. Appointment for Thursday 07-13-15 at 3pm. Advised that Dr Quincy Simmonds will need more specific information regarding patients medical condition before prescribing medication or contraceptive options. Given name of Dr Burney Gauze, hematologist in our area.  Central scheduling notified that case is canceled.  Routing to provider for final review.

## 2015-07-11 NOTE — Telephone Encounter (Signed)
Patient called at [facility] and left message with front desk that she wanted to cancel her surgery. States that her PCP in [place], Dr Thane Edu, recommended this due to lab values he was concerned about.

## 2015-07-11 NOTE — Telephone Encounter (Signed)
Patient called and said, "I want to cancel my scheduled hysterectomy for 07/17/15. My PCP in [place], Dr. Thane Edu, suggested this due to some new lab work he is concerned about."

## 2015-07-12 NOTE — Addendum Note (Signed)
Addended by: Yisroel Ramming, Lailana Shira E on: 07/12/2015 05:10 AM   Modules accepted: Level of Service

## 2015-07-12 NOTE — Telephone Encounter (Signed)
Thank you for the update.  Encounter closed. 

## 2015-07-13 ENCOUNTER — Encounter: Payer: Self-pay | Admitting: Obstetrics and Gynecology

## 2015-07-13 ENCOUNTER — Ambulatory Visit (INDEPENDENT_AMBULATORY_CARE_PROVIDER_SITE_OTHER): Payer: Federal, State, Local not specified - PPO | Admitting: Obstetrics and Gynecology

## 2015-07-13 VITALS — BP 130/86 | HR 64 | Resp 14 | Wt 95.0 lb

## 2015-07-13 DIAGNOSIS — R350 Frequency of micturition: Secondary | ICD-10-CM

## 2015-07-13 DIAGNOSIS — D259 Leiomyoma of uterus, unspecified: Secondary | ICD-10-CM

## 2015-07-13 LAB — POCT URINALYSIS DIPSTICK
BILIRUBIN UA: NEGATIVE
GLUCOSE UA: NEGATIVE
KETONES UA: NEGATIVE
Leukocytes, UA: NEGATIVE
Nitrite, UA: NEGATIVE
Protein, UA: NEGATIVE
Urobilinogen, UA: NEGATIVE
pH, UA: 6.5

## 2015-07-13 MED ORDER — NITROFURANTOIN MONOHYD MACRO 100 MG PO CAPS: ORAL_CAPSULE | ORAL | Status: DC

## 2015-07-13 MED ORDER — LEVONORGEST-ETH ESTRAD 91-DAY 0.15-0.03 MG PO TABS: 1.0000 | ORAL_TABLET | Freq: Every day | ORAL | Status: DC

## 2015-07-13 NOTE — Progress Notes (Signed)
Patient ID: Janice Zuniga, female   DOB: December 18, 1970, 44 y.o.   MRN: [id number] GYNECOLOGY  VISIT   HPI: 44 y.o.   Married   [place] American  female   [phone] with Patient's last menstrual period was 05/06/2015 (approximate).   here to discuss birth control options and urinary frequency.    Hysterectomy cancelled.  States she was feeling anxious about having surgery.  Saw her PCP in [place]. Told she had high protein level which gave some concern for potential bone marrow disease - myeloma? Patient does not have copies of the blood work yet.  Awaiting consultation with hematology/oncology in [place].   Needs a new alternative for controlling her heavy cycles and known fibroids. Had a negative endometrial biopsy.  Bleeds all the time. Bleeding through the OCPs.  Currently on LoSeasonique.  Having urinary urgency. Has been self treating for UTI with herbal option - Vibrance - has blueberry and cranberry and goldenrod, dandelion, and parsley in it.  Has cramping and nausea.  No fevers or back pain.  Some bloating.   Urine dip - positive for RBCs but is on menstrual cycle.   GYNECOLOGIC HISTORY: Patient's last menstrual period was 05/06/2015 (approximate). Contraception:OCP Menopausal hormone therapy: N/A Last mammogram: 03-30-15- [facility] - WNL Last pap smear: 03-31-15 WNL NEG HR HPV         OB History    Gravida Para Term Preterm AB TAB SAB Ectopic Multiple Living   5 3 3       3          Patient Active Problem List   Diagnosis Date Noted  . Low calcium levels 07/14/2015  . 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 Dyspnea 07/11/2014  . Upper airway cough syndrome 07/11/2014  . Iron deficiency anemia 07/11/2014  . HLA B27 (HLA B27 positive) 07/06/2014  . Low serum vitamin D   . Non-celiac gluten sensitivity   . Spondyloarthritis     Past Medical History  Diagnosis Date  . Chicken pox   . UTI (lower urinary tract infection)   . Low serum vitamin D   .  Non-celiac gluten sensitivity   . Spondyloarthritis   . Abnormal uterine bleeding   . Anemia   . Genital warts   . History of chlamydia   . HSV-1 (herpes simplex virus 1) infection   . Colitis   . Reactive airway disease   . Low calcium levels     Past Surgical History  Procedure Laterality Date  . None    . Mouth surgery      cyst in mouth  . Wisdom tooth extraction    . Colonoscopy      Current Outpatient Prescriptions  Medication Sig Dispense Refill  . Cholecalciferol (VITAMIN D3) 10000 UNITS TABS Take 1 tablet by mouth 2 (two) times daily.    . Ferrous Fumarate (IRON) 18 MG TBCR Take 4 tablets by mouth 2 (two) times daily.    . Lactobacillus (PROBIOTIC ACIDOPHILUS PO) Take 1 tablet by mouth daily.    . Mesalamine (ASACOL HD) 800 MG TBEC Take 1,600 mg by mouth 2 (two) times daily.    . mometasone-formoterol (DULERA) 100-5 MCG/ACT AERO Inhale 2 puffs into the lungs 2 (two) times daily. 1 Inhaler 0  . Multiple Vitamins-Minerals (HM MULTIVITAMIN ADULT GUMMY PO) Take 3 each by mouth daily.    Marland Kitchen OVER THE COUNTER MEDICATION Take 1 Dose by mouth 2 (two) times daily. Ultrainflamx- nutritional supplement for Crohn's patients/ patients with  inflammatory bowel issues    . PROAIR HFA 108 (90 BASE) MCG/ACT inhaler Inhale 2 puffs into the lungs every 4 (four) hours as needed.    Marland Kitchen levonorgestrel-ethinyl estradiol (SEASONALE,INTROVALE,JOLESSA) 0.15-0.03 MG tablet Take 1 tablet by mouth daily. 1 Package 3  . nitrofurantoin, macrocrystal-monohydrate, (MACROBID) 100 MG capsule Take one capsule by mouth twice a day for one week. 14 capsule 0   No current facility-administered medications for this visit.     ALLERGIES: Codeine and Peanut-containing drug products  Family History  Problem Relation Age of Onset  . Hypertension Mother   . Diabetes Mother     ? father  . Cancer Paternal Grandmother     ? type  . Diabetes Father     Social History   Social History  . Marital Status:  Married    Spouse Name: N/A  . Number of Children: N/A  . Years of Education: N/A   Occupational History  . Not on file.   Social History Main Topics  . Smoking status: Never Smoker   . Smokeless tobacco: Never Used  . Alcohol Use: 1.2 oz/week    2 Standard drinks or equivalent per week  . Drug Use: No  . Sexual Activity:    Partners: Male    Birth Control/ Protection: OCP     Comment: Camrese   Other Topics Concern  . 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 [name]. [name].    Got B[employer]S. 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  PHYSICAL EXAMINATION:    BP 130/86 mmHg  Pulse 64  Resp 14  Wt 95 lb (43.092 kg)  LMP 05/06/2015 (Approximate)    General appearance: alert, cooperative and appears stated age   ASSESSMENT  Symptomatic fibroids and anemia.  Not responding to low dose continuous OCPs. Low calcium.  ?blood cell dyscrasia?  PLAN  Counseled regarding options for treating bleeding - change to a regular dose OCP, Depo Provera, Depo Lupron, uterine artery embolization.  Stop LoSeasonique.  Will do Seasonale OCPs.  Told she does not have to withdraw at all to have a menses and that she may skip all of the placebo pills, but that she may have breakthrough bleeding.  Will check PTH.  Urine culture. Will treat with Macrobid 100 mg po bid for 7 days.  Recheck in 6 weeks. Patient will have consultation with Dr. Marin Olp as soon as her records are transferred to him.  An After Visit Summary was printed and given to the patient.  _25_____ minutes face to face time of which over 50% was spent in counseling.

## 2015-07-14 ENCOUNTER — Encounter: Payer: Self-pay | Admitting: Obstetrics and Gynecology

## 2015-07-14 LAB — PARATHYROID HORMONE, INTACT (NO CA): PTH: 30 pg/mL (ref 14–64)

## 2015-07-16 LAB — URINE CULTURE: Colony Count: 30000

## 2015-07-17 ENCOUNTER — Ambulatory Visit (HOSPITAL_COMMUNITY)
Admission: RE | Admit: 2015-07-17 | Payer: Federal, State, Local not specified - PPO | Source: Ambulatory Visit | Admitting: Obstetrics and Gynecology

## 2015-07-17 ENCOUNTER — Telehealth: Payer: Self-pay

## 2015-07-17 ENCOUNTER — Encounter (HOSPITAL_COMMUNITY): Admission: RE | Payer: Self-pay | Source: Ambulatory Visit

## 2015-07-17 SURGERY — ROBOTIC ASSISTED TOTAL HYSTERECTOMY WITH SALPINGECTOMY
Anesthesia: General

## 2015-07-17 NOTE — Telephone Encounter (Signed)
Spoke with patient. Advised of results as seen below from [facility]. Patient is agreeable and states that she feels much better. Will complete course of antibiotic.  Patient states that last week she faxed her lab results over for Dr.Silva's review from her PCP for a referral to a hematologist.  Asking if this referral has been placed. Advised I do not see that this has been placed yet, but I will send a message over to Dr.Silva for her review upon her return to the office on Wednesday. Patient is agreeable.  Cc: Dr.Silva

## 2015-07-17 NOTE — Telephone Encounter (Signed)
-----   Message from Janice Dom, MD sent at 07/17/2015  3:01 PM EDT ----- Please let the patient know she did have a UTI, sensitive to the macrobid

## 2015-07-20 ENCOUNTER — Other Ambulatory Visit: Payer: Self-pay | Admitting: Obstetrics and Gynecology

## 2015-07-20 DIAGNOSIS — R771 Abnormality of globulin: Secondary | ICD-10-CM

## 2015-07-20 NOTE — Telephone Encounter (Signed)
Records from patient's PCP scanned into EPIC. Spoke with Seth Bake at [facility] office. Venora Maples has access to all notes and labs including scanned labs for review. Venora Maples will have these reviewed by the medical staff and call the patient directly to schedule the patient's appointment. Spoke with patient. Advised referral has been placed and she will be contacted directly by Dr.Ennever's office to schedule an appointment. Patient is agreeable.  Routing to provider for final review. Patient agreeable to disposition. Will close encounter.

## 2015-07-20 NOTE — Telephone Encounter (Signed)
I have placed a referral to Dr. Marin Olp for elevated globulins.  Patient sent [place] reports from her PCP.  Please assist in getting these records to Dr. Marin Olp.  Can they be scanned into EPIC?

## 2015-07-24 ENCOUNTER — Ambulatory Visit: Payer: Federal, State, Local not specified - PPO | Admitting: Obstetrics and Gynecology

## 2015-07-31 ENCOUNTER — Other Ambulatory Visit: Payer: Self-pay | Admitting: Family

## 2015-07-31 ENCOUNTER — Ambulatory Visit (HOSPITAL_BASED_OUTPATIENT_CLINIC_OR_DEPARTMENT_OTHER): Payer: Federal, State, Local not specified - PPO | Admitting: Family

## 2015-07-31 ENCOUNTER — Ambulatory Visit: Payer: Federal, State, Local not specified - PPO

## 2015-07-31 ENCOUNTER — Encounter: Payer: Self-pay | Admitting: Family

## 2015-07-31 ENCOUNTER — Other Ambulatory Visit (HOSPITAL_BASED_OUTPATIENT_CLINIC_OR_DEPARTMENT_OTHER): Payer: Federal, State, Local not specified - PPO

## 2015-07-31 VITALS — BP 126/79 | HR 73 | Temp 97.8°F | Resp 16 | Ht 63.0 in | Wt 97.0 lb

## 2015-07-31 DIAGNOSIS — D509 Iron deficiency anemia, unspecified: Secondary | ICD-10-CM

## 2015-07-31 DIAGNOSIS — R768 Other specified abnormal immunological findings in serum: Secondary | ICD-10-CM | POA: Diagnosis not present

## 2015-07-31 DIAGNOSIS — D649 Anemia, unspecified: Secondary | ICD-10-CM

## 2015-07-31 DIAGNOSIS — R799 Abnormal finding of blood chemistry, unspecified: Secondary | ICD-10-CM

## 2015-07-31 LAB — CBC WITH DIFFERENTIAL (CANCER CENTER ONLY)
BASO#: 0 10*3/uL (ref 0.0–0.2)
BASO%: 0.5 % (ref 0.0–2.0)
EOS ABS: 0 10*3/uL (ref 0.0–0.5)
EOS%: 0.6 % (ref 0.0–7.0)
HCT: 35.8 % (ref 34.8–46.6)
HEMOGLOBIN: 11.8 g/dL (ref 11.6–15.9)
LYMPH#: 1.7 10*3/uL (ref 0.9–3.3)
LYMPH%: 26.5 % (ref 14.0–48.0)
MCH: 24.6 pg — AB (ref 26.0–34.0)
MCHC: 33 g/dL (ref 32.0–36.0)
MCV: 75 fL — ABNORMAL LOW (ref 81–101)
MONO#: 0.4 10*3/uL (ref 0.1–0.9)
MONO%: 5.3 % (ref 0.0–13.0)
NEUT%: 67.1 % (ref 39.6–80.0)
NEUTROS ABS: 4.4 10*3/uL (ref 1.5–6.5)
PLATELETS: 382 10*3/uL (ref 145–400)
RBC: 4.79 10*6/uL (ref 3.70–5.32)
RDW: 21.3 % — ABNORMAL HIGH (ref 11.1–15.7)
WBC: 6.6 10*3/uL (ref 3.9–10.0)

## 2015-07-31 LAB — CHCC SATELLITE - SMEAR

## 2015-07-31 NOTE — Progress Notes (Signed)
Hematology/Oncology Consultation   Name: SYNETHIA ENDICOTT      MRN: [id number]    Location: Room/bed info not found  Date: 07/31/2015 Time:4:23 PM   REFERRING PHYSICIAN: Nunzio Cobbs, MD   REASON FOR CONSULT: Elevated serum globulin level   DIAGNOSIS: 1. Elevated serum globulin level 2. Iron deficiency anemia  HISTORY OF PRESENT ILLNESS: Ms. Sampley is avery pleasant 44 yo African American female with history of iron deficiency anemia and recent M-spike of 0.27 g/dl on blood work while in [place]. She moved to [place] last year for work but has kept her PCP and Rheumatologist in [place] until she finds one here. I gave her the information for [facility] downstairs.  She has a history of heavy cycles. She was scheduled for a hysterectomy but decided to cancel and try birth control. She is now on Seasonale and her cycles have been much lighter and less frequent. She is taking an oral iron supplement.  She has a history of being HLA B27 positive and spondyloarthritis. She has some occasional neck and joint pain with this.    She had a colonoscopy in September after having stool positive for blood. This showed active colitis possibly from her intake of NSAIDs for joint pain. This was negative for malignancy. She has had no more episodes of blood in her stool.  No personal cancer history. Family history includes her father who was recently diagnosed with prostate cancer.  No sickle cell trait or disease in the family.  She denies fatigue, fever, chills, n/v, cough, rash, dizziness, SOB, chest pain, palpitations, abdominal pain or changes in bowel or bladder habits.  She exercises regularly and eats healthy. She is on a strict gluten free diet. She stays well hydrated and denies any significant weight loss or gain.   No swelling or tenderness in her extremities. She occasionally has numbness and tingling in her fingertips. This comes and goes.  She has 3 children all of whom are healthy. She  had no miscarriages.  She works as an Public librarian. She enjoys her job and has had no chemical exposures that she is aware of.    ROS: All other 10 point review of systems is negative.   PAST MEDICAL HISTORY:   Past Medical History  Diagnosis Date  . Chicken pox   . UTI (lower urinary tract infection)   . Low serum vitamin D   . Non-celiac gluten sensitivity   . Spondyloarthritis   . Abnormal uterine bleeding   . Anemia   . Genital warts   . History of chlamydia   . HSV-1 (herpes simplex virus 1) infection   . Colitis   . Reactive airway disease   . Low calcium levels     ALLERGIES: Allergies  Allergen Reactions  . Codeine Nausea And Vomiting    sensitivity  . Peanut-Containing Drug Products     By allergy test...has eaten peanuts without issues       MEDICATIONS:  Current Outpatient Prescriptions on File Prior to Visit  Medication Sig Dispense Refill  . Cholecalciferol (VITAMIN D3) 10000 UNITS TABS Take 1 tablet by mouth 2 (two) times daily.    . Ferrous Fumarate (IRON) 18 MG TBCR Take 4 tablets by mouth 2 (two) times daily.    . Lactobacillus (PROBIOTIC ACIDOPHILUS PO) Take 1 tablet by mouth daily.    Marland Kitchen levonorgestrel-ethinyl estradiol (SEASONALE,INTROVALE,JOLESSA) 0.15-0.03 MG tablet Take 1 tablet by mouth daily. 1 Package 3  . Mesalamine (ASACOL  HD) 800 MG TBEC Take 1,600 mg by mouth 2 (two) times daily.    . mometasone-formoterol (DULERA) 100-5 MCG/ACT AERO Inhale 2 puffs into the lungs 2 (two) times daily. 1 Inhaler 0  . Multiple Vitamins-Minerals (HM MULTIVITAMIN ADULT GUMMY PO) Take 3 each by mouth daily.    . nitrofurantoin, macrocrystal-monohydrate, (MACROBID) 100 MG capsule Take one capsule by mouth twice a day for one week. 14 capsule 0  . OVER THE COUNTER MEDICATION Take 1 Dose by mouth 2 (two) times daily. Ultrainflamx- nutritional supplement for Crohn's patients/ patients with inflammatory bowel issues    . PROAIR HFA 108 (90 BASE) MCG/ACT inhaler  Inhale 2 puffs into the lungs every 4 (four) hours as needed.     No current facility-administered medications on file prior to visit.     PAST SURGICAL HISTORY Past Surgical History  Procedure Laterality Date  . None    . Mouth surgery      cyst in mouth  . Wisdom tooth extraction    . Colonoscopy      FAMILY HISTORY: Family History  Problem Relation Age of Onset  . Hypertension Mother   . Diabetes Mother     ? father  . Cancer Paternal Grandmother     ? type  . Diabetes Father     SOCIAL HISTORY:  reports that she has never smoked. She has never used smokeless tobacco. She reports that she drinks about 1.2 oz of alcohol per week. She reports that she does not use illicit drugs.  PERFORMANCE STATUS: The patient's performance status is 0 - Asymptomatic  PHYSICAL EXAM: Most Recent Vital Signs: Blood pressure 126/79, pulse 73, temperature 97.8 F (36.6 C), temperature source Oral, resp. rate 16, height 5' 3"  (1.6 m), weight 97 lb (43.999 kg), last menstrual period 05/06/2015. BP 126/79 mmHg  Pulse 73  Temp(Src) 97.8 F (36.6 C) (Oral)  Resp 16  Ht 5' 3"  (1.6 m)  Wt 97 lb (43.999 kg)  BMI 17.19 kg/m2  LMP 05/06/2015 (Approximate)  General Appearance:    Alert, cooperative, no distress, appears stated age  Head:    Normocephalic, without obvious abnormality, atraumatic  Eyes:    PERRL, conjunctiva/corneas clear, EOM's intact, fundi    benign, both eyes        Throat:   Lips, mucosa, and tongue normal; teeth and gums normal  Neck:   Supple, symmetrical, trachea midline, no adenopathy;    thyroid:  no enlargement/tenderness/nodules; no carotid   bruit or JVD  Back:     Symmetric, no curvature, ROM normal, no CVA tenderness  Lungs:     Clear to auscultation bilaterally, respirations unlabored  Chest Wall:    No tenderness or deformity   Heart:    Regular rate and rhythm, S1 and S2 normal, no murmur, rub   or gallop     Abdomen:     Soft, non-tender, bowel sounds  active all four quadrants,    no masses, no organomegaly        Extremities:   Extremities normal, atraumatic, no cyanosis or edema  Pulses:   2+ and symmetric all extremities  Skin:   Skin color, texture, turgor normal, no rashes or lesions  Lymph nodes:   Cervical, supraclavicular, and axillary nodes normal  Neurologic:   CNII-XII intact, normal strength, sensation and reflexes    throughout   LABORATORY DATA:  Results for orders placed or performed in visit on 07/31/15 (from the past 48 hour(s))  CBC with  Differential [facility])     Status: Abnormal   Collection Time: 07/31/15  1:54 PM  Result Value Ref Range   WBC 6.6 3.9 - 10.0 10e3/uL   RBC 4.79 3.70 - 5.32 10e6/uL   HGB 11.8 11.6 - 15.9 g/dL   HCT 35.8 34.8 - 46.6 %   MCV 75 (L) 81 - 101 fL   MCH 24.6 (L) 26.0 - 34.0 pg   MCHC 33.0 32.0 - 36.0 g/dL   RDW 21.3 (H) 11.1 - 15.7 %   Platelets 382 145 - 400 10e3/uL   NEUT# 4.4 1.5 - 6.5 10e3/uL   LYMPH# 1.7 0.9 - 3.3 10e3/uL   MONO# 0.4 0.1 - 0.9 10e3/uL   Eosinophils Absolute 0.0 0.0 - 0.5 10e3/uL   BASO# 0.0 0.0 - 0.2 10e3/uL   NEUT% 67.1 39.6 - 80.0 %   LYMPH% 26.5 14.0 - 48.0 %   MONO% 5.3 0.0 - 13.0 %   EOS% 0.6 0.0 - 7.0 %   BASO% 0.5 0.0 - 2.0 %  [facility] - Smear     Status: None   Collection Time: 07/31/15  1:54 PM  Result Value Ref Range   Smear Result Smear Available       RADIOGRAPHY: No results found.     PATHOLOGY: None  ASSESSMENT/PLAN: Ms. Schooler is avery pleasant 44 yo African American female with history of iron deficiency anemia and recent M-spike of 0.27 g/dl. Her PCP in [place] was concerned with the possibility of myeloma. She is asymptomatic at this time.  Her Hgb is 11.8 with an MCV of 75. Whit cell differential is normal. We did check her for alpha thalassemia.  She has history of being HLA B27 positive and spondyloarthritis. Her M-spike could possibly be related to her inflammatory process. We rechecked her monoclonal antibody  studies today and results are pending.  We will see what her iron studies show. At this time she is on oral iron daily and will continue on this.   We will plan to see her back in 4 months for follow-up and to monitor her blood work.  All questions were answered. She will contact us with any problems, questions or concerns. We can certainly see her much sooner if necessary.  She was discussed with and also seen by Dr. Marin Olp and he is in agreement with the aforementioned.   [facility] M    Addendum:  I saw and examined patient with Sarah. I did look at her left smear. She had a few target cells. Suspect that she may have thalassemia which could account for the low MCV.  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 She certainly is at risk for this or for some other spondyloarthropathy because of the HLA  B 27 antigen in her blood.  Her immunoglobulin levels are okay. As such, an underlying plasma cell disorder would be highly unlikely.  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  We will see her iron studies show. I spent that she may have some degree of iron deficiency. She did have iron deficiency by the studies that were done a few weeks ago. Her ferritin was only 15 with iron saturation of 7%.  I don't see need for any kind of x-ray studies. She does not need a bone survey. I do not think she needs any  type of urine protein evaluation. She does not need a bone marrow test.  I think we'll probably get her back in 3-4 months. We will see what her M spike is today.  We spent about 45 minutes with she and her husband. Is very fun talking to them. They are from [place]. She has a very interesting job. 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  Lum Keas

## 2015-08-01 LAB — IRON AND TIBC CHCC
%SAT: 11 % — ABNORMAL LOW (ref 21–57)
Iron: 39 ug/dL — ABNORMAL LOW (ref 41–142)
TIBC: 354 ug/dL (ref 236–444)
UIBC: 315 ug/dL (ref 120–384)

## 2015-08-01 LAB — FERRITIN CHCC: FERRITIN: 16 ng/mL (ref 9–269)

## 2015-08-02 ENCOUNTER — Ambulatory Visit: Payer: Federal, State, Local not specified - PPO | Admitting: Obstetrics and Gynecology

## 2015-08-02 LAB — PROTEIN ELECTROPHORESIS, SERUM, WITH REFLEX
ALPHA-2-GLOBULIN: 0.8 g/dL (ref 0.5–0.9)
Albumin ELP: 3.1 g/dL — ABNORMAL LOW (ref 3.8–4.8)
Alpha-1-Globulin: 0.4 g/dL — ABNORMAL HIGH (ref 0.2–0.3)
BETA GLOBULIN: 0.5 g/dL (ref 0.4–0.6)
Beta 2: 0.5 g/dL (ref 0.2–0.5)
GAMMA GLOBULIN: 1.5 g/dL (ref 0.8–1.7)
TOTAL PROTEIN, SERUM ELECTROPHOR: 6.8 g/dL (ref 6.1–8.1)

## 2015-08-02 LAB — HEMOGLOBINOPATHY EVALUATION
HEMOGLOBIN OTHER: 0 %
HGB A2 QUANT: 2.9 % (ref 2.2–3.2)
HGB S QUANTITAION: 0 %
Hgb A: 97.1 % (ref 96.8–97.8)
Hgb F Quant: 0 % (ref 0.0–2.0)

## 2015-08-02 LAB — IGG, IGA, IGM
IGA: 359 mg/dL (ref 69–380)
IGM, SERUM: 181 mg/dL (ref 52–322)
IgG (Immunoglobin G), Serum: 1470 mg/dL (ref 690–1700)

## 2015-08-02 LAB — KAPPA/LAMBDA LIGHT CHAINS
Kappa free light chain: 2.13 mg/dL — ABNORMAL HIGH (ref 0.33–1.94)
Kappa:Lambda Ratio: 1.35 (ref 0.26–1.65)
Lambda Free Lght Chn: 1.58 mg/dL (ref 0.57–2.63)

## 2015-08-09 ENCOUNTER — Encounter: Payer: Self-pay | Admitting: Obstetrics and Gynecology

## 2015-08-09 ENCOUNTER — Ambulatory Visit (INDEPENDENT_AMBULATORY_CARE_PROVIDER_SITE_OTHER): Payer: Federal, State, Local not specified - PPO | Admitting: Obstetrics and Gynecology

## 2015-08-09 VITALS — BP 128/70 | HR 90 | Temp 98.2°F | Resp 20 | Ht 63.0 in | Wt 96.0 lb

## 2015-08-09 DIAGNOSIS — N939 Abnormal uterine and vaginal bleeding, unspecified: Secondary | ICD-10-CM

## 2015-08-09 DIAGNOSIS — N946 Dysmenorrhea, unspecified: Secondary | ICD-10-CM | POA: Diagnosis not present

## 2015-08-09 DIAGNOSIS — R195 Other fecal abnormalities: Secondary | ICD-10-CM | POA: Diagnosis not present

## 2015-08-09 DIAGNOSIS — D259 Leiomyoma of uterus, unspecified: Secondary | ICD-10-CM | POA: Diagnosis not present

## 2015-08-09 LAB — ALPHA-THALASSEMIA GENOTYPR

## 2015-08-09 NOTE — Progress Notes (Signed)
GYNECOLOGY  VISIT   HPI: 44 y.o.   Married  [place] American  female   [phone] with Patient's last menstrual period was 08/07/2015.   here for pelvic pain started 2 days ago.   Has known fibroids.   Had been doing well with not bleeding or pain on her current OCP. Then 4 days ago forgot her OCP and then took it late the next day also.  Started bleeding which is now progressively worse and having pain.  Missed work yesterday and today due to pain and bleeding.   Bleeding is heavy and changing pad every 3 - 4 hours. Clotting.  Not staining through clothing or sheets. Having significant cramping.  Unable to take NSAIDS due to ulcers in colon.  Took Tylenol and did not help.  Bowel movement also had something in it that looked strange to patient like a "worm." No blood in the stool.  No diarrhea.   Seen by heme oncology.  Told she does not have multiple myeloma. She will have follow up with them in March 2016.   UPT is negative.    GYNECOLOGIC HISTORY: Patient's last menstrual period was 08/07/2015. Contraception: OCP Menopausal hormone therapy: None Last mammogram: 03/30/15 [place] left BIRADS2:benign  Last pap smear: 03/31/15 Neg. HR HPV:neg        OB History    Gravida Para Term Preterm AB TAB SAB Ectopic Multiple Living   5 3 3       3          Patient Active Problem List   Diagnosis Date Noted  . Low calcium levels 07/14/2015  . 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 Dyspnea 07/11/2014  . Upper airway cough syndrome 07/11/2014  . Iron deficiency anemia 07/11/2014  . HLA B27 (HLA B27 positive) 07/06/2014  . Low serum vitamin D   . Non-celiac gluten sensitivity   . Spondyloarthritis     Past Medical History  Diagnosis Date  . Chicken pox   . UTI (lower urinary tract infection)   . Low serum vitamin D   . Non-celiac gluten sensitivity   . Spondyloarthritis   . Abnormal uterine bleeding   . Anemia   . Genital warts   . History of chlamydia   . HSV-1  (herpes simplex virus 1) infection   . Colitis   . Reactive airway disease   . Low calcium levels     Past Surgical History  Procedure Laterality Date  . None    . Mouth surgery      cyst in mouth  . Wisdom tooth extraction    . Colonoscopy      Current Outpatient Prescriptions  Medication Sig Dispense Refill  . Cholecalciferol (VITAMIN D3) 10000 UNITS TABS Take 1 tablet by mouth 2 (two) times daily.    . Ferrous Fumarate (IRON) 18 MG TBCR Take 4 tablets by mouth 2 (two) times daily.    . Lactobacillus (PROBIOTIC ACIDOPHILUS PO) Take 1 tablet by mouth daily.    Marland Kitchen levonorgestrel-ethinyl estradiol (SEASONALE,INTROVALE,JOLESSA) 0.15-0.03 MG tablet Take 1 tablet by mouth daily. 1 Package 3  . Mesalamine (ASACOL HD) 800 MG TBEC Take 1,600 mg by mouth 2 (two) times daily.    . mometasone-formoterol (DULERA) 100-5 MCG/ACT AERO Inhale 2 puffs into the lungs 2 (two) times daily. 1 Inhaler 0  . Multiple Vitamins-Minerals (HM MULTIVITAMIN ADULT GUMMY PO) Take 3 each by mouth daily.    Marland Kitchen OVER THE COUNTER MEDICATION Take 1 Dose by mouth 2 (two) times  daily. Ultrainflamx- nutritional supplement for Crohn's patients/ patients with inflammatory bowel issues    . PROAIR HFA 108 (90 BASE) MCG/ACT inhaler Inhale 2 puffs into the lungs every 4 (four) hours as needed.    Marland Kitchen acetaminophen (TYLENOL) 325 MG tablet Take 650 mg by mouth every 6 (six) hours as needed.     No current facility-administered medications for this visit.     ALLERGIES: Codeine and Peanut-containing drug products  Family History  Problem Relation Age of Onset  . Hypertension Mother   . Diabetes Mother     ? father  . Cancer Paternal Grandmother     ? type  . Diabetes Father     Social History   Social History  . Marital Status: Married    Spouse Name: N/A  . Number of Children: N/A  . Years of Education: N/A   Occupational History  . Not on file.   Social History Main Topics  . Smoking status: Never Smoker   .  Smokeless tobacco: Never Used  . Alcohol Use: 1.2 oz/week    2 Standard drinks or equivalent per week  . Drug Use: No  . Sexual Activity:    Partners: Male    Birth Control/ Protection: OCP     Comment: Camrese   Other Topics Concern  . 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 [name]. [name].    Got B.S. 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  PHYSICAL EXAMINATION:    BP 128/70 mmHg  Pulse 90  Temp(Src) 98.2 F (36.8 C) (Oral)  Resp 20  Ht 5' 3"  (1.6 m)  Wt 96 lb (43.545 kg)  BMI 17.01 kg/m2  LMP 08/07/2015    General appearance: alert, cooperative and appears stated age   Pelvic: External genitalia:  no lesions              Urethra:  normal appearing urethra with no masses, tenderness or lesions              Bartholins and Skenes: normal                 Vagina: normal appearing vagina with normal color and discharge, no lesions.  LUS fibroids palpable posteriorly.  Cervix is deviated to the patient's right side.                Cervix: no lesions.  Menstrual flow noted but no active bleeding.            Bimanual Exam:  Uterus:  enlarged,  10 weeks size.  Exam difficult due to patient discomfort.              Adnexa: no mass, fullness, tenderness.  Exam limited due to patient discomfort.                 Chaperone was present for exam.  ASSESSMENT  Menorrhagia.  Dysmenorrhea.  Known fibroids with anemia.  Abnormal stool finding by patient.   PLAN  Discussed fibroids as the likely etiology of her pain and bleeding but could be other diagnoses such as endometriosis, which is not possible to diagnose on pelvic ultrasound in most circumstances. Counseled  regarding fibroids - options of doubling up on OCPs and then  continuing once daily, Depo Provera, Depo Lupron, and hysterectomy again reviewed.  Options of laparoscopic versus abdominal hysterectomy.  Will double up on OCPs for the next 5 days.  She will reconsider her treatment options and discuss with her husband.  Stool for O and P. Return prn.    An After Visit Summary was printed and given to the patient.  __25____ minutes face to face time of which over 50% was spent in counseling.

## 2015-08-10 ENCOUNTER — Telehealth: Payer: Self-pay | Admitting: Obstetrics and Gynecology

## 2015-08-10 NOTE — Addendum Note (Signed)
Addended by: Charmayne Sheer on: 08/10/2015 02:21 PM   Modules accepted: Orders

## 2015-08-10 NOTE — Telephone Encounter (Signed)
Patient is trying to look at Dr. Elza Rafter availability for hysterectomy would like to go forward with surgery. [phone]

## 2015-08-10 NOTE — Telephone Encounter (Signed)
After review with Dr Quincy Simmonds, surgery scheduled for 08-21-15 at 1200 at [facility]. Patient advised and agreeable. Instructed to arrive at 10 am unless directed otherwise by hospital. Surgery instruction sheet reviewed and printed copy mailed to patient. See scanned copy.  Patient has questions about post operative pain medication management. Advised Dr Quincy Simmonds will discuss this with he at surgery consult on 08-15-15.  Routing to provider for final review. Patient agreeable to disposition. Will close encounter.

## 2015-08-10 NOTE — Telephone Encounter (Signed)
Return call to patient. Patient interested in proceeding with robotic assisted hysterectomy. Available date options discussed. Will review with Dr Quincy Simmonds and call patient back with surgery date.

## 2015-08-11 LAB — OVA AND PARASITE EXAMINATION: OP: NONE SEEN

## 2015-08-14 ENCOUNTER — Telehealth: Payer: Self-pay | Admitting: Obstetrics and Gynecology

## 2015-08-14 NOTE — Telephone Encounter (Signed)
OK to extend note for out of work from 11/15 - 11/18.  Pain and bleeding were significant enough for patient to make the decision for hysterectomy during that time.  She will need a note for out of work for 6 weeks post op recovery starting 08/21/15. Thank you.

## 2015-08-14 NOTE — Telephone Encounter (Signed)
Dr. Quincy Simmonds,  Okay to extend time off from work through 11/18 and create new letter for surgery?

## 2015-08-14 NOTE — Telephone Encounter (Signed)
Spoke with patient regarding benefit for surgery. Patient understands, agreeable and provided payment over the phone.   While on the phone patient stated she had two requests for work notes. Stated she was here recently and received note to cover work absence on 08/08/15-08/09/15. Patient states she remained out of work due to pain through 08/11/15 and needs a note covering 08/08/15 - 08/11/15.  Patient also requested a preemptive out of work note for upcoming surgery scheduled for 08/21/15. She states Dr Quincy Simmonds completed one when previously scheduled which included an estimated recovery time. Patient has appointment 08/15/15 for pre operative consult with Dr Quincy Simmonds. Patient agreeable to call from nurse with any questions.

## 2015-08-15 ENCOUNTER — Encounter: Payer: Self-pay | Admitting: Emergency Medicine

## 2015-08-15 ENCOUNTER — Encounter: Payer: Self-pay | Admitting: Obstetrics and Gynecology

## 2015-08-15 ENCOUNTER — Ambulatory Visit (INDEPENDENT_AMBULATORY_CARE_PROVIDER_SITE_OTHER): Payer: Federal, State, Local not specified - PPO | Admitting: Obstetrics and Gynecology

## 2015-08-15 VITALS — BP 98/70 | HR 78 | Resp 16 | Ht 63.0 in | Wt 98.0 lb

## 2015-08-15 DIAGNOSIS — N946 Dysmenorrhea, unspecified: Secondary | ICD-10-CM

## 2015-08-15 DIAGNOSIS — D259 Leiomyoma of uterus, unspecified: Secondary | ICD-10-CM | POA: Diagnosis not present

## 2015-08-15 NOTE — Telephone Encounter (Signed)
Patient notified that letters were ready for her pick up. Has consult today with Dr. Quincy Simmonds for pre-op.  Will pick up letters today.   Routing to provider for final review. Patient agreeable to disposition. Will close encounter.

## 2015-08-15 NOTE — Patient Instructions (Addendum)
Your procedure is scheduled on:  Monday, Nov. 28, 2016  Enter through the [employer] [facility] at:  10:15 AM  Pick up the phone at the desk and dial [phone].  Call this number if you have problems the morning of surgery: [phone].  Remember: Do NOT eat food or drink after:  Midnight Sunday Take these medicines the morning of surgery with a SIP OF WATER: None  *Bring asthma inhalers day of surgery  Do NOT wear jewelry (body piercing), metal hair clips/bobby pins, make-up, or nail polish. Do NOT wear lotions, powders, or perfumes.  You may wear deoderant. Do NOT shave for 48 hours prior to surgery. Do NOT bring valuables to the hospital. Contacts, dentures, or bridgework may not be worn into surgery. Leave suitcase in car.  After surgery it may be brought to your room.  For patients admitted to the hospital, checkout time is 11:00 AM the day of discharge.

## 2015-08-15 NOTE — Progress Notes (Signed)
GYNECOLOGY  VISIT   HPI: 44 y.o.   Married  [place] American  female   [phone] with Patient's last menstrual period was 08/07/2015.   here for  Pre Op for 08/21/15 ROBOTIC ASSISTED TOTAL HYSTERECTOMY WITH SALPINGECTOMY AND CYSTOSCOPY, POSSIBLE HYSTERECTOMY ABDOMINAL WITH BILATERAL SALPINGO OOPHORECTOMY AND     Has fibroids, dysmenorrhea and menorrhagia. On combined oral contraceptives and having bleeding outside of cycle time.  Patient declines future childbearing and is requesting hysterectomy.  Pelvic ultrasound on 04/27/15: Uterus - multiple fibroids - 0.5 - 3.7 cm. EMS - 17.38 mm Ovaries - normal and left ovary with 11 mm CL cyst.  Free fluid - yes - mild to mod echo free fluid.  Saline ultrasound: 2 possiblefilling defects noted - 18 mm and 24 mm - endometrial polyps versus generalized thickened endometrium.  EMB: BENIGN SECRETORY PHASE ENDOMETRIUM.  -NO EVIDENCE OF ENDOMETRIAL HYPERPLASIA OR MALIGNANCY   Surgery was scheduled for 07/17/15 and patient cancelled due to need for evaluation with medical oncology for elevated serum globulin.   Oncology has stated that she does not have multiple myeloma. Hgb 11.8 on 07/31/15.  On iron.   Has possible Crohn's disease. Taking Azacol.   Has preop visit a the hospital tomorrow.   GYNECOLOGIC HISTORY: Patient's last menstrual period was 08/07/2015. Contraception: OCP Menopausal hormone therapy: None Last mammogram: 03/30/15 [place] Left BIRADs2:Benign  Last pap smear: 03/31/15 Neg. HR HPV:neg        OB History    Gravida Para Term Preterm AB TAB SAB Ectopic Multiple Living   5 3 3       3          Patient Active Problem List   Diagnosis Date Noted  . Low calcium levels 07/14/2015  . 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 Dyspnea 07/11/2014  . Upper airway cough syndrome 07/11/2014  . Iron deficiency anemia 07/11/2014  . HLA B27 (HLA B27 positive) 07/06/2014  . Low serum vitamin D   . Non-celiac gluten sensitivity   .  Spondyloarthritis     Past Medical History  Diagnosis Date  . Chicken pox   . UTI (lower urinary tract infection)   . Low serum vitamin D   . Non-celiac gluten sensitivity   . Spondyloarthritis   . Abnormal uterine bleeding   . Anemia   . Genital warts   . History of chlamydia   . HSV-1 (herpes simplex virus 1) infection   . Colitis   . Reactive airway disease   . Low calcium levels     Past Surgical History  Procedure Laterality Date  . None    . Mouth surgery      cyst in mouth  . Wisdom tooth extraction    . Colonoscopy      Current Outpatient Prescriptions  Medication Sig Dispense Refill  . acetaminophen (TYLENOL) 325 MG tablet Take 650 mg by mouth every 6 (six) hours as needed for mild pain.     . Cholecalciferol (VITAMIN D3) 10000 UNITS TABS Take 1 tablet by mouth 2 (two) times daily.    . Digestive Enzyme CAPS Take 1 capsule by mouth 3 (three) times daily with meals.    . Ferrous Fumarate 29 MG TABS Take 3 tablets by mouth 2 (two) times daily.    Marland Kitchen levonorgestrel-ethinyl estradiol (SEASONALE,INTROVALE,JOLESSA) 0.15-0.03 MG tablet Take 1 tablet by mouth daily. 1 Package 3  . Mesalamine (ASACOL HD) 800 MG TBEC Take 1,600 mg by mouth 2 (two) times daily.    Marland Kitchen  mometasone-formoterol (DULERA) 100-5 MCG/ACT AERO Inhale 2 puffs into the lungs 2 (two) times daily. 1 Inhaler 0  . Multiple Vitamins-Minerals (HM MULTIVITAMIN ADULT GUMMY PO) Take 3 each by mouth daily.    Marland Kitchen PROAIR HFA 108 (90 BASE) MCG/ACT inhaler Inhale 2 puffs into the lungs every 4 (four) hours as needed.     No current facility-administered medications for this visit.     ALLERGIES: Codeine and Peanut-containing drug products  Family History  Problem Relation Age of Onset  . Hypertension Mother   . Diabetes Mother     ? father  . Cancer Paternal Grandmother     ? type  . Diabetes Father     Social History   Social History  . Marital Status: Married    Spouse Name: N/A  . Number of Children:  N/A  . Years of Education: N/A   Occupational History  . Not on file.   Social History Main Topics  . Smoking status: Never Smoker   . Smokeless tobacco: Never Used  . Alcohol Use: 1.2 oz/week    2 Standard drinks or equivalent per week  . Drug Use: No  . Sexual Activity:    Partners: Male    Birth Control/ Protection: OCP     Comment: Camrese   Other Topics Concern  . 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 [name]. [name].    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  PHYSICAL EXAMINATION:    BP 98/70 mmHg  Pulse 78  Resp 16  Ht 5' 3"  (1.6 m)  Wt 98 lb (44.453 kg)  BMI 17.36 kg/m2  LMP 08/07/2015    General appearance: alert, cooperative and appears stated age Head: Normocephalic, without obvious abnormality, atraumatic Neck: no adenopathy, supple, symmetrical, trachea midline and thyroid normal to inspection and palpation Lungs: clear to auscultation bilaterally  Heart: regular rate and rhythm Abdomen: soft, non-tender; bowel sounds normal; no masses,  no organomegaly Extremities: extremities normal, atraumatic, no cyanosis or edema Skin: Skin color, texture, turgor normal. No rashes or lesions Lymph nodes: Cervical, supraclavicular, and axillary nodes normal. No abnormal inguinal nodes palpated Neurologic: Grossly normal  Pelvic: External genitalia:  no lesions              Urethra:  normal appearing urethra with no masses, tenderness or lesions              Bartholins and Skenes: normal                 Vagina: normal appearing vagina with normal color and discharge, no lesions              Cervix: no lesions and cervix is off to the right of the vagina.                Bimanual Exam:  Uterus:   enlarged,  12 weeks size and irregular,  Fibroids are palpated posteriorly and uterine mobility is limited.              Adnexa: normal adnexa and no mass, fullness, tenderness  Rectovaginal: Yes.  .  Confirms.              Anus:  normal sphincter tone, no lesions  Chaperone was present for exam.  ASSESSMENT  Multifibroid uterus.  Menorrhagia and dysmenorrhea.  On combined OCPs.  PLAN  Discussion of hysterectomy - robotic total laparoscopic hysterectomy with bilateral salpingectomy and cystoscopy and possible total abdominal hysterectomy with possible bilateral salpingo-oophorectomy.  I have discussed risks, benefits, and alternative and the patient wishes to proceed.  We did spend a significant amount of time discussing the possibility of both an abdominal hysterectomy if the procedure cannot be completed laparoscopically and bilateral oophorectomy if the ovaries are abnormal or found significant endometriosis. We discussed the risks and benefits of oophorectomy including but not limited to cardiac effects, bone health, menopausal symptoms, and sexual functioning.  Patient understands that hormonal therapy is possible.  Surgical expectations and recovery discussed.  Magnesium citrate bowel prep also planned. Preop visit tomorrow at hospital.   An After Visit Summary was printed and given to the patient.  __25____ minutes face to face time of which over 50% was spent in counseling.

## 2015-08-16 ENCOUNTER — Encounter (HOSPITAL_COMMUNITY)
Admission: RE | Admit: 2015-08-16 | Discharge: 2015-08-16 | Disposition: A | Discharge status: Home / Self Care | Payer: Federal, State, Local not specified - PPO | Source: Ambulatory Visit | Attending: Obstetrics and Gynecology | Admitting: Obstetrics and Gynecology

## 2015-08-16 ENCOUNTER — Encounter (HOSPITAL_COMMUNITY): Payer: Self-pay

## 2015-08-16 DIAGNOSIS — Z01818 Encounter for other preprocedural examination: Secondary | ICD-10-CM | POA: Diagnosis present

## 2015-08-16 DIAGNOSIS — N939 Abnormal uterine and vaginal bleeding, unspecified: Secondary | ICD-10-CM | POA: Insufficient documentation

## 2015-08-16 DIAGNOSIS — D259 Leiomyoma of uterus, unspecified: Secondary | ICD-10-CM | POA: Diagnosis not present

## 2015-08-16 DIAGNOSIS — N946 Dysmenorrhea, unspecified: Secondary | ICD-10-CM | POA: Insufficient documentation

## 2015-08-16 HISTORY — DX: Cardiac arrhythmia, unspecified: I49.9

## 2015-08-16 LAB — COMPREHENSIVE METABOLIC PANEL
ALK PHOS: 54 U/L (ref 38–126)
ALT: 37 U/L (ref 14–54)
ANION GAP: 7 (ref 5–15)
AST: 26 U/L (ref 15–41)
Albumin: 3 g/dL — ABNORMAL LOW (ref 3.5–5.0)
BILIRUBIN TOTAL: 0.2 mg/dL — AB (ref 0.3–1.2)
BUN: 8 mg/dL (ref 6–20)
CALCIUM: 8.5 mg/dL — AB (ref 8.9–10.3)
CO2: 26 mmol/L (ref 22–32)
CREATININE: 0.66 mg/dL (ref 0.44–1.00)
Chloride: 100 mmol/L — ABNORMAL LOW (ref 101–111)
Glucose, Bld: 102 mg/dL — ABNORMAL HIGH (ref 65–99)
Potassium: 3.1 mmol/L — ABNORMAL LOW (ref 3.5–5.1)
Sodium: 133 mmol/L — ABNORMAL LOW (ref 135–145)
TOTAL PROTEIN: 7 g/dL (ref 6.5–8.1)

## 2015-08-16 LAB — CBC
HEMATOCRIT: 35.3 % — AB (ref 36.0–46.0)
HEMOGLOBIN: 11.8 g/dL — AB (ref 12.0–15.0)
MCH: 24.9 pg — AB (ref 26.0–34.0)
MCHC: 33.4 g/dL (ref 30.0–36.0)
MCV: 74.6 fL — AB (ref 78.0–100.0)
Platelets: 421 10*3/uL — ABNORMAL HIGH (ref 150–400)
RBC: 4.73 MIL/uL (ref 3.87–5.11)
RDW: 18.6 % — ABNORMAL HIGH (ref 11.5–15.5)
WBC: 9 10*3/uL (ref 4.0–10.5)

## 2015-08-20 ENCOUNTER — Encounter (HOSPITAL_COMMUNITY): Payer: Self-pay | Admitting: Anesthesiology

## 2015-08-20 ENCOUNTER — Encounter: Payer: Self-pay | Admitting: Obstetrics and Gynecology

## 2015-08-20 MED ORDER — DEXTROSE 5 % IV SOLN
2.0000 g | INTRAVENOUS | Status: DC
  Filled 2015-08-20: qty 2

## 2015-08-20 MED ORDER — DEXTROSE 5 % IV SOLN
2.0000 g | INTRAVENOUS | Status: AC
  Administered 2015-08-21: 2 g via INTRAVENOUS
  Filled 2015-08-20: qty 2

## 2015-08-20 NOTE — H&P (Signed)
Janice Cobbs, MD at 08/15/2015 2:25 PM     Status: Signed       Expand All Collapse All   GYNECOLOGY VISIT  HPI: 44 y.o. Married [place] American female  [phone] with Patient's last menstrual period was 08/07/2015.  here for Pre Op for 08/21/15 ROBOTIC ASSISTED TOTAL HYSTERECTOMY WITH SALPINGECTOMY AND CYSTOSCOPY, POSSIBLE HYSTERECTOMY ABDOMINAL WITH BILATERAL SALPINGO OOPHORECTOMY AND  Has fibroids, dysmenorrhea and menorrhagia. On combined oral contraceptives and having bleeding outside of cycle time.  Patient declines future childbearing and is requesting hysterectomy.  Pelvic ultrasound on 04/27/15: Uterus - multiple fibroids - 0.5 - 3.7 cm. EMS - 17.38 mm Ovaries - normal and left ovary with 11 mm CL cyst.  Free fluid - yes - mild to mod echo free fluid.  Saline ultrasound: 2 possiblefilling defects noted - 18 mm and 24 mm - endometrial polyps versus generalized thickened endometrium.  EMB: BENIGN SECRETORY PHASE ENDOMETRIUM.  -NO EVIDENCE OF ENDOMETRIAL HYPERPLASIA OR MALIGNANCY   Surgery was scheduled for 07/17/15 and patient cancelled due to need for evaluation with medical oncology for elevated serum globulin.  Oncology has stated that she does not have multiple myeloma. Hgb 11.8 on 07/31/15. On iron.   Has possible Crohn's disease. Taking Azacol.   Has preop visit a the hospital tomorrow.   GYNECOLOGIC HISTORY: Patient's last menstrual period was 08/07/2015. Contraception: OCP Menopausal hormone therapy: None Last mammogram: 03/30/15 [place] Left BIRADs2:Benign  Last pap smear: 03/31/15 Neg. HR HPV:neg   OB History    Gravida Para Term Preterm AB TAB SAB Ectopic Multiple Living   5 3 3       3        Patient Active Problem List   Diagnosis Date Noted  . Low calcium levels 07/14/2015  . 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 Dyspnea 07/11/2014  . Upper  airway cough syndrome 07/11/2014  . Iron deficiency anemia 07/11/2014  . HLA B27 (HLA B27 positive) 07/06/2014  . Low serum vitamin D   . Non-celiac gluten sensitivity   . Spondyloarthritis     Past Medical History  Diagnosis Date  . Chicken pox   . UTI (lower urinary tract infection)   . Low serum vitamin D   . Non-celiac gluten sensitivity   . Spondyloarthritis   . Abnormal uterine bleeding   . Anemia   . Genital warts   . History of chlamydia   . HSV-1 (herpes simplex virus 1) infection   . Colitis   . Reactive airway disease   . Low calcium levels     Past Surgical History  Procedure Laterality Date  . None    . Mouth surgery      cyst in mouth  . Wisdom tooth extraction    . Colonoscopy      Current Outpatient Prescriptions  Medication Sig Dispense Refill  . acetaminophen (TYLENOL) 325 MG tablet Take 650 mg by mouth every 6 (six) hours as needed for mild pain.     . Cholecalciferol (VITAMIN D3) 10000 UNITS TABS Take 1 tablet by mouth 2 (two) times daily.    . Digestive Enzyme CAPS Take 1 capsule by mouth 3 (three) times daily with meals.    . Ferrous Fumarate 29 MG TABS Take 3 tablets by mouth 2 (two) times daily.    Marland Kitchen levonorgestrel-ethinyl estradiol (SEASONALE,INTROVALE,JOLESSA) 0.15-0.03 MG tablet Take 1 tablet by mouth daily. 1 Package 3  . Mesalamine (ASACOL HD) 800 MG TBEC Take 1,600 mg by  mouth 2 (two) times daily.    . mometasone-formoterol (DULERA) 100-5 MCG/ACT AERO Inhale 2 puffs into the lungs 2 (two) times daily. 1 Inhaler 0  . Multiple Vitamins-Minerals (HM MULTIVITAMIN ADULT GUMMY PO) Take 3 each by mouth daily.    Marland Kitchen PROAIR HFA 108 (90 BASE) MCG/ACT inhaler Inhale 2 puffs into the lungs every 4 (four) hours as needed.     No current facility-administered medications for this visit.     ALLERGIES: Codeine and  Peanut-containing drug products  Family History  Problem Relation Age of Onset  . Hypertension Mother   . Diabetes Mother     ? father  . Cancer Paternal Grandmother     ? type  . Diabetes Father     Social History   Social History  . Marital Status: Married    Spouse Name: N/A  . Number of Children: N/A  . Years of Education: N/A   Occupational History  . Not on file.   Social History Main Topics  . Smoking status: Never Smoker   . Smokeless tobacco: Never Used  . Alcohol Use: 1.2 oz/week    2 Standard drinks or equivalent per week  . Drug Use: No  . Sexual Activity:    Partners: Male    Birth Control/ Protection: OCP     Comment: Camrese   Other Topics Concern  . 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 [name]. [name].    Got 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  PHYSICAL EXAMINATION:   BP 98/70 mmHg  Pulse 78  Resp 16  Ht 5' 3"  (1.6 m)  Wt 98 lb (44.453 kg)  BMI 17.36 kg/m2  LMP 08/07/2015  General appearance: alert, cooperative and appears stated age Head: Normocephalic, without obvious abnormality, atraumatic Neck: no adenopathy, supple, symmetrical, trachea midline and thyroid normal to inspection and palpation Lungs: clear to auscultation bilaterally  Heart: regular rate and rhythm Abdomen: soft, non-tender; bowel sounds normal; no masses, no organomegaly Extremities: extremities normal, atraumatic, no cyanosis or edema Skin: Skin color, texture, turgor normal. No rashes or lesions Lymph nodes: Cervical, supraclavicular, and axillary nodes normal. No abnormal inguinal nodes  palpated Neurologic: Grossly normal  Pelvic: External genitalia: no lesions  Urethra: normal appearing urethra with no masses, tenderness or lesions  Bartholins and Skenes: normal   Vagina: normal appearing vagina with normal color and discharge, no lesions  Cervix: no lesions and cervix is off to the right of the vagina.   Bimanual Exam: Uterus: enlarged,  12 weeks size and irregular, Fibroids are palpated posteriorly and uterine mobility is limited.  Adnexa: normal adnexa and no mass, fullness, tenderness  Rectovaginal: Yes. . Confirms.  Anus: normal sphincter tone, no lesions  Chaperone was present for exam.  ASSESSMENT  Multifibroid uterus.  Menorrhagia and dysmenorrhea.  On combined OCPs.  PLAN  Discussion of hysterectomy - robotic total laparoscopic hysterectomy with bilateral salpingectomy and cystoscopy and possible total abdominal hysterectomy with possible bilateral salpingo-oophorectomy. I have discussed risks, benefits, and alternative and the patient wishes to proceed. We did spend a significant amount of time discussing the possibility of both an abdominal hysterectomy if the procedure cannot  be completed laparoscopically and bilateral oophorectomy if the ovaries are abnormal or found significant endometriosis. We discussed the risks and benefits of oophorectomy including but not limited to cardiac effects, bone health, menopausal symptoms, and sexual functioning. Patient understands that hormonal therapy is possible.  Surgical expectations and recovery discussed.  Magnesium citrate bowel prep also planned. Preop visit tomorrow at hospital.  An After Visit Summary was printed and given to the patient.  __25____ minutes face to face time of which over 50% was spent in counseling.

## 2015-08-20 NOTE — Anesthesia Preprocedure Evaluation (Addendum)
Anesthesia Evaluation  Patient identified by MRN, date of birth, ID band Patient awake    Reviewed: Allergy & Precautions, NPO status , Patient's Chart, lab work & pertinent test results  Airway Mallampati: I       Dental no notable dental hx.    Pulmonary shortness of breath and with exertion, asthma ,    Pulmonary exam normal        Cardiovascular negative cardio ROS Normal cardiovascular exam+ dysrhythmias      Neuro/Psych negative neurological ROS  negative psych ROS   GI/Hepatic Neg liver ROS, PUD, Hx/o colitis   Endo/Other    Renal/GU negative Renal ROS  negative genitourinary   Musculoskeletal  (+) Arthritis , Spondyloarthritis- neck   Abdominal Normal abdominal exam  (+)   Peds  Hematology  (+) anemia , HLA B-27   Anesthesia Other Findings   Reproductive/Obstetrics Fibroid uterus AUB Dysmenorrhea                          Anesthesia Physical Anesthesia Plan  ASA: II  Anesthesia Plan: General   Post-op Pain Management:    Induction: Intravenous  Airway Management Planned: Oral ETT  Additional Equipment:   Intra-op Plan:   Post-operative Plan: Extubation in OR  Informed Consent: I have reviewed the patients History and Physical, chart, labs and discussed the procedure including the risks, benefits and alternatives for the proposed anesthesia with the patient or authorized representative who has indicated his/her understanding and acceptance.   Dental advisory given  Plan Discussed with: CRNA, Anesthesiologist and Surgeon  Anesthesia Plan Comments: (May need glidescope for intubation)        Anesthesia Quick Evaluation

## 2015-08-21 ENCOUNTER — Encounter (HOSPITAL_COMMUNITY)
Admission: RE | Disposition: A | Discharge status: Home / Self Care | Payer: Self-pay | Source: Ambulatory Visit | Attending: Obstetrics and Gynecology

## 2015-08-21 ENCOUNTER — Observation Stay (HOSPITAL_COMMUNITY)
Admission: RE | Admit: 2015-08-21 | Discharge: 2015-08-23 | Disposition: A | Discharge status: Home / Self Care | Payer: Federal, State, Local not specified - PPO | Source: Ambulatory Visit | Attending: Obstetrics and Gynecology | Admitting: Obstetrics and Gynecology

## 2015-08-21 ENCOUNTER — Ambulatory Visit (HOSPITAL_COMMUNITY): Payer: Federal, State, Local not specified - PPO | Admitting: Anesthesiology

## 2015-08-21 DIAGNOSIS — D259 Leiomyoma of uterus, unspecified: Secondary | ICD-10-CM | POA: Diagnosis not present

## 2015-08-21 DIAGNOSIS — N920 Excessive and frequent menstruation with regular cycle: Principal | ICD-10-CM | POA: Insufficient documentation

## 2015-08-21 DIAGNOSIS — K567 Ileus, unspecified: Secondary | ICD-10-CM | POA: Insufficient documentation

## 2015-08-21 DIAGNOSIS — E876 Hypokalemia: Secondary | ICD-10-CM | POA: Insufficient documentation

## 2015-08-21 DIAGNOSIS — Z90710 Acquired absence of both cervix and uterus: Secondary | ICD-10-CM

## 2015-08-21 DIAGNOSIS — N946 Dysmenorrhea, unspecified: Secondary | ICD-10-CM | POA: Insufficient documentation

## 2015-08-21 DIAGNOSIS — N926 Irregular menstruation, unspecified: Secondary | ICD-10-CM | POA: Insufficient documentation

## 2015-08-21 HISTORY — PX: BILATERAL SALPINGECTOMY: SHX5743

## 2015-08-21 HISTORY — PX: ROBOTIC ASSISTED TOTAL HYSTERECTOMY WITH SALPINGECTOMY: SHX6679

## 2015-08-21 HISTORY — PX: ABDOMINAL HYSTERECTOMY: SHX81

## 2015-08-21 HISTORY — DX: Acquired absence of both cervix and uterus: Z90.710

## 2015-08-21 HISTORY — PX: CYSTOSCOPY: SHX5120

## 2015-08-21 LAB — BASIC METABOLIC PANEL
Anion gap: 9 (ref 5–15)
BUN: 6 mg/dL (ref 6–20)
CALCIUM: 8 mg/dL — AB (ref 8.9–10.3)
CO2: 23 mmol/L (ref 22–32)
CREATININE: 0.69 mg/dL (ref 0.44–1.00)
Chloride: 102 mmol/L (ref 101–111)
GFR calc Af Amer: >60 mL/min (ref >60)
GLUCOSE: 110 mg/dL — AB (ref 65–99)
Potassium: 3.9 mmol/L (ref 3.5–5.1)
Sodium: 134 mmol/L — ABNORMAL LOW (ref 135–145)

## 2015-08-21 LAB — PREGNANCY, URINE: Preg Test, Ur: NEGATIVE

## 2015-08-21 SURGERY — ROBOTIC ASSISTED TOTAL HYSTERECTOMY WITH SALPINGECTOMY
Anesthesia: General | Site: Bladder

## 2015-08-21 MED ORDER — NEOSTIGMINE METHYLSULFATE 10 MG/10ML IV SOLN
INTRAVENOUS | Status: AC
  Filled 2015-08-21: qty 1

## 2015-08-21 MED ORDER — ROPIVACAINE HCL 5 MG/ML IJ SOLN
INTRAMUSCULAR | Status: AC
  Filled 2015-08-21: qty 30

## 2015-08-21 MED ORDER — ADULT MULTIVITAMIN W/MINERALS CH
1.0000 | ORAL_TABLET | Freq: Every day | ORAL | Status: DC
  Administered 2015-08-22: 1 via ORAL
  Filled 2015-08-21 (×3): qty 1

## 2015-08-21 MED ORDER — MESALAMINE 400 MG PO CPDR
1600.0000 mg | DELAYED_RELEASE_CAPSULE | Freq: Two times a day (BID) | ORAL | Status: DC
  Administered 2015-08-21: 1600 mg via ORAL
  Administered 2015-08-22: 800 mg via ORAL
  Administered 2015-08-22: 1600 mg via ORAL
  Filled 2015-08-21 (×6): qty 4

## 2015-08-21 MED ORDER — DEXAMETHASONE SODIUM PHOSPHATE 10 MG/ML IJ SOLN
INTRAMUSCULAR | Status: DC | PRN
  Administered 2015-08-21: 4 mg via INTRAVENOUS

## 2015-08-21 MED ORDER — SODIUM CHLORIDE 0.9 % IV SOLN
INTRAVENOUS | Status: DC | PRN
  Administered 2015-08-21: 60 mL

## 2015-08-21 MED ORDER — LACTATED RINGERS IV SOLN
INTRAVENOUS | Status: DC
  Administered 2015-08-21 (×2): via INTRAVENOUS

## 2015-08-21 MED ORDER — LIDOCAINE HCL (CARDIAC) 20 MG/ML IV SOLN
INTRAVENOUS | Status: AC
  Filled 2015-08-21: qty 5

## 2015-08-21 MED ORDER — ROCURONIUM BROMIDE 100 MG/10ML IV SOLN
INTRAVENOUS | Status: AC
  Filled 2015-08-21: qty 1

## 2015-08-21 MED ORDER — BUPIVACAINE HCL (PF) 0.25 % IJ SOLN
INTRAMUSCULAR | Status: AC
  Filled 2015-08-21: qty 30

## 2015-08-21 MED ORDER — NEOSTIGMINE METHYLSULFATE 10 MG/10ML IV SOLN
INTRAVENOUS | Status: DC | PRN
  Administered 2015-08-21: 3 mg via INTRAVENOUS

## 2015-08-21 MED ORDER — STERILE WATER FOR IRRIGATION IR SOLN
Status: DC | PRN
  Administered 2015-08-21: 1000 mL via INTRAVESICAL

## 2015-08-21 MED ORDER — ROCURONIUM BROMIDE 100 MG/10ML IV SOLN
INTRAVENOUS | Status: DC | PRN
  Administered 2015-08-21: 5 mg via INTRAVENOUS
  Administered 2015-08-21 (×2): 10 mg via INTRAVENOUS
  Administered 2015-08-21: 25 mg via INTRAVENOUS

## 2015-08-21 MED ORDER — LACTATED RINGERS IV SOLN
INTRAVENOUS | Status: DC
  Administered 2015-08-21 – 2015-08-23 (×3): via INTRAVENOUS

## 2015-08-21 MED ORDER — FENTANYL CITRATE (PF) 100 MCG/2ML IJ SOLN: 25.0000 ug | INTRAMUSCULAR | Status: DC | PRN

## 2015-08-21 MED ORDER — GLYCOPYRROLATE 0.2 MG/ML IJ SOLN
INTRAMUSCULAR | Status: AC
  Filled 2015-08-21: qty 3

## 2015-08-21 MED ORDER — IBUPROFEN 600 MG PO TABS
600.0000 mg | ORAL_TABLET | Freq: Four times a day (QID) | ORAL | Status: DC | PRN
  Administered 2015-08-23: 600 mg via ORAL
  Filled 2015-08-21: qty 1

## 2015-08-21 MED ORDER — PHENYLEPHRINE HCL 10 MG/ML IJ SOLN
INTRAMUSCULAR | Status: DC | PRN
  Administered 2015-08-21: 80 ug via INTRAVENOUS

## 2015-08-21 MED ORDER — BUPIVACAINE HCL (PF) 0.25 % IJ SOLN
INTRAMUSCULAR | Status: DC | PRN
  Administered 2015-08-21: 6 mL

## 2015-08-21 MED ORDER — SODIUM CHLORIDE 0.9 % IJ SOLN
INTRAMUSCULAR | Status: AC
  Filled 2015-08-21: qty 50

## 2015-08-21 MED ORDER — ONDANSETRON HCL 4 MG/2ML IJ SOLN
INTRAMUSCULAR | Status: AC
  Filled 2015-08-21: qty 2

## 2015-08-21 MED ORDER — MORPHINE SULFATE (PF) 4 MG/ML IV SOLN
2.0000 mg | INTRAVENOUS | Status: DC | PRN
  Administered 2015-08-21 – 2015-08-23 (×2): 2 mg via INTRAVENOUS
  Filled 2015-08-21 (×2): qty 1

## 2015-08-21 MED ORDER — GLYCOPYRROLATE 0.2 MG/ML IJ SOLN
INTRAMUSCULAR | Status: DC | PRN
  Administered 2015-08-21: 0.4 mg via INTRAVENOUS

## 2015-08-21 MED ORDER — ALBUTEROL SULFATE (2.5 MG/3ML) 0.083% IN NEBU: 3.0000 mL | INHALATION_SOLUTION | RESPIRATORY_TRACT | Status: DC | PRN

## 2015-08-21 MED ORDER — VITAMIN D3 250 MCG (10000 UT) PO TABS: 1.0000 | ORAL_TABLET | Freq: Two times a day (BID) | ORAL | Status: DC

## 2015-08-21 MED ORDER — ONDANSETRON HCL 4 MG/2ML IJ SOLN
4.0000 mg | Freq: Four times a day (QID) | INTRAMUSCULAR | Status: DC | PRN
  Administered 2015-08-22 (×2): 4 mg via INTRAVENOUS
  Filled 2015-08-21 (×2): qty 2

## 2015-08-21 MED ORDER — ONDANSETRON HCL 4 MG/2ML IJ SOLN
INTRAMUSCULAR | Status: DC | PRN
  Administered 2015-08-21: 4 mg via INTRAVENOUS

## 2015-08-21 MED ORDER — LACTATED RINGERS IR SOLN
Status: DC | PRN
  Administered 2015-08-21: 3000 mL

## 2015-08-21 MED ORDER — MIDAZOLAM HCL 2 MG/2ML IJ SOLN
INTRAMUSCULAR | Status: DC | PRN
  Administered 2015-08-21: 2 mg via INTRAVENOUS

## 2015-08-21 MED ORDER — KETOROLAC TROMETHAMINE 30 MG/ML IJ SOLN: 30.0000 mg | Freq: Once | INTRAMUSCULAR | Status: DC | PRN

## 2015-08-21 MED ORDER — ARTIFICIAL TEARS OP OINT
TOPICAL_OINTMENT | OPHTHALMIC | Status: AC
  Filled 2015-08-21: qty 3.5

## 2015-08-21 MED ORDER — MENTHOL 3 MG MT LOZG: 1.0000 | LOZENGE | OROMUCOSAL | Status: DC | PRN

## 2015-08-21 MED ORDER — MEPERIDINE HCL 25 MG/ML IJ SOLN: 6.2500 mg | INTRAMUSCULAR | Status: DC | PRN

## 2015-08-21 MED ORDER — LACTATED RINGERS IV SOLN
INTRAVENOUS | Status: DC
  Administered 2015-08-21 (×2): via INTRAVENOUS

## 2015-08-21 MED ORDER — PHENYLEPHRINE 40 MCG/ML (10ML) SYRINGE FOR IV PUSH (FOR BLOOD PRESSURE SUPPORT)
PREFILLED_SYRINGE | INTRAVENOUS | Status: AC
  Filled 2015-08-21: qty 10

## 2015-08-21 MED ORDER — MIDAZOLAM HCL 2 MG/2ML IJ SOLN
INTRAMUSCULAR | Status: AC
  Filled 2015-08-21: qty 2

## 2015-08-21 MED ORDER — PROPOFOL 10 MG/ML IV BOLUS
INTRAVENOUS | Status: AC
  Filled 2015-08-21: qty 20

## 2015-08-21 MED ORDER — LIDOCAINE HCL (CARDIAC) 20 MG/ML IV SOLN
INTRAVENOUS | Status: DC | PRN
  Administered 2015-08-21: 80 mg via INTRAVENOUS

## 2015-08-21 MED ORDER — SCOPOLAMINE 1 MG/3DAYS TD PT72
MEDICATED_PATCH | TRANSDERMAL | Status: AC
  Administered 2015-08-21: 1.5 mg
  Filled 2015-08-21: qty 1

## 2015-08-21 MED ORDER — OXYCODONE-ACETAMINOPHEN 5-325 MG PO TABS: 1.0000 | ORAL_TABLET | ORAL | Status: DC | PRN

## 2015-08-21 MED ORDER — FENTANYL CITRATE (PF) 250 MCG/5ML IJ SOLN
INTRAMUSCULAR | Status: AC
  Filled 2015-08-21: qty 5

## 2015-08-21 MED ORDER — PROPOFOL 10 MG/ML IV BOLUS
INTRAVENOUS | Status: DC | PRN
  Administered 2015-08-21: 150 mg via INTRAVENOUS

## 2015-08-21 MED ORDER — DEXAMETHASONE SODIUM PHOSPHATE 4 MG/ML IJ SOLN
INTRAMUSCULAR | Status: AC
  Filled 2015-08-21: qty 1

## 2015-08-21 MED ORDER — FENTANYL CITRATE (PF) 100 MCG/2ML IJ SOLN
INTRAMUSCULAR | Status: DC | PRN
  Administered 2015-08-21: 100 ug via INTRAVENOUS
  Administered 2015-08-21 (×2): 50 ug via INTRAVENOUS

## 2015-08-21 MED ORDER — PROMETHAZINE HCL 25 MG/ML IJ SOLN
INTRAMUSCULAR | Status: AC
Start: 2015-08-21 — End: 2015-08-22
  Filled 2015-08-21: qty 1

## 2015-08-21 MED ORDER — MOMETASONE FURO-FORMOTEROL FUM 100-5 MCG/ACT IN AERO
2.0000 | INHALATION_SPRAY | Freq: Two times a day (BID) | RESPIRATORY_TRACT | Status: DC
  Administered 2015-08-21 – 2015-08-23 (×4): 2 via RESPIRATORY_TRACT
  Filled 2015-08-21: qty 8.8

## 2015-08-21 MED ORDER — PROMETHAZINE HCL 25 MG/ML IJ SOLN
6.2500 mg | INTRAMUSCULAR | Status: DC | PRN
  Administered 2015-08-21: 6.25 mg via INTRAVENOUS

## 2015-08-21 MED ORDER — ONDANSETRON HCL 4 MG PO TABS
4.0000 mg | ORAL_TABLET | Freq: Four times a day (QID) | ORAL | Status: DC | PRN
  Administered 2015-08-23: 4 mg via ORAL
  Filled 2015-08-21: qty 1

## 2015-08-21 SURGICAL SUPPLY — 78 items
BARRIER ADHS 3X4 INTERCEED (GAUZE/BANDAGES/DRESSINGS) IMPLANT
CANISTER SUCT 3000ML (MISCELLANEOUS) ×4 IMPLANT
CATH FOLEY 3WAY  5CC 16FR (CATHETERS) ×1
CATH FOLEY 3WAY 5CC 16FR (CATHETERS) ×3 IMPLANT
CELLS DAT CNTRL 66122 CELL SVR (MISCELLANEOUS) IMPLANT
CLOTH BEACON ORANGE TIMEOUT ST (SAFETY) ×4 IMPLANT
CONT PATH 16OZ SNAP LID 3702 (MISCELLANEOUS) ×4 IMPLANT
COVER BACK TABLE 60X90IN (DRAPES) ×8 IMPLANT
COVER TIP SHEARS 8 DVNC (MISCELLANEOUS) ×3 IMPLANT
COVER TIP SHEARS 8MM DA VINCI (MISCELLANEOUS) ×1
DECANTER SPIKE VIAL GLASS SM (MISCELLANEOUS) ×12 IMPLANT
DRAPE ROBOTICS STRL (DRAPES) IMPLANT
DRAPE WARM FLUID 44X44 (DRAPE) ×4 IMPLANT
DRSG OPSITE POSTOP 4X10 (GAUZE/BANDAGES/DRESSINGS) ×4 IMPLANT
DURAPREP 26ML APPLICATOR (WOUND CARE) ×4 IMPLANT
ELECT REM PT RETURN 9FT ADLT (ELECTROSURGICAL) ×4
ELECTRODE REM PT RTRN 9FT ADLT (ELECTROSURGICAL) ×3 IMPLANT
GAUZE SPONGE 4X4 16PLY XRAY LF (GAUZE/BANDAGES/DRESSINGS) IMPLANT
GAUZE VASELINE 3X9 (GAUZE/BANDAGES/DRESSINGS) IMPLANT
GLOVE BIO SURGEON STRL SZ 6.5 (GLOVE) ×12 IMPLANT
GLOVE BIOGEL PI IND STRL 6.5 (GLOVE) ×3 IMPLANT
GLOVE BIOGEL PI IND STRL 7.0 (GLOVE) ×9 IMPLANT
GLOVE BIOGEL PI INDICATOR 6.5 (GLOVE) ×1
GLOVE BIOGEL PI INDICATOR 7.0 (GLOVE) ×3
GOWN STRL REUS W/TWL LRG LVL3 (GOWN DISPOSABLE) IMPLANT
KIT ACCESSORY DA VINCI DISP (KITS) ×1
KIT ACCESSORY DVNC DISP (KITS) ×3 IMPLANT
LEGGING LITHOTOMY PAIR STRL (DRAPES) ×4 IMPLANT
LIQUID BAND (GAUZE/BANDAGES/DRESSINGS) ×4 IMPLANT
NEEDLE HYPO 22GX1.5 SAFETY (NEEDLE) IMPLANT
NS IRRIG 1000ML POUR BTL (IV SOLUTION) ×4 IMPLANT
OCCLUDER COLPOPNEUMO (BALLOONS) ×4 IMPLANT
PACK ABDOMINAL GYN (CUSTOM PROCEDURE TRAY) IMPLANT
PACK ROBOT WH (CUSTOM PROCEDURE TRAY) ×4 IMPLANT
PACK ROBOTIC GOWN (GOWN DISPOSABLE) ×4 IMPLANT
PACK VAGINAL MINOR WOMEN LF (CUSTOM PROCEDURE TRAY) IMPLANT
PAD OB MATERNITY 4.3X12.25 (PERSONAL CARE ITEMS) ×4 IMPLANT
PAD POSITIONING PINK XL (MISCELLANEOUS) ×4 IMPLANT
PAD PREP 24X48 CUFFED NSTRL (MISCELLANEOUS) ×8 IMPLANT
RETRACTOR WND ALEXIS 25 LRG (MISCELLANEOUS) IMPLANT
RTRCTR WOUND ALEXIS 18CM MED (MISCELLANEOUS)
RTRCTR WOUND ALEXIS 25CM LRG (MISCELLANEOUS)
SET BI-LUMEN FLTR TB AIRSEAL (TUBING) ×4 IMPLANT
SET CYSTO W/LG BORE CLAMP LF (SET/KITS/TRAYS/PACK) ×4 IMPLANT
SET IRRIG TUBING LAPAROSCOPIC (IRRIGATION / IRRIGATOR) ×4 IMPLANT
SHEET LAVH (DRAPES) IMPLANT
SPONGE LAP 18X18 X RAY DECT (DISPOSABLE) IMPLANT
STAPLER VISISTAT 35W (STAPLE) IMPLANT
SUT PLAIN 2 0 XLH (SUTURE) IMPLANT
SUT VIC AB 0 CT1 18XCR BRD8 (SUTURE) IMPLANT
SUT VIC AB 0 CT1 27 (SUTURE)
SUT VIC AB 0 CT1 27XBRD ANBCTR (SUTURE) IMPLANT
SUT VIC AB 0 CT1 8-18 (SUTURE)
SUT VIC AB 0 CT2 27 (SUTURE) ×8 IMPLANT
SUT VIC AB 2-0 CT1 27 (SUTURE)
SUT VIC AB 2-0 CT1 TAPERPNT 27 (SUTURE) IMPLANT
SUT VIC AB 4-0 PS2 27 (SUTURE) ×8 IMPLANT
SUT VICRYL 0 TIES 12 18 (SUTURE) IMPLANT
SUT VICRYL 0 UR6 27IN ABS (SUTURE) ×8 IMPLANT
SUT VLOC 180 0 9IN  GS21 (SUTURE) ×1
SUT VLOC 180 0 9IN GS21 (SUTURE) ×3 IMPLANT
SYR 50ML LL SCALE MARK (SYRINGE) ×4 IMPLANT
SYR CONTROL 10ML LL (SYRINGE) IMPLANT
SYSTEM CONVERTIBLE TROCAR (TROCAR) ×4 IMPLANT
TIP RUMI ORANGE 6.7MMX12CM (TIP) IMPLANT
TIP UTERINE 5.1X6CM LAV DISP (MISCELLANEOUS) IMPLANT
TIP UTERINE 6.7X10CM GRN DISP (MISCELLANEOUS) ×4 IMPLANT
TIP UTERINE 6.7X6CM WHT DISP (MISCELLANEOUS) IMPLANT
TIP UTERINE 6.7X8CM BLUE DISP (MISCELLANEOUS) IMPLANT
TOWEL OR 17X24 6PK STRL BLUE (TOWEL DISPOSABLE) ×12 IMPLANT
TRAY FOLEY BAG SILVER LF 16FR (SET/KITS/TRAYS/PACK) IMPLANT
TRAY FOLEY CATH SILVER 14FR (SET/KITS/TRAYS/PACK) IMPLANT
TROCAR DILATING TIP 12MM 150MM (ENDOMECHANICALS) ×4 IMPLANT
TROCAR DISP BLADELESS 8 DVNC (TROCAR) ×3 IMPLANT
TROCAR DISP BLADELESS 8MM (TROCAR) ×1
TUBING INSUFFLATION W/FILTER (TUBING) IMPLANT
WARMER LAPAROSCOPE (MISCELLANEOUS) IMPLANT
WATER STERILE IRR 1000ML POUR (IV SOLUTION) ×12 IMPLANT

## 2015-08-21 NOTE — Op Note (Signed)
OPERATIVE REPORT   PREOPERATIVE DIAGNOSIS:   Menorrhagia with irregular bleeding, fibroids, dysmenorrhea.  POSTOPERATIVE DIAGNOSIS:   Menorrhagia with irregular bleeding, fibroids, dysmenorrhea.  PROCEDURES: Da Vinci robotic total laparoscopic hysterectomy with bilateral salpingectomy, cystoscopy  SURGEON: Lenard Galloway, M.D.  ASSISTANT:  Salvadore Dom, M.D.  ANESTHESIA: General endotracheal, intraperitoneal ropivicaine 30 mL, diluted in 30 mL of normal saline, local with 0.25% Marcaine.  IV FLUIDS: 1800 cc LR.  ESTIMATED BLOOD LOSS:   200 cc.  URINE OUTPUT: 200 cc.  COMPLICATIONS: None.  INDICATIONS FOR THE PROCEDURE:    The patient is a 44 year old P2 African American female with uterine fibroids and menorrhagia with irregular menses and dysmenorrhea.  The patient has failed a course of combined oral contraceptives.  She desires definitive surgery for her bleeding and declines future childbearing.   A plan is made to proceed with a robotic total laparoscopic hysterectomy with bilateral salpingectomy and cystoscopy after risks, benefits, and alternatives are reviewed.  FINDINGS:    The uterus contained a posterior fundal subserosal fibroid measuring 5 cm.  The fallopian tubes and ovaries were unremarkable.  There was a minor adhesion of the right ovary to the right broad ligament, and this was lysed with monopolar cautery.  There was a small 4 mm lesion of possible endometriosis in the right posterior cul de sac.  The appendix, liver, and gall bladder were unremarkable.  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   Cystoscopy at the termination of the procedure showed the bladder to be normal throughout 360 degrees including the bladder dome and trigone. There was no evidence of any foreign body in the bladder or the urethra. There was no evidence of any lesions  of the bladder or the urethra.  Both of the ureters were  noted to be patent bilaterally.  SPECIMENS:    The uterus, cervix, and bilateral tubes were went to pathology.   DESCRIPTION OF PROCEDURE:   The patient was reidentified in the preoperative hold area.  She did receive Cefotetan IV for antibiotic prophylaxis. She received TED hose and PAS stockings for DVT prophylaxis.  In the operating room, the patient was placed in the dorsal lithotomy position on the operating room table. The anti-slip pad was used under the patient. Her legs were placed in the [place] stirrups and her arms were both tucked at her sides with the appropriate padded arm protectors. The patient received general endotracheal anesthesia and she then received proper padding around her head and neck.  The abdomen and vagina were then sterilely prepped and she was sterilely draped.  A speculum was placed in the vagina and a single-tooth tenaculum was placed on the anterior cervical lip. A figure-of-eight suture of 0 Vicryl was placed on each the anterior and the posterior cervical lips. The uterus was sounded to 11 cm. The cervix was then dilated with [facility] dilators. A #  10 RUMI tip with a KOH ring was then placed through the cervix and into the uterine cavity without difficulty. The remaining vaginal instruments were then removed. A Foley catheter was placed inside the bladder.  Attention was turned to the abdomen where the umbilical region was injected with 0.25% Marcaine and a small incision created after the skin was elevated with penetrating towel clips.  A Veress needle was then used to insufflate the abdomen with CO2 gas after a saline drop test was performed and the fluid flowed freely.  A 12  mm supraumbilical incision was created with a scalpel after the skin was injected locally with 0.25% Marcaine. A 12 mm robotic camera port was then placed through this umbilical incision. 8 mm incisions were then created to the right and left of the supraumbilical incision after  the skin was injected locally with 0.25% Marcaine.  The 8 mm trocars were then placed under visualization of the laparoscope.  A 5 mm right lower quadrant/right lateral assistant port was created after the skin was injected locally with 0.25% Marcaine.   The trocar was placed under visualization of the laparoscope.  Ropivicine was placed inside the peritoneal cavity.   At this time, the patient was placed in Trendelenburg position. An inspection of the abdomen and pelvis was performed. The findings are as noted above.   The robot was docked on the patient's left-hand side. The monopolar laparoscopic scissors was placed in robotic port #1 and the PK instrument was placed through robotic port site #2.  At this time, I stepped to the surgeon's console while my assistant and the rest of the surgical team remained at the patient's side.  At this time, the left ureter was identified.  The left fallopian tube was identified, and the distal end of the mesosalpinx was cauterized with a PK instrument and then sharply divided with the laparoscopic scissors. Dissection was then continued through the remainder of the mesosalpinx using again the PK instrument for cautery and the monopolar scissors for cutting. The left round ligament was then cauterized with the PK instrument and sharply divided with the laparoscopic scissors.The same technique was used for cautery and cutting of the left utero-ovarian ligament.  Dissection was performed to the anterior and posterior leaves of the broad ligaments again using the monopolar laparoscopic scissors.The incision was carried across the anterior cul-de-sac along the vesicouterine fold and the bladder was dissected away from the cervix using monopolar cautery and the laparoscopic scissors. The peritoneum was similarly taken down posteriorly. The left uterine artery was skeletonized at this time using sharp dissection and monopolar cautery.   It was then cauterized with  the PK instrument and was then cut using the laparoscopic scissors.    Attention was turned to the patient's right-hand side at this time. Along the right pelvic sidewall, the ureter was identified.The right fallopian tube was identified, and the distal end of the mesosalpinx was cauterized with a PK instrument and then sharply divided with the laparoscopic scissors. Dissection was then continued through the remainder of the mesosalpinx using again the PK instrument for cautery and the monopolar scissors for cutting.  Dissection continued through the right broad ligament using the monopolar scissors for cautery and cutting. The right round ligament was then cauterized with the PK instrument and sharply divided with the laparoscopic monopolar scissors. Again, the anterior and posterior leaves of the broad ligament were taken down using monopolar laparoscopic scissors for dissection. The adhesion of the right ovary to the right broad ligament was lysed with monopolar cautery.  The bladder flap was taken down anteriorly using the monopolar scissors. The uterine artery was skeletonized along the patient's right hand side and was then cauterized with the PK instrument and was divided with the laparoscopic scissors.    The KOH ring was nicely visible. The colpotomy incision was performed with the laparoscopic monopolar scissors in a circumferential fashion. There was bleeding from the uterine artery on the patient's right side during the colpotomy, and this was controlled with the PK instrument. It appeared to  be back bleeding.  As the colpotomy was completed on the patient's left hand side, a descending branch of the uterine artery was cauterized with PK and then sharply divided. The specimen was then removed from the peritoneal cavity and was sent to Pathology. The balloon occluder was placed in the vagina. The vaginal cuff was sutured at this time using a running suture of 0 V-Loc. The vagina was  closed from the patient's right hand side to the left hand side and then back 2 sutures towards the midline. This provided good full-thickness closure of the vaginal cuff.  The pelvis was irrigated and suctioned.  There was good hemostasis of the operative sites and pedicles.  The laparoscopic needle for suturing was parked in the anterior peritoneum.  Final inspection of all operative sites demonstrated good hemostasis. The robotic laparoscopic instruments were removed.  The robot was undocked.  A 8-mm laparoscope was placed at this time and the needle was retrieved from inside the peritoneal cavity. The laparoscopic trocars were removed under visualization of the laparoscope. The CO2 pneumoperitoneum was released. The patient received manual breaths to remove any remaining CO2 gas and then the umbilical trocar was removed.  The patient's 3-way Foley catheter was removed at this time and cystoscopy was performed and the findings are as noted above. The Foley catheter was then placed inside the bladder and left to gravity drainage.  Final inspection of the vagina demonstrated good hemostasis of the vaginal cuff.  The abdominal incisions were closed. The umbilical fascia was closed with a figure-of-eight suture of 0 Vicryl. All skin incisions were closed with subcuticular sutures of 4-0 Vicryl. Dermabond was placed over the incisions and the umbilical incision was closed with a honeycomb dressing.  This concluded the patient's procedure. She was extubated and escorted to the recovery room in stable and awake condition. There were no complications to the procedure. All needle, instrument, and sponge counts were correct.     Lenard Galloway, M.D.

## 2015-08-21 NOTE — Progress Notes (Signed)
Day of Surgery Procedure(s) (LRB): ROBOTIC ASSISTED TOTAL HYSTERECTOMY  (Bilateral) CYSTOSCOPY (N/A) BILATERAL SALPINGECTOMY (Bilateral)  Subjective: Patient reports good pain control. Husband present at bedside.   Objective: I have reviewed patient's vital signs and intake and output. T 98.3, BP 127/78, P 68, RR 16. I/O - 2400 cc/500 cc.  General: sedated and appropriate in conversation. Resp: clear to auscultation bilaterally Cardio: regular rate and rhythm, S1, S2 normal, no murmur, click, rub or gallop GI: soft, non-tender; bowel sounds normal; no masses,  no organomegaly and incision: clean, dry and intact Vaginal Bleeding: none  Ext: PAS and Ted hose on.  Assessment: s/p Procedure(s): ROBOTIC ASSISTED TOTAL HYSTERECTOMY  (Bilateral) CYSTOSCOPY (N/A) BILATERAL SALPINGECTOMY (Bilateral): stable Hypokalemia pre-op.  Plan: Morphine for post op pain.  Percocet and Motrin when taking po well.  Clear liquids.  Continue foley.  Will check BMP due to low K level pre-op. Surgical findings and procedure reviewed briefly.     LOS: 0 days    Arloa Koh 08/21/2015, 6:05 PM

## 2015-08-21 NOTE — Brief Op Note (Signed)
08/21/2015  3:36 PM  PATIENT:  Janice Zuniga  44 y.o. female  PRE-OPERATIVE DIAGNOSIS:  ABNORMAL UTERINE BLEEDING dysmenorrhea, fibroids  POST-OPERATIVE DIAGNOSIS:  ABNORMAL UTERINE BLEEDING dysmenorrhea, fibroids  PROCEDURE:  Procedure(s): ROBOTIC ASSISTED TOTAL HYSTERECTOMY  (Bilateral) CYSTOSCOPY (N/A) BILATERAL SALPINGECTOMY (Bilateral)  SURGEON:  Surgeon(s) and Role:    * Sherin Murdoch Oletta Lamas, MD - Primary    * Salvadore Dom, MD - Assisting  PHYSICIAN ASSISTANT: NA  ASSISTANTS: Salvadore Dom, MD   ANESTHESIA:   local, general and Intraperitoneal ropivicaine.  EBL:  Total I/O In: 1800 [I.V.:1800] Out: 400 [Urine:200; Blood:200]  BLOOD ADMINISTERED:none  DRAINS: Urinary Catheter (Foley)   LOCAL MEDICATIONS USED:  MARCAINE     SPECIMEN:  Source of Specimen:  uterus, cervix, bilateral fallopian tubes.  DISPOSITION OF SPECIMEN:  PATHOLOGY  COUNTS:  YES  TOURNIQUET:  * No tourniquets in log *  DICTATION: .Note written in [place]: Admit for overnight observation  PATIENT DISPOSITION:  PACU - hemodynamically stable.   Delay start of Pharmacological VTE agent (>24hrs) due to surgical blood loss or risk of bleeding: not applicable

## 2015-08-21 NOTE — Progress Notes (Signed)
Update to History and Physical  Feels that her respiratory illness has resolved.   Patient examined.   OK to proceed with surgery.

## 2015-08-21 NOTE — Anesthesia Postprocedure Evaluation (Signed)
Anesthesia Post Note  Patient: Janice Zuniga  Procedure(s) Performed: Procedure(s) (LRB): ROBOTIC ASSISTED TOTAL HYSTERECTOMY  (Bilateral) CYSTOSCOPY (N/A) BILATERAL SALPINGECTOMY (Bilateral)  Patient location during evaluation: PACU Anesthesia Type: General Level of consciousness: sedated Pain management: pain level controlled Vital Signs Assessment: post-procedure vital signs reviewed and stable Respiratory status: spontaneous breathing Cardiovascular status: stable Postop Assessment: No signs of nausea or vomiting Anesthetic complications: no    Last Vitals:  Filed Vitals:   08/21/15 1545 08/21/15 1600  BP: 127/80 127/79  Pulse: 58 67  Temp:    Resp: 16 15    Last Pain: There were no vitals filed for this visit.               Alexander

## 2015-08-21 NOTE — Anesthesia Procedure Notes (Signed)
Procedure Name: Intubation Date/Time: 08/21/2015 12:37 PM Performed by: Casimer Lanius A Pre-anesthesia Checklist: Patient identified, Emergency Drugs available, Suction available and Patient being monitored Patient Re-evaluated:Patient Re-evaluated prior to inductionOxygen Delivery Method: Circle system utilized and Simple face mask Preoxygenation: Pre-oxygenation with 100% oxygen Intubation Type: Combination inhalational/ intravenous induction Ventilation: Mask ventilation without difficulty Laryngoscope Size: Mac and 3 Grade View: Grade II Tube type: Oral Tube size: 7.0 mm Number of attempts: 1 Airway Equipment and Method: Stylet Placement Confirmation: ETT inserted through vocal cords under direct vision,  positive ETCO2 and breath sounds checked- equal and bilateral Secured at: 20 (right lip) cm Tube secured with: Tape Dental Injury: Teeth and Oropharynx as per pre-operative assessment

## 2015-08-21 NOTE — Transfer of Care (Signed)
Immediate Anesthesia Transfer of Care Note  Patient: Janice Zuniga  Procedure(s) Performed: Procedure(s): ROBOTIC ASSISTED TOTAL HYSTERECTOMY  (Bilateral) CYSTOSCOPY (N/A) BILATERAL SALPINGECTOMY (Bilateral)  Patient Location: PACU  Anesthesia Type:General  Level of Consciousness: awake, alert  and oriented  Airway & Oxygen Therapy: Patient Spontanous Breathing and Patient connected to nasal cannula oxygen  Post-op Assessment: Report given to RN and Post -op Vital signs reviewed and stable  Post vital signs: Reviewed and stable  Last Vitals:  Filed Vitals:   08/21/15 1011  BP: 113/77  Pulse: 93  Temp: 36.8 C  Resp: 20    Complications: No apparent anesthesia complications

## 2015-08-22 ENCOUNTER — Encounter (HOSPITAL_COMMUNITY): Payer: Self-pay | Admitting: Obstetrics and Gynecology

## 2015-08-22 DIAGNOSIS — N920 Excessive and frequent menstruation with regular cycle: Secondary | ICD-10-CM | POA: Diagnosis not present

## 2015-08-22 LAB — CBC
HEMATOCRIT: 33 % — AB (ref 36.0–46.0)
Hemoglobin: 11 g/dL — ABNORMAL LOW (ref 12.0–15.0)
MCH: 24.8 pg — AB (ref 26.0–34.0)
MCHC: 33.3 g/dL (ref 30.0–36.0)
MCV: 74.5 fL — AB (ref 78.0–100.0)
PLATELETS: 417 10*3/uL — AB (ref 150–400)
RBC: 4.43 MIL/uL (ref 3.87–5.11)
RDW: 18.6 % — AB (ref 11.5–15.5)
WBC: 11.8 10*3/uL — ABNORMAL HIGH (ref 4.0–10.5)

## 2015-08-22 LAB — BASIC METABOLIC PANEL
ANION GAP: 7 (ref 5–15)
BUN: 5 mg/dL — AB (ref 6–20)
CALCIUM: 8.5 mg/dL — AB (ref 8.9–10.3)
CO2: 29 mmol/L (ref 22–32)
Chloride: 104 mmol/L (ref 101–111)
Creatinine, Ser: 0.73 mg/dL (ref 0.44–1.00)
GFR calc Af Amer: >60 mL/min (ref >60)
GFR calc non Af Amer: >60 mL/min (ref >60)
GLUCOSE: 102 mg/dL — AB (ref 65–99)
Potassium: 4.6 mmol/L (ref 3.5–5.1)
Sodium: 140 mmol/L (ref 135–145)

## 2015-08-22 MED ORDER — OXYCODONE-ACETAMINOPHEN 5-325 MG PO TABS: 1.0000 | ORAL_TABLET | ORAL | Status: DC | PRN

## 2015-08-22 MED ORDER — BISACODYL 10 MG RE SUPP
10.0000 mg | Freq: Once | RECTAL | Status: AC
  Administered 2015-08-22: 10 mg via RECTAL
  Filled 2015-08-22: qty 1

## 2015-08-22 MED ORDER — IBUPROFEN 600 MG PO TABS: 600.0000 mg | ORAL_TABLET | Freq: Four times a day (QID) | ORAL | Status: DC | PRN

## 2015-08-22 MED ORDER — SIMETHICONE 80 MG PO CHEW
80.0000 mg | CHEWABLE_TABLET | Freq: Four times a day (QID) | ORAL | Status: DC | PRN
  Administered 2015-08-22 – 2015-08-23 (×4): 80 mg via ORAL
  Filled 2015-08-22 (×4): qty 1

## 2015-08-22 MED ORDER — ONDANSETRON HCL 4 MG PO TABS: 4.0000 mg | ORAL_TABLET | Freq: Four times a day (QID) | ORAL | Status: DC | PRN

## 2015-08-22 NOTE — Anesthesia Postprocedure Evaluation (Signed)
Anesthesia Post Note  Patient: Janice Zuniga  Procedure(s) Performed: Procedure(s) (LRB): ROBOTIC ASSISTED TOTAL HYSTERECTOMY  (Bilateral) CYSTOSCOPY (N/A) BILATERAL SALPINGECTOMY (Bilateral)  Patient location during evaluation: [facility] Anesthesia Type: General Level of consciousness: awake and alert Pain management: pain level controlled Vital Signs Assessment: post-procedure vital signs reviewed and stable Respiratory status: spontaneous breathing Cardiovascular status: blood pressure returned to baseline Postop Assessment: No signs of nausea or vomiting Anesthetic complications: no    Last Vitals:  Filed Vitals:   08/22/15 0130 08/22/15 0536  BP: 108/55 113/60  Pulse: 65 54  Temp: 37.4 C 37 C  Resp: 14 18    Last Pain:  Filed Vitals:   08/22/15 0649  PainSc: 0-No pain                 Cauy Melody

## 2015-08-22 NOTE — Progress Notes (Signed)
1 Day Post-Op Procedure(s) (LRB): ROBOTIC ASSISTED TOTAL HYSTERECTOMY  (Bilateral) CYSTOSCOPY (N/A) BILATERAL SALPINGECTOMY (Bilateral)  Subjective: Patient reports nausea.  Received Zofran.  No flatus yet.  Received Dulcolax suppository.  Ambulating. States she does well with Motrin medication.    Husband is present at bedside.   Objective: I have reviewed patient's vital signs and intake and output.  General: alert and cooperative GI: incision: clean, dry and intact and soft and distended, no guarding or rebound.   Assessment: s/p Procedure(s): ROBOTIC ASSISTED TOTAL HYSTERECTOMY  (Bilateral) CYSTOSCOPY (N/A) BILATERAL SALPINGECTOMY (Bilateral): post op nausea.  Plan: Advance diet Advance to PO medication Simethicone.   Discharge instructions and precautions reviewed.  Rx for Percocet, Motrin, and Zofran.  Surgical findings and procedure have been reviewed with patient.  Anticipated discharge this afternoon.    LOS: 1 day    Arloa Koh 08/22/2015, 12:44 PM

## 2015-08-22 NOTE — Discharge Instructions (Signed)
Total Laparoscopic Hysterectomy A total laparoscopic hysterectomy is a minimally invasive surgery to remove your uterus and cervix. This surgery is performed by making several small cuts (incisions) in your abdomen. It can also be done with a thin, lighted tube (laparoscope) inserted into two small incisions in your lower abdomen. Your fallopian tubes and ovaries can be removed (bilateral salpingo-oophorectomy) during this surgery as well.Benefits of minimally invasive surgery include:  Less pain.  Less risk of blood loss.  Less risk of infection.  Quicker return to normal activities. LET YOUR HEALTH CARE PROVIDER KNOW ABOUT:  Any allergies you have.  All medicines you are taking, including vitamins, herbs, eye drops, creams, and over-the-counter medicines.  Previous problems you or members of your family have had with the use of anesthetics.  Any blood disorders you have.  Previous surgeries you have had.  Medical conditions you have. RISKS AND COMPLICATIONS  Generally, this is a safe procedure. However, as with any procedure, complications can occur. Possible complications include:  Bleeding.  Blood clots in the legs or lung.  Infection.  Injury to surrounding organs.  Problems with anesthesia.  Early menopause symptoms (hot flashes, night sweats, insomnia).  Risk of conversion to an open abdominal incision. BEFORE THE PROCEDURE  Ask your health care provider about changing or stopping your regular medicines.  Do not take aspirin or blood thinners (anticoagulants) for 1 week before the surgery or as told by your health care provider.  Do not eat or drink anything for 8 hours before the surgery or as told by your health care provider.  Quit smoking if you smoke.  Arrange for a ride home after surgery and for someone to help you at home during recovery. PROCEDURE   You will be given antibiotic medicine.  An IV tube will be placed in your arm. You will be given  medicine to make you sleep (general anesthetic).  A gas (carbon dioxide) will be used to inflate your abdomen. This will allow your surgeon to look inside your abdomen, perform your surgery, and treat any other problems found if necessary.  Three or four small incisions (often less than 1/2 inch) will be made in your abdomen. One of these incisions will be made in the area of your belly button (navel). The laparoscope will be inserted into the incision. Your surgeon will look through the laparoscope while doing your procedure.  Other surgical instruments will be inserted through the other incisions.  Your uterus may be removed through your vagina or cut into small pieces and removed through the small incisions.  Your incisions will be closed. AFTER THE PROCEDURE  The gas will be released from inside your abdomen.  You will be taken to the recovery area where a nurse will watch and check your progress. Once you are awake, stable, and taking fluids well, without other problems, you will return to your room or be allowed to go home.  There is usually minimal discomfort following the surgery because the incisions are so small.  You will be given pain medicine while you are in the hospital and for when you go home.   This information is not intended to replace advice given to you by your health care provider. Make sure you discuss any questions you have with your health care provider.   Document Released: 07/07/2007 Document Revised: 05/12/2013 Document Reviewed: 03/30/2013 Elsevier Interactive Patient Education 2016 [employer]  

## 2015-08-22 NOTE — Addendum Note (Signed)
Addendum  created 08/22/15 6808 by Asher Muir, CRNA   Modules edited: Clinical Notes   Clinical Notes:  File: [id number]

## 2015-08-22 NOTE — Progress Notes (Signed)
1 Day Post-Op Procedure(s) (LRB): ROBOTIC ASSISTED TOTAL HYSTERECTOMY  (Bilateral) CYSTOSCOPY (N/A) BILATERAL SALPINGECTOMY (Bilateral)  Subjective: Patient reports gas pain.  Ambulating.  Foley out.   Objective: I have reviewed patient's vital signs, intake and output and labs. T max 99.3, T now 98.6, BP 113/60, P 100, RR 18. I/O - 2760 cc/1650 cc.  Hgb 11.0.  WBC 11.8. K 4.6   General: alert and cooperative Resp: clear to auscultation bilaterally Cardio: regular rate and rhythm, S1, S2 normal, no murmur, click, rub or gallop GI: soft, non-tender; bowel sounds normal; no masses,  no organomegaly and incision: clean, dry and intact Extremities: Ted hose on. Vaginal Bleeding: none  Assessment: s/p Procedure(s): ROBOTIC ASSISTED TOTAL HYSTERECTOMY  (Bilateral) CYSTOSCOPY (N/A) BILATERAL SALPINGECTOMY (Bilateral): progressing well  Plan: Advance diet Encourage ambulation Advance to PO medication Discontinue IV fluids Dulcolax suppository.   Discharge to home today.    LOS: 1 day    Arloa Koh 08/22/2015, 8:39 AM

## 2015-08-23 DIAGNOSIS — N920 Excessive and frequent menstruation with regular cycle: Secondary | ICD-10-CM | POA: Diagnosis not present

## 2015-08-23 LAB — COMPREHENSIVE METABOLIC PANEL
ALK PHOS: 41 U/L (ref 38–126)
ALT: 24 U/L (ref 14–54)
AST: 22 U/L (ref 15–41)
Albumin: 2.4 g/dL — ABNORMAL LOW (ref 3.5–5.0)
Anion gap: 8 (ref 5–15)
BUN: 6 mg/dL (ref 6–20)
CALCIUM: 7.9 mg/dL — AB (ref 8.9–10.3)
CO2: 26 mmol/L (ref 22–32)
CREATININE: 0.65 mg/dL (ref 0.44–1.00)
Chloride: 103 mmol/L (ref 101–111)
GFR calc non Af Amer: >60 mL/min (ref >60)
GLUCOSE: 89 mg/dL (ref 65–99)
Potassium: 4 mmol/L (ref 3.5–5.1)
SODIUM: 137 mmol/L (ref 135–145)
Total Bilirubin: 0.5 mg/dL (ref 0.3–1.2)
Total Protein: 6 g/dL — ABNORMAL LOW (ref 6.5–8.1)

## 2015-08-23 LAB — CBC WITH DIFFERENTIAL/PLATELET
BASOS ABS: 0 10*3/uL (ref 0.0–0.1)
Basophils Relative: 0 %
EOS ABS: 0 10*3/uL (ref 0.0–0.7)
Eosinophils Relative: 0 %
HCT: 30.8 % — ABNORMAL LOW (ref 36.0–46.0)
HEMOGLOBIN: 10.2 g/dL — AB (ref 12.0–15.0)
LYMPHS ABS: 1.4 10*3/uL (ref 0.7–4.0)
LYMPHS PCT: 16 %
MCH: 24.8 pg — AB (ref 26.0–34.0)
MCHC: 33.1 g/dL (ref 30.0–36.0)
MCV: 74.9 fL — ABNORMAL LOW (ref 78.0–100.0)
Monocytes Absolute: 0.5 10*3/uL (ref 0.1–1.0)
Monocytes Relative: 5 %
NEUTROS PCT: 79 %
Neutro Abs: 6.7 10*3/uL (ref 1.7–7.7)
Platelets: 349 10*3/uL (ref 150–400)
RBC: 4.11 MIL/uL (ref 3.87–5.11)
RDW: 18.3 % — ABNORMAL HIGH (ref 11.5–15.5)
WBC: 8.6 10*3/uL (ref 4.0–10.5)

## 2015-08-23 MED ORDER — BISACODYL 10 MG RE SUPP
10.0000 mg | Freq: Once | RECTAL | Status: AC
  Administered 2015-08-23: 10 mg via RECTAL
  Filled 2015-08-23: qty 1

## 2015-08-23 NOTE — Progress Notes (Signed)
Pt out in wheelchair teaching complete

## 2015-08-23 NOTE — Progress Notes (Signed)
2 Days Post-Op Procedure(s) (LRB): ROBOTIC ASSISTED TOTAL HYSTERECTOMY  (Bilateral) CYSTOSCOPY (N/A) BILATERAL SALPINGECTOMY (Bilateral)  Subjective: Patient reports nausea.   Discharge cancelled last evening due to nausea and not taking po well.  States that her abdominal pressure is lessening.  No flatus but had some liquid stool when voided.  No vomiting.  Is burping.  No real pain but did get one injection of morphine during the night to help patient sleep and rest. Ambulating.   Objective: I have reviewed patient's vital signs, intake and output and labs. Tmax 98.6 = T now, Bp 107/52, P 57, RR 18. I/O -  740 cc/1350 cc WBC 8.6, Hgb 10.2. K 4.0 Cr 0.65. Calcium 7.9.  General: alert and cooperative Resp: clear to auscultation bilaterally Cardio: regular rate and rhythm, S1, S2 normal, no murmur, click, rub or gallop GI: soft, non-tender; bowel sounds normal; no masses,  no organomegaly and incision: clean, dry and intact Extremities: Ted hose on.  Vaginal Bleeding: minimal Abdomen is distended but with normal active bowel sounds and nontender to palpation throughout.   Assessment: s/p Procedure(s): ROBOTIC ASSISTED TOTAL HYSTERECTOMY  (Bilateral) CYSTOSCOPY (N/A) BILATERAL SALPINGECTOMY (Bilateral): ileus present  No acute abdomen.  Slightly lower hemoglobin which I think reflects the patient's true EBL from surgery.   Plan: Advance diet Encourage ambulation Dulcolax suppository.   Anticipated discharge later today.  Instructions and precautions have already been given.  Has follow up in 5 days in office.    LOS: 2 days    Arloa Koh 08/23/2015, 7:51 AM

## 2015-08-24 ENCOUNTER — Telehealth: Payer: Self-pay | Admitting: Emergency Medicine

## 2015-08-24 NOTE — Telephone Encounter (Signed)
Call to patient. She states she is doing much better and feeling improved. She has had a bowel movement and feels more comfortable. Has increased walking at home.  She states she is having some chest congestion and nasal congestion. Denies fevers. She states she will call back if develops fevers or any other concerns.  Verbalized understanding of message from Dr. Quincy Simmonds regarding surgical pathology and confirmed appointment for Monday with Dr. Quincy Simmonds for one week post op. Routing to provider for final review. Patient agreeable to disposition. Will close encounter.

## 2015-08-24 NOTE — Telephone Encounter (Signed)
-----   Message from Nunzio Cobbs, MD sent at 08/23/2015  8:28 PM EST ----- Please report to patient her surgical pathology showing fibroids and benign uterus, cervix, and tubes. I would like to know how she is doing with her recovery from surgery.  How is her return to bowel function coming along?

## 2015-08-25 ENCOUNTER — Ambulatory Visit: Payer: Federal, State, Local not specified - PPO | Admitting: Obstetrics and Gynecology

## 2015-08-28 ENCOUNTER — Ambulatory Visit: Payer: Federal, State, Local not specified - PPO | Admitting: Obstetrics and Gynecology

## 2015-08-28 ENCOUNTER — Ambulatory Visit (INDEPENDENT_AMBULATORY_CARE_PROVIDER_SITE_OTHER): Payer: Federal, State, Local not specified - PPO | Admitting: Obstetrics and Gynecology

## 2015-08-28 ENCOUNTER — Encounter: Payer: Self-pay | Admitting: Obstetrics and Gynecology

## 2015-08-28 VITALS — BP 112/70 | HR 88 | Resp 16 | Ht 65.0 in | Wt 102.0 lb

## 2015-08-28 DIAGNOSIS — D649 Anemia, unspecified: Secondary | ICD-10-CM

## 2015-08-28 LAB — CBC
HCT: 34.1 % — ABNORMAL LOW (ref 36.0–46.0)
Hemoglobin: 10.8 g/dL — ABNORMAL LOW (ref 12.0–15.0)
MCH: 24.3 pg — ABNORMAL LOW (ref 26.0–34.0)
MCHC: 31.7 g/dL (ref 30.0–36.0)
MCV: 76.6 fL — ABNORMAL LOW (ref 78.0–100.0)
MPV: 8.9 fL (ref 8.6–12.4)
PLATELETS: 396 10*3/uL (ref 150–400)
RBC: 4.45 MIL/uL (ref 3.87–5.11)
RDW: 17.2 % — AB (ref 11.5–15.5)
WBC: 6.9 10*3/uL (ref 4.0–10.5)

## 2015-08-28 NOTE — Progress Notes (Signed)
Patient ID: Janice Zuniga, female   DOB: [birth year], 44 y.o.   MRN: [id number]  GYNECOLOGY  VISIT   HPI: 44 y.o.   Married  Caucasian  female   [phone] with Patient's last menstrual period was 08/07/2015.   here for post op check.  Status post robotic total laparoscopic hysterectomy with bilateral salpingectomy and cystoscopy on 08/21/15.  Discharge Hgb 10.2.  Bowel function returned the day after discharge.  No vaginal bleeding.  No dizziness or lightheadedness.  Not taking Percocet.  Using ibuprofen twice a day.  Is ambulating at home.   Some numbness of left hand 2nd digit.  IV was in her left.  No other numbness.  No weakness.   GYNECOLOGIC HISTORY: Patient's last menstrual period was 08/07/2015.    OB History    Gravida Para Term Preterm AB TAB SAB Ectopic Multiple Living   5 3 3       3          Patient Active Problem List   Diagnosis Date Noted  . Status post laparoscopic hysterectomy 08/21/2015  . Low calcium levels 07/14/2015  . 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 Dyspnea 07/11/2014  . Upper airway cough syndrome 07/11/2014  . Iron deficiency anemia 07/11/2014  . HLA B27 (HLA B27 positive) 07/06/2014  . Low serum vitamin D   . Non-celiac gluten sensitivity   . Spondyloarthritis     Past Medical History  Diagnosis Date  . Chicken pox   . UTI (lower urinary tract infection)   . Low serum vitamin D   . Non-celiac gluten sensitivity   . Abnormal uterine bleeding   . Anemia   . Genital warts   . History of chlamydia   . HSV-1 (herpes simplex virus 1) infection   . Colitis   . Reactive airway disease   . Low calcium levels   . Dysrhythmia     occassional fluttering noted by patient  . Spondyloarthritis     genetic marker  . Stomach ulcer     Past Surgical History  Procedure Laterality Date  . None    . Mouth surgery      cyst in mouth  . Wisdom tooth extraction    . Colonoscopy    . Robotic assisted total hysterectomy with  salpingectomy Bilateral 08/21/2015    Procedure: ROBOTIC ASSISTED TOTAL HYSTERECTOMY ;  Surgeon: Nunzio Cobbs, MD;  Location: [facility];  Service: Gynecology;  Laterality: Bilateral;  . Cystoscopy N/A 08/21/2015    Procedure: CYSTOSCOPY;  Surgeon: Nunzio Cobbs, MD;  Location: [facility];  Service: Gynecology;  Laterality: N/A;  . Bilateral salpingectomy Bilateral 08/21/2015    Procedure: BILATERAL SALPINGECTOMY;  Surgeon: Nunzio Cobbs, MD;  Location: [facility];  Service: Gynecology;  Laterality: Bilateral;    Current Outpatient Prescriptions  Medication Sig Dispense Refill  . Cholecalciferol (VITAMIN D3) 10000 UNITS TABS Take 1 tablet by mouth 2 (two) times daily.    . Digestive Enzyme CAPS Take 1 capsule by mouth 3 (three) times daily with meals.    Marland Kitchen ibuprofen (ADVIL,MOTRIN) 600 MG tablet Take 1 tablet (600 mg total) by mouth every 6 (six) hours as needed (mild pain). 30 tablet 0  . Mesalamine (ASACOL HD) 800 MG TBEC Take 1,600 mg by mouth 2 (two) times daily.    . mometasone-formoterol (DULERA) 100-5 MCG/ACT AERO Inhale 2 puffs into the lungs 2 (two) times daily. 1 Inhaler 0  . PROAIR HFA 108 (  90 BASE) MCG/ACT inhaler Inhale 2 puffs into the lungs every 4 (four) hours as needed.    . Multiple Vitamins-Minerals (HM MULTIVITAMIN ADULT GUMMY PO) Take 3 each by mouth daily.     No current facility-administered medications for this visit.     ALLERGIES: Gluten meal; Codeine; and Peanut-containing drug products  Family History  Problem Relation Age of Onset  . Hypertension Mother   . Diabetes Mother     ? father  . Cancer Paternal Grandmother     ? type  . Diabetes Father     Social History   Social History  . Marital Status: Married    Spouse Name: N/A  . Number of Children: N/A  . Years of Education: N/A   Occupational History  . Not on file.   Social History Main Topics  . Smoking status: Never Smoker   . Smokeless tobacco: Never Used  .  Alcohol Use: 1.2 oz/week    2 Standard drinks or equivalent per week  . Drug Use: No  . Sexual Activity:    Partners: Male    Birth Control/ Protection: OCP, Pill     Comment: Camrese   Other Topics Concern  . 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 [name]. [name].    Got 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  PHYSICAL EXAMINATION:    BP 112/70 mmHg  Pulse 88  Resp 16  Ht 5' 5"  (1.651 m)  Wt 102 lb (46.267 kg)  BMI 16.97 kg/m2  LMP 08/07/2015    General appearance: alert, cooperative and appears stated age Abdomen: Incisions intact, soft, non-tender; bowel sounds normal; no masses,  no organomegaly Neuro - strength in bilateral upper extremities 5/5 throughout.  Chaperone was present for exam.  ASSESSMENT  Doing well status post robotic TLH and bilateral salpingectomy.  Anemia post op. Numbness from IV site?  PLAN  Counseled regarding activity level post op.  Check CBC today.   Follow up for 6 week post op visit.  Call if numbness in hand does not improve.   An After Visit Summary was printed and given to the patient.

## 2015-08-29 NOTE — Discharge Summary (Signed)
Physician Discharge Summary  Patient ID: Janice Zuniga MRN: [id number] DOB/AGE: 1971/03/19 44 y.o.  Admit date: 08/21/2015 Discharge date:  08/23/15 Admission Diagnoses: 1.  Menorrhagia with irregular bleeding. 2.  Uterine fibroids.  3.  Dysmenorrhea. 4.  Anemia.   Discharge Diagnoses:  1.  Menorrhagia with irregular bleeding. 2.  Uterine fibroids.  3.  Dysmenorrhea. 4.  Anemia.  5.  Status post robotic total laparoscopic hysterectomy with bilateral salpingectomy and cystoscopy. 6.  Mild post op ileus.  Active Problems:   Status post laparoscopic hysterectomy   Discharged Condition: good  Hospital Course:  The patient was admitted on 08/21/15  for a robotic total laparoscopic hysterectomy with bilateral salpingectomy and cystoscopy which were performed without complication while under general anesthesia.  The patient's post op course was notable for a mild post op ileus.  She had a morphine for pain control initially, and this was converted over to Percocet and Motrin on post op day one.  The patient had slow return to bowel function and she reported gas pains.  She received a Dulcolax suppository on post op day one and then some simethicone.  She developed nausea and was not taking po well, so her IVF were continued and she stayed in the hospital an additional day for in house observation.  She then did have some liquid stool, and received an additional Dulcolax suppository to help pass flatus.  She had some abdominal distention without signs of a surgical abdomen.  She had normal bowel sounds.  Her vitals remained stable.  She ambulated independently and wore PAS and Ted hose for DVT prophylaxis while in bed.  Her foley catheter were removed on post op day one, and she voided good volumes. The patient's vital signs remained stable and she demonstrated no signs of infection during her hospitalization.  The patient's post op day two Hgb was 10.2.   She was tolerating the this well.  She  had very minimal vaginal bleeding, and her incision(s) demonstrated no signs of erythema or significant drainage.  She was found to be in good condition and ready for discharge on post op day two.      Consults: None  Significant Diagnostic Studies: labs:  See hospital course.  Treatments: surgery:  robotic total laparoscopic hysterectomy with bilateral salpingectomy and cystoscopy.  Discharge Exam: Blood pressure 125/75, pulse 56, temperature 98.8 F (37.1 C), temperature source Oral, resp. rate 18, height 5' 3"  (1.6 m), weight 98 lb (44.453 kg), last menstrual period 08/07/2015, SpO2 100 %. General: alert and cooperative Resp: clear to auscultation bilaterally Cardio: regular rate and rhythm, S1, S2 normal, no murmur, click, rub or gallop GI: soft, non-tender; bowel sounds normal; no masses, no organomegaly and incision: clean, dry and intact Extremities: Ted hose on.  Vaginal Bleeding: minimal Abdomen is distended but with normal active bowel sounds and nontender to palpation throughout.    Disposition: 01-Home or Self Care  Instructions and precautions were given in both verbal and written form.     Medication List    STOP taking these medications        acetaminophen 325 MG tablet  Commonly known as:  TYLENOL     Ferrous Fumarate 29 MG Tabs     levonorgestrel-ethinyl estradiol 0.15-0.03 MG tablet  Commonly known as:  SEASONALE,INTROVALE,JOLESSA      TAKE these medications        ASACOL HD 800 MG Tbec  Generic drug:  Mesalamine  Take 1,600 mg by mouth 2 (two)  times daily.     Digestive Enzyme Caps  Take 1 capsule by mouth 3 (three) times daily with meals.     HM MULTIVITAMIN ADULT GUMMY PO  Take 3 each by mouth daily.     ibuprofen 600 MG tablet  Commonly known as:  ADVIL,MOTRIN  Take 1 tablet (600 mg total) by mouth every 6 (six) hours as needed (mild pain).     mometasone-formoterol 100-5 MCG/ACT Aero  Commonly known as:  DULERA  Inhale 2 puffs into  the lungs 2 (two) times daily.     PROAIR HFA 108 (90 BASE) MCG/ACT inhaler  Generic drug:  albuterol  Inhale 2 puffs into the lungs every 4 (four) hours as needed.     Vitamin D3 10000 UNITS Tabs  Take 1 tablet by mouth 2 (two) times daily.      Percocet 5 mg/325 mg.  Take 1 - 2 by mouth every 4 - 6 hours as needed for pain.      Follow-up Information    Follow up with Arloa Koh, MD In 1 week.   Specialty:  Obstetrics and Gynecology   Contact information:   [address] [place] [address] [phone]       Signed: Arloa Koh 08/29/2015, 5:56 PM

## 2015-09-07 ENCOUNTER — Encounter: Payer: Self-pay | Admitting: Behavioral Health

## 2015-09-07 ENCOUNTER — Telehealth: Payer: Self-pay | Admitting: Behavioral Health

## 2015-09-07 NOTE — Telephone Encounter (Signed)
Pre-Visit Call completed with patient and chart updated.   Pre-Visit Info documented in Specialty Comments under [employer].    

## 2015-09-08 ENCOUNTER — Encounter: Payer: Self-pay | Admitting: Family

## 2015-09-08 ENCOUNTER — Ambulatory Visit (INDEPENDENT_AMBULATORY_CARE_PROVIDER_SITE_OTHER): Payer: Federal, State, Local not specified - PPO | Admitting: Family

## 2015-09-08 VITALS — BP 102/71 | HR 89 | Temp 97.9°F | Resp 20 | Ht 62.75 in | Wt 93.4 lb

## 2015-09-08 DIAGNOSIS — Z8739 Personal history of other diseases of the musculoskeletal system and connective tissue: Secondary | ICD-10-CM | POA: Diagnosis not present

## 2015-09-08 DIAGNOSIS — D649 Anemia, unspecified: Secondary | ICD-10-CM | POA: Diagnosis not present

## 2015-09-08 DIAGNOSIS — E559 Vitamin D deficiency, unspecified: Secondary | ICD-10-CM | POA: Insufficient documentation

## 2015-09-08 DIAGNOSIS — K9041 Non-celiac gluten sensitivity: Secondary | ICD-10-CM | POA: Diagnosis not present

## 2015-09-08 LAB — TSH: TSH: 0.57 u[IU]/mL (ref 0.35–4.50)

## 2015-09-08 LAB — BASIC METABOLIC PANEL
BUN: 13 mg/dL (ref 6–23)
CALCIUM: 9.4 mg/dL (ref 8.4–10.5)
CHLORIDE: 104 meq/L (ref 96–112)
CO2: 31 mEq/L (ref 19–32)
Creatinine, Ser: 0.62 mg/dL (ref 0.40–1.20)
GFR: 133.83 mL/min (ref >60.00)
GLUCOSE: 92 mg/dL (ref 70–99)
Potassium: 5.2 mEq/L — ABNORMAL HIGH (ref 3.5–5.1)
SODIUM: 140 meq/L (ref 135–145)

## 2015-09-08 LAB — CBC WITH DIFFERENTIAL/PLATELET
BASOS PCT: 0.5 % (ref 0.0–3.0)
Basophils Absolute: 0 10*3/uL (ref 0.0–0.1)
EOS ABS: 0.1 10*3/uL (ref 0.0–0.7)
EOS PCT: 1.2 % (ref 0.0–5.0)
HCT: 38.7 % (ref 36.0–46.0)
Hemoglobin: 12.6 g/dL (ref 12.0–15.0)
LYMPHS ABS: 2 10*3/uL (ref 0.7–4.0)
Lymphocytes Relative: 21.9 % (ref 12.0–46.0)
MCHC: 32.6 g/dL (ref 30.0–36.0)
MCV: 77.7 fl — ABNORMAL LOW (ref 78.0–100.0)
MONO ABS: 0.4 10*3/uL (ref 0.1–1.0)
Monocytes Relative: 4.9 % (ref 3.0–12.0)
NEUTROS PCT: 71.5 % (ref 43.0–77.0)
Neutro Abs: 6.5 10*3/uL (ref 1.4–7.7)
PLATELETS: 338 10*3/uL (ref 150.0–400.0)
RBC: 4.98 Mil/uL (ref 3.87–5.11)
RDW: 16.4 % — AB (ref 11.5–15.5)
WBC: 9.1 10*3/uL (ref 4.0–10.5)

## 2015-09-08 LAB — HEPATIC FUNCTION PANEL
ALT: 10 U/L (ref 0–35)
AST: 14 U/L (ref 0–37)
Albumin: 3.6 g/dL (ref 3.5–5.2)
Alkaline Phosphatase: 68 U/L (ref 39–117)
BILIRUBIN TOTAL: 0.4 mg/dL (ref 0.2–1.2)
Bilirubin, Direct: 0.1 mg/dL (ref 0.0–0.3)
Total Protein: 7.4 g/dL (ref 6.0–8.3)

## 2015-09-08 LAB — RHEUMATOID FACTOR: Rhuematoid fact SerPl-aCnc: <10 IU/mL (ref <=14)

## 2015-09-08 LAB — VITAMIN D 25 HYDROXY (VIT D DEFICIENCY, FRACTURES): VITD: 81.73 ng/mL (ref 30.00–100.00)

## 2015-09-08 LAB — SEDIMENTATION RATE: Sed Rate: 26 mm/hr — ABNORMAL HIGH (ref 0–22)

## 2015-09-08 NOTE — Progress Notes (Signed)
Pre visit review using our clinic review tool, if applicable. No additional management support is needed unless otherwise documented below in the visit note. 

## 2015-09-08 NOTE — Progress Notes (Signed)
Subjective:    Patient ID: Janice Zuniga, female    DOB: 11/04/1970, 44 y.o.   MRN: [id number]  HPI  Janice Zuniga is a 44 yr old female who presents today to establish care.   Pmhx is significant for the following:  1) gluten sensitivity- reports 6-7 yrs ago she developed joint stiffness, uveitis left eye. She saw opthalmology and was told that she had Marker for - saw rheumatologist.  Had a fecal test for gluten and antibodies were present.  She is now gluten free.  Reports hx of elevated CRP. She had a + M Spike and was evaluated by Dr. Marin Olp.  She continues to follow with Dr. Marin Olp.  Had colonoscopy performed at [facility]- path showed acute/chronic inflammation- differential ? IBD, infectious colitis and NSAID induced colitis.    2) Vi D deficiency- not currently taking a supplement  3) Anemia (s/p hysterectomy for uterine fibroids)- had hysterectomy 3 weeks ago.  On Oral iron.    4) respiratory tightness- dulera, proair.  Currently without symptoms. She has seen Dr. Melvyn Novas in the past.  Not using consistently.    Review of Systems  Constitutional: Negative for unexpected weight change.  HENT: Negative for rhinorrhea.   Respiratory: Negative for cough.   Gastrointestinal: Negative for diarrhea, constipation and blood in stool.  Genitourinary: Negative for dysuria and frequency.  Musculoskeletal:       Mild low back pain  Skin: Negative for rash.  Neurological: Negative for headaches.  Hematological: Negative for adenopathy.  Psychiatric/Behavioral:       She reports + stress with job and travel       Past Medical History  Diagnosis Date  . Chicken pox   . UTI (lower urinary tract infection)   . Low serum vitamin D   . Non-celiac gluten sensitivity   . Abnormal uterine bleeding   . Anemia   . Genital warts   . History of chlamydia   . HSV-1 (herpes simplex virus 1) infection   . Colitis   . Reactive airway disease   . Low calcium levels   . Dysrhythmia    occassional fluttering noted by patient  . Spondyloarthritis     genetic marker  . Stomach ulcer   . Low iron   . History of hysterectomy 08/21/15    Social History   Social History  . Marital Status: Married    Spouse Name: N/A  . Number of Children: N/A  . Years of Education: N/A   Occupational History  . Not on file.   Social History Main Topics  . Smoking status: Never Smoker   . Smokeless tobacco: Never Used  . Alcohol Use: 1.2 oz/week    2 Standard drinks or equivalent per week  . Drug Use: No  . Sexual Activity:    Partners: Male    Birth Control/ Protection: OCP, Pill     Comment: Camrese   Other Topics Concern  . 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 Janice Zuniga '95. Janice Zuniga 03' Janice Zuniga 05'.    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 None    . Mouth surgery      cyst in  mouth  . Wisdom tooth extraction    . Colonoscopy    . Robotic assisted total hysterectomy with salpingectomy Bilateral 08/21/2015    Procedure: ROBOTIC ASSISTED TOTAL HYSTERECTOMY ;  Surgeon: Nunzio Cobbs, MD;  Location: [facility];  Service: Gynecology;  Laterality: Bilateral;  . Cystoscopy N/A 08/21/2015    Procedure: CYSTOSCOPY;  Surgeon: Nunzio Cobbs, MD;  Location: [facility];  Service: Gynecology;  Laterality: N/A;  . Bilateral salpingectomy Bilateral 08/21/2015    Procedure: BILATERAL SALPINGECTOMY;  Surgeon: Nunzio Cobbs, MD;  Location: [facility];  Service: Gynecology;  Laterality: Bilateral;  . Abdominal hysterectomy  08/21/15    Family History  Problem Relation Age of Onset  . Hypertension Mother   . Diabetes Mother     ? father  . Cancer Paternal Grandmother     ? type  . Diabetes Father   .  Cancer Father     Colon Cancer    Allergies  Allergen Reactions  . Gluten Meal Other (See Comments)    Auto immune  . Codeine Nausea And Vomiting  . Peanut-Containing Drug Products Other (See Comments)    By allergy test...has eaten peanuts without issues     Current Outpatient Prescriptions on File Prior to Visit  Medication Sig Dispense Refill  . Digestive Enzyme CAPS Take 1 capsule by mouth 3 (three) times daily with meals.    . Mesalamine (ASACOL HD) 800 MG TBEC Take 1,600 mg by mouth 2 (two) times daily.    . mometasone-formoterol (DULERA) 100-5 MCG/ACT AERO Inhale 2 puffs into the lungs 2 (two) times daily. (Patient taking differently: Inhale 2 puffs into the lungs 2 (two) times daily as needed. ) 1 Inhaler 0  . Multiple Vitamins-Minerals (HM MULTIVITAMIN ADULT GUMMY PO) Take 3 each by mouth daily.    Marland Kitchen PROAIR HFA 108 (90 BASE) MCG/ACT inhaler Inhale 2 puffs into the lungs every 4 (four) hours as needed.    . Cholecalciferol (VITAMIN D3) 10000 UNITS TABS Take 1 tablet by mouth 2 (two) times daily. Reported on 09/08/2015     No current facility-administered medications on file prior to visit.    BP 102/71 mmHg  Pulse 89  Temp(Src) 97.9 F (36.6 C) (Oral)  Resp 20  Ht 5' 2.75" (1.594 m)  Wt 93 lb 6.4 oz (42.366 kg)  BMI 16.67 kg/m2  SpO2 99%  LMP 08/07/2015    Objective:   Physical Exam  Constitutional: She is oriented to person, place, and time. She appears well-developed and well-nourished.  HENT:  Head: Normocephalic and atraumatic.  Cardiovascular: Normal rate, regular rhythm and normal heart sounds.   No murmur heard. Pulmonary/Chest: Effort normal and breath sounds normal. No respiratory distress. She has no wheezes.  Abdominal: Soft. She exhibits no distension. There is no tenderness.  Musculoskeletal: She exhibits no edema.  Lymphadenopathy:    She has no cervical adenopathy.  Neurological: She is alert and oriented to person, place, and time.  Skin:  Skin is warm and dry.  Psychiatric: She has a normal mood and affect. Her behavior is normal. Judgment and thought content normal.          Assessment & Plan:  History of joint stiffness- will obtain ANA, RA, ESR, refer to rheumatology for second opinion.

## 2015-09-08 NOTE — Patient Instructions (Signed)
Please complete lab work prior to leaving. Schedule a complete physical after the holidays. Welcome to [facility]!

## 2015-09-08 NOTE — Assessment & Plan Note (Signed)
Obtain vit D level.

## 2015-09-08 NOTE — Assessment & Plan Note (Signed)
Suspect H/H will improve following her hysterectomy.

## 2015-09-08 NOTE — Assessment & Plan Note (Signed)
Continue gluten free diet.

## 2015-09-11 ENCOUNTER — Telehealth: Payer: Self-pay | Admitting: Family

## 2015-09-11 DIAGNOSIS — E875 Hyperkalemia: Secondary | ICD-10-CM

## 2015-09-11 LAB — ANA: ANA: NEGATIVE

## 2015-09-11 NOTE — Telephone Encounter (Signed)
Please let pt know that her potassium is mildly elevated. I would like her to repeat potassium this week, dx hyperkalemia to see if it is truly elevated or not. Rheumatoid and lupus screens negative.  Sed rate mildly elevated but this is non-specific.

## 2015-09-12 NOTE — Telephone Encounter (Signed)
Notified pt and scheduled lab appt for 09/19/15 at 8:30am.  Future order entered.

## 2015-09-15 ENCOUNTER — Ambulatory Visit (INDEPENDENT_AMBULATORY_CARE_PROVIDER_SITE_OTHER): Payer: Federal, State, Local not specified - PPO | Admitting: Family

## 2015-09-15 ENCOUNTER — Telehealth: Payer: Self-pay | Admitting: Family

## 2015-09-15 ENCOUNTER — Encounter: Payer: Self-pay | Admitting: Family

## 2015-09-15 VITALS — BP 119/81 | HR 79 | Temp 98.6°F | Resp 16 | Ht 62.75 in | Wt 93.0 lb

## 2015-09-15 DIAGNOSIS — E875 Hyperkalemia: Secondary | ICD-10-CM | POA: Diagnosis not present

## 2015-09-15 DIAGNOSIS — F32A Depression, unspecified: Secondary | ICD-10-CM

## 2015-09-15 DIAGNOSIS — F329 Major depressive disorder, single episode, unspecified: Secondary | ICD-10-CM

## 2015-09-15 LAB — BASIC METABOLIC PANEL
BUN: 10 mg/dL (ref 7–25)
CALCIUM: 9.4 mg/dL (ref 8.6–10.2)
CO2: 23 mmol/L (ref 20–31)
Chloride: 104 mmol/L (ref 98–110)
Creat: 0.6 mg/dL (ref 0.50–1.10)
GLUCOSE: 80 mg/dL (ref 65–99)
Potassium: 4.4 mmol/L (ref 3.5–5.3)
Sodium: 139 mmol/L (ref 135–146)

## 2015-09-15 MED ORDER — SERTRALINE HCL 50 MG PO TABS: ORAL_TABLET | ORAL | Status: DC

## 2015-09-15 NOTE — Patient Instructions (Signed)
Please start zoloft 1/2 tab once daily for 7 days, then increase to a full tab once daily on week two.   Go to ER or call 911 if you develop thoughts of hurting yourself or others. Please contact [facility] and set up an appointment with one of our counselors. Arville Lime  Phone: [phone]

## 2015-09-15 NOTE — Progress Notes (Signed)
Pre visit review using our clinic review tool, if applicable. No additional management support is needed unless otherwise documented below in the visit note. 

## 2015-09-15 NOTE — Progress Notes (Signed)
Subjective:    Patient ID: Janice Zuniga, female    DOB: 1971-03-20, 44 y.o.   MRN: [id number]  HPI  Janice Zuniga is a 44 yr old female who presents today to discuss depression. Reports that in the last 2 weeks she has been tearful and more sensitive.  Not sure if it is related to her recent surgery.  Sleeping "fair."  Tosses and turns a lot. Wants to isolate from everyone.  Last week she stayed at a hotel for a day and a half- just to get away.  She reports that she has had depression in the past- though she has not been treated with medications.  Feels like she is in a "slump." Denis SI but has had thoughts that her family might be better off without her or wondering if "life is worth living."  She does not report any specific plan.  Reports no specific stressors.  Not sure why she is feeling this way.   Review of Systems See HPI  Past Medical History  Diagnosis Date  . Chicken pox   . UTI (lower urinary tract infection)   . Low serum vitamin D   . Non-celiac gluten sensitivity   . Abnormal uterine bleeding   . Anemia   . Genital warts   . History of chlamydia   . HSV-1 (herpes simplex virus 1) infection   . Colitis   . Reactive airway disease   . Low calcium levels   . Dysrhythmia     occassional fluttering noted by patient  . Spondyloarthritis     genetic marker  . Stomach ulcer   . Low iron   . History of hysterectomy 08/21/15    Social History   Social History  . Marital Status: Married    Spouse Name: N/A  . Number of Children: N/A  . Years of Education: N/A   Occupational History  . Not on file.   Social History Main Topics  . Smoking status: Never Smoker   . Smokeless tobacco: Never Used  . Alcohol Use: 1.2 oz/week    2 Standard drinks or equivalent per week  . Drug Use: No  . Sexual Activity:    Partners: Male    Birth Control/ Protection: OCP, Pill     Comment: Camrese   Other Topics Concern  . 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 [name]. [name].    Got 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 None    . Mouth surgery      cyst in mouth  . Wisdom tooth extraction    . Colonoscopy    . Robotic assisted total hysterectomy with salpingectomy Bilateral 08/21/2015    Procedure: ROBOTIC ASSISTED TOTAL HYSTERECTOMY ;  Surgeon: Nunzio Cobbs, MD;  Location: [facility];  Service: Gynecology;  Laterality: Bilateral;  . Cystoscopy N/A 08/21/2015    Procedure: CYSTOSCOPY;  Surgeon: Nunzio Cobbs, MD;  Location: [facility];  Service: Gynecology;  Laterality: N/A;  . Bilateral salpingectomy Bilateral 08/21/2015    Procedure: BILATERAL SALPINGECTOMY;  Surgeon: Nunzio Cobbs, MD;  Location: [facility];  Service: Gynecology;  Laterality: Bilateral;  . Abdominal hysterectomy  08/21/15    Family History  Problem Relation Age of Onset  . Hypertension Mother   . Diabetes Mother     ? father  . Cancer Paternal Grandmother     ? type  . Diabetes Father   . Cancer Father     Colon Cancer    Allergies  Allergen Reactions  . Gluten Meal Other (See Comments)    Auto immune  . Codeine Nausea And Vomiting  . Peanut-Containing Drug Products Other (See Comments)    By allergy test...has eaten peanuts without issues     Current Outpatient Prescriptions on File Prior to Visit  Medication Sig Dispense Refill  . Digestive Enzyme CAPS Take 1 capsule by mouth 3 (three) times daily with meals.    . Iron Combinations (IRON COMPLEX PO) Take 87 mg by mouth daily.    . mometasone-formoterol (DULERA) 100-5 MCG/ACT AERO Inhale 2 puffs into the lungs 2 (two) times daily. (Patient taking differently: Inhale 2 puffs into the lungs 2 (two) times daily as needed. ) 1  Inhaler 0  . PROAIR HFA 108 (90 BASE) MCG/ACT inhaler Inhale 2 puffs into the lungs every 4 (four) hours as needed.    . Mesalamine (ASACOL HD) 800 MG TBEC Take 1,600 mg by mouth 2 (two) times daily.     No current facility-administered medications on file prior to visit.    BP 119/81 mmHg  Pulse 79  Temp(Src) 98.6 F (37 C) (Oral)  Resp 16  Ht 5' 2.75" (1.594 m)  Wt 93 lb (42.185 kg)  BMI 16.60 kg/m2  SpO2 100%  LMP 08/07/2015       Objective:   Physical Exam  Constitutional: She is oriented to person, place, and time. She appears well-nourished.  Thin appearing AA female.   HENT:  Head: Normocephalic and atraumatic.  Musculoskeletal: She exhibits no edema.  Neurological: She is alert and oriented to person, place, and time.  Psychiatric: Her behavior is normal. Judgment and thought content normal. She exhibits a depressed mood.  tearful          Assessment & Plan:   Wt Readings from Last 3 Encounters:  09/15/15 93 lb (42.185 kg)  09/08/15 93 lb 6.4 oz (42.366 kg)  08/28/15 102 lb (46.267 kg)  08/15/15         98 pounds

## 2015-09-15 NOTE — Telephone Encounter (Signed)
Notified pt and scheduled appt for today at 2pm.

## 2015-09-15 NOTE — Assessment & Plan Note (Signed)
New. Will initiate zoloft 57m.  I instructed pt to start 1/2 tablet once daily for 1 week and then increase to a full tablet once daily on week two as tolerated.  We discussed common side effects such as nausea, drowsiness and weight gain.  Also discussed rare but serious side effect of suicide ideation.  She is instructed to discontinue medication go directly to ED if this occurs.  Pt verbalizes understanding.  Plan follow up in 1 month to evaluate progress.    I have also recommended that patient establish with a counselor. She is agreeable to do so.  She verbally contracts that she will call 911 or go to the ED if she has serious thoughts of hurting herself or others.   25 minutes spent with pt today. >50% of this time was spent counseling patient on her depression and anxiety.

## 2015-09-15 NOTE — Telephone Encounter (Signed)
I would recommend that she be seen in the office please.  I would also recommend that she contact her GYN Dr. Quincy Simmonds to discuss possibility of adding estrogen therapy since records show that her ovaries were removed.

## 2015-09-15 NOTE — Telephone Encounter (Signed)
Caller name: Lesa Relationship to patient: self Can be reached: [phone] Pharmacy: [facility], [place]  Reason for call: Pt called stating symptoms of depression. She said she was advised to call. Pt is post hysterectomy and is wanting advice on how to proceed.

## 2015-09-19 ENCOUNTER — Other Ambulatory Visit: Payer: Federal, State, Local not specified - PPO

## 2015-09-29 ENCOUNTER — Encounter: Payer: Self-pay | Admitting: Family

## 2015-09-29 ENCOUNTER — Ambulatory Visit (INDEPENDENT_AMBULATORY_CARE_PROVIDER_SITE_OTHER): Payer: Federal, State, Local not specified - PPO | Admitting: Family

## 2015-09-29 VITALS — BP 96/64 | HR 78 | Temp 97.8°F | Resp 16 | Ht 63.0 in | Wt 93.8 lb

## 2015-09-29 DIAGNOSIS — F329 Major depressive disorder, single episode, unspecified: Secondary | ICD-10-CM

## 2015-09-29 DIAGNOSIS — M545 Low back pain: Secondary | ICD-10-CM

## 2015-09-29 DIAGNOSIS — F32A Depression, unspecified: Secondary | ICD-10-CM

## 2015-09-29 NOTE — Progress Notes (Signed)
Subjective:    Patient ID: Janice Zuniga, female    DOB: 1971-06-15, 45 y.o.   MRN: [id number]  HPI  Janice Zuniga is a 45 yr old female who presents today for follow up of her depression. She was seen on 09/15/15 with report of tearfulness, hopelessness and withdrawing from friends and family.  She was started on zoloft 7m.  We also recommended that she establish with a counselor for further treatment.    She tells me that she decided not to start the zoloft due to concern about side effects.  She tells me that she talked to her husband about some things that were stressing her out about her family and this seemed to really help. She returns to work on Monday and is looking forward to this.  Has an upcoming appointment with a therapist.  Wt Readings from Last 3 Encounters:  09/29/15 93 lb 12.8 oz (42.547 kg)  09/15/15 93 lb (42.185 kg)  09/08/15 93 lb 6.4 oz (42.366 kg)   Reports low back spasms 3 days ago.  Using aleve which is helping. Got a massage which helped.    Review of Systems    see HPI  Past Medical History  Diagnosis Date  . Chicken pox   . UTI (lower urinary tract infection)   . Low serum vitamin D   . Non-celiac gluten sensitivity   . Abnormal uterine bleeding   . Anemia   . Genital warts   . History of chlamydia   . HSV-1 (herpes simplex virus 1) infection   . Colitis   . Reactive airway disease   . Low calcium levels   . Dysrhythmia     occassional fluttering noted by patient  . Spondyloarthritis     genetic marker  . Stomach ulcer   . Low iron   . History of hysterectomy 08/21/15  . Muscle spasms of both lower extremities     Spasms in lower back    Social History   Social History  . Marital Status: Married    Spouse Name: N/A  . Number of Children: N/A  . Years of Education: N/A   Occupational History  . Not on file.   Social History Main Topics  . Smoking status: Never Smoker   . Smokeless tobacco: Never Used  . Alcohol Use: 1.2  oz/week    2 Standard drinks or equivalent per week  . Drug Use: No  . Sexual Activity:    Partners: Male    Birth Control/ Protection: OCP, Pill     Comment: Camrese   Other Topics Concern  . 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 JLarkin Ina'95. [name].    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 None    . Mouth surgery      cyst in mouth  . Wisdom tooth extraction    . Colonoscopy    . Robotic assisted total hysterectomy with salpingectomy Bilateral 08/21/2015    Procedure: ROBOTIC ASSISTED TOTAL HYSTERECTOMY ;  Surgeon: BNunzio Cobbs MD;  [facility];  Service: Gynecology;  Laterality: Bilateral;  . Cystoscopy N/A 08/21/2015  Procedure: CYSTOSCOPY;  Surgeon: Nunzio Cobbs, MD;  Location: [facility];  Service: Gynecology;  Laterality: N/A;  . Bilateral salpingectomy Bilateral 08/21/2015    Procedure: BILATERAL SALPINGECTOMY;  Surgeon: Nunzio Cobbs, MD;  Location: [facility];  Service: Gynecology;  Laterality: Bilateral;  . Abdominal hysterectomy  08/21/15    Family History  Problem Relation Age of Onset  . Hypertension Mother   . Diabetes Mother     ? father  . Cancer Paternal Grandmother     ? type  . Diabetes Father   . Cancer Father     Colon Cancer    Allergies  Allergen Reactions  . Gluten Meal Other (See Comments)    Auto immune  . Codeine Nausea And Vomiting  . Peanut-Containing Drug Products Other (See Comments)    By allergy test...has eaten peanuts without issues     Current Outpatient Prescriptions on File Prior to Visit  Medication Sig Dispense Refill  . Digestive Enzyme CAPS Take 1 capsule by mouth 3 (three) times daily with meals.    . Iron  Combinations (IRON COMPLEX PO) Take 87 mg by mouth daily.    . mometasone-formoterol (DULERA) 100-5 MCG/ACT AERO Inhale 2 puffs into the lungs 2 (two) times daily. (Patient taking differently: Inhale 2 puffs into the lungs 2 (two) times daily as needed. ) 1 Inhaler 0  . PROAIR HFA 108 (90 BASE) MCG/ACT inhaler Inhale 2 puffs into the lungs every 4 (four) hours as needed.    . Mesalamine (ASACOL HD) 800 MG TBEC Take 1,600 mg by mouth 2 (two) times daily.    . sertraline (ZOLOFT) 50 MG tablet 1/2 tab by mouth once daily for 7 days, then increase to a full tab once daily on week two. (Patient not taking: Reported on 09/29/2015) 30 tablet 3   No current facility-administered medications on file prior to visit.    BP 96/64 mmHg  Pulse 78  Temp(Src) 97.8 F (36.6 C) (Oral)  Resp 16  Ht 5' 3"  (1.6 m)  Wt 93 lb 12.8 oz (42.547 kg)  BMI 16.62 kg/m2  SpO2 100%  LMP 08/07/2015    Objective:   Physical Exam  Constitutional: She is oriented to person, place, and time. She appears well-developed and well-nourished. No distress.  Neurological: She is alert and oriented to person, place, and time.  Psychiatric: She has a normal mood and affect. Her behavior is normal. Judgment and thought content normal.          Assessment & Plan:  Low back pain- mild- improving. Advised pt OK to continue aleve 223m bid short term, ok to continue with massages prn, follow up if symptoms worsen or if symptoms do not improve.

## 2015-09-29 NOTE — Patient Instructions (Signed)
Please keep your upcoming appointment with therapist. Try to incorporate healthy high calories foods such as dried fruit and nuts into your diet to facilitate weight gain.  Call if you have recurrent symptoms of depression.

## 2015-09-29 NOTE — Assessment & Plan Note (Signed)
She is improved despite not starting zoloft.  I encouraged her to keep her upcoming apt with the therapist and to contact me if recurrent symptoms. 15 min spent with pt today >50% of this time was spent counseling pt on depression.

## 2015-09-29 NOTE — Progress Notes (Signed)
Pre visit review using our clinic review tool, if applicable. No additional management support is needed unless otherwise documented below in the visit note. 

## 2015-10-02 ENCOUNTER — Ambulatory Visit (INDEPENDENT_AMBULATORY_CARE_PROVIDER_SITE_OTHER): Payer: Federal, State, Local not specified - PPO | Admitting: Obstetrics and Gynecology

## 2015-10-02 ENCOUNTER — Encounter: Payer: Self-pay | Admitting: Obstetrics and Gynecology

## 2015-10-02 VITALS — BP 118/80 | HR 76 | Ht 65.0 in | Wt 94.8 lb

## 2015-10-02 DIAGNOSIS — Z9889 Other specified postprocedural states: Secondary | ICD-10-CM

## 2015-10-02 NOTE — Progress Notes (Signed)
Patient ID: Janice Zuniga, female   DOB: Jan 11, 1971, 45 y.o.   MRN: [id number] GYNECOLOGY  VISIT   HPI: 45 y.o.   Married  [place] American  female   [phone] with Patient's last menstrual period was 08/07/2015.   here for 6 week follow up ROBOTIC ASSISTED TOTAL HYSTERECTOMY (Bilateral Abdomen CYSTOSCOPY (N/A Bladder)  BILATERAL SALPINGECTOMY (Bilateral Abdomen). Surgery on 08/21/15. Back to work today for first day following surgery.   Patient states she saw her PCP 09-15-15 because she was having tearful episodes and feeling depressed and was prescribed Zoloft which she has not taken yet. Referred to see a counselor.  Has an appointment on 10/09/15. Feeling much better now.  Not suicidal now.   Satisfied with having had hysterectomy.   Denies problems with bladder, bowel function. Denies vaginal bleeding.  No pain.   Hgb 12.6 on 09/08/15.  GYNECOLOGIC HISTORY: Patient's last menstrual period was 08/07/2015. Contraception:Hysterectomy Menopausal hormone therapy: none Last mammogram: 03-30-15 Bil.Diag.and US/density cat.C/benign [facility] Last pap smear: 03-31-15 Neg:Neg HR HPV        OB History    Gravida Para Term Preterm AB TAB SAB Ectopic Multiple Living   5 3 3       3          Patient Active Problem List   Diagnosis Date Noted  . Depression 09/15/2015  . Vitamin D deficiency 09/08/2015  . Anemia 09/08/2015  . Status post laparoscopic hysterectomy 08/21/2015  . Low calcium levels 07/14/2015  . 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 Dyspnea 07/11/2014  . Iron deficiency anemia 07/11/2014  . HLA B27 (HLA B27 positive) 07/06/2014  . Low serum vitamin D   . Non-celiac gluten sensitivity   . Spondyloarthritis     Past Medical History  Diagnosis Date  . Chicken pox   . UTI (lower urinary tract infection)   . Low serum vitamin D   . Non-celiac gluten sensitivity   . Abnormal uterine bleeding   . Anemia   .  Genital warts   . History of chlamydia   . HSV-1 (herpes simplex virus 1) infection   . Colitis   . Reactive airway disease   . Low calcium levels   . Dysrhythmia     occassional fluttering noted by patient  . Spondyloarthritis     genetic marker  . Stomach ulcer   . Low iron   . History of hysterectomy 08/21/15  . Muscle spasms of both lower extremities     Spasms in lower back    Past Surgical History  Procedure Laterality Date  . None    . Mouth surgery      cyst in mouth  . Wisdom tooth extraction    . Colonoscopy    . Robotic assisted total hysterectomy with salpingectomy Bilateral 08/21/2015    Procedure: ROBOTIC ASSISTED TOTAL HYSTERECTOMY ;  Surgeon: BNunzio Cobbs MD;  [facility];  Service: Gynecology;  Laterality: Bilateral;  . Cystoscopy N/A 08/21/2015    Procedure: CYSTOSCOPY;  Surgeon: BNunzio Cobbs MD;  [facility];  Service: Gynecology;  Laterality: N/A;  . Bilateral salpingectomy Bilateral 08/21/2015    Procedure: BILATERAL SALPINGECTOMY;  Surgeon: BNunzio Cobbs MD;  [facility];  Service: Gynecology;  Laterality: Bilateral;  . Abdominal hysterectomy  08/21/15    Current Outpatient Prescriptions  Medication Sig Dispense Refill  . B-12, METHYLCOBALAMIN, SL Place under the tongue.    .Marland Kitchen  Digestive Enzyme CAPS Take 1 capsule by mouth 3 (three) times daily with meals.    . Iron Combinations (IRON COMPLEX PO) Take 87 mg by mouth daily.    . mometasone-formoterol (DULERA) 100-5 MCG/ACT AERO Inhale 2 puffs into the lungs 2 (two) times daily. (Patient taking differently: Inhale 2 puffs into the lungs 2 (two) times daily as needed. ) 1 Inhaler 0  . PROAIR HFA 108 (90 BASE) MCG/ACT inhaler Inhale 2 puffs into the lungs every 4 (four) hours as needed.    . Mesalamine (ASACOL HD) 800 MG TBEC Take 1,600 mg by mouth 2 (two) times daily.    . sertraline (ZOLOFT) 50 MG tablet Take 1 tablet by mouth daily. Reported on 10/02/2015      No current facility-administered medications for this visit.     ALLERGIES: Gluten meal; Codeine; and Peanut-containing drug products  Family History  Problem Relation Age of Onset  . Hypertension Mother   . Diabetes Mother     ? father  . Cancer Paternal Grandmother     ? type  . Diabetes Father   . Cancer Father     Colon Cancer    Social History   Social History  . Marital Status: Married    Spouse Name: N/A  . Number of Children: N/A  . Years of Education: N/A   Occupational History  . Not on file.   Social History Main Topics  . Smoking status: Never Smoker   . Smokeless tobacco: Never Used  . Alcohol Use: 1.2 oz/week    2 Standard drinks or equivalent per week  . Drug Use: No  . Sexual Activity:    Partners: Male     Comment: Hyst   Other Topics Concern  . 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 [name]. [name].    Got B.S. 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  PHYSICAL EXAMINATION:    BP 118/80 mmHg  Pulse 76  Ht 5' 5"  (1.651 m)  Wt 94 lb 12.8 oz (43.001 kg)  BMI 15.78 kg/m2  LMP 08/07/2015    General appearance: alert, cooperative and appears stated age Abdomen: incisions intact and Dermabond still present - removed by me with alcohol swabs.  Abdomen is soft, non-tender; no masses, no organomegaly   Pelvic: External genitalia:  no lesions              Urethra:  normal appearing urethra with no masses, tenderness or lesions              Bartholins and Skenes: normal                 Vagina: normal appearing vagina with normal color and discharge, no lesions. Suture line intact and suture still present.               Cervix: absent                Bimanual Exam:  Uterus:  uterus absent               Adnexa: no mass, fullness, tenderness  Chaperone was present for exam.  ASSESSMENT  Status post robotic TLH, bilateral salpingectomy, cystoscopy.  Depression episode.   PLAN  Counseled regarding recovery status following hysterectomy and need to avoid intercourse and heavy physical activity for 2 more weeks.  Follow up with counselor and PCP for treatment of depression.  Follow up for yearly exams and as needed.  Patient thanked me for her care and for my bedside manner.    An After Visit Summary was printed and given to the patient.

## 2015-10-09 ENCOUNTER — Ambulatory Visit (INDEPENDENT_AMBULATORY_CARE_PROVIDER_SITE_OTHER): Payer: Federal, State, Local not specified - PPO | Admitting: Psychology

## 2015-10-09 DIAGNOSIS — F4323 Adjustment disorder with mixed anxiety and depressed mood: Secondary | ICD-10-CM

## 2015-10-18 ENCOUNTER — Ambulatory Visit (INDEPENDENT_AMBULATORY_CARE_PROVIDER_SITE_OTHER): Payer: Federal, State, Local not specified - PPO | Admitting: Psychology

## 2015-10-18 DIAGNOSIS — F4323 Adjustment disorder with mixed anxiety and depressed mood: Secondary | ICD-10-CM

## 2015-11-01 ENCOUNTER — Ambulatory Visit (INDEPENDENT_AMBULATORY_CARE_PROVIDER_SITE_OTHER): Payer: Federal, State, Local not specified - PPO | Admitting: Psychology

## 2015-11-01 DIAGNOSIS — F4323 Adjustment disorder with mixed anxiety and depressed mood: Secondary | ICD-10-CM

## 2015-11-13 ENCOUNTER — Encounter: Payer: Self-pay | Admitting: Nurse Practitioner

## 2015-11-13 ENCOUNTER — Ambulatory Visit (INDEPENDENT_AMBULATORY_CARE_PROVIDER_SITE_OTHER): Payer: Federal, State, Local not specified - PPO | Admitting: Nurse Practitioner

## 2015-11-13 VITALS — BP 116/80 | HR 64 | Temp 98.2°F | Ht 65.0 in | Wt 92.0 lb

## 2015-11-13 DIAGNOSIS — N760 Acute vaginitis: Secondary | ICD-10-CM | POA: Diagnosis not present

## 2015-11-13 MED ORDER — NYSTATIN-TRIAMCINOLONE 100000-0.1 UNIT/GM-% EX OINT: 1.0000 "application " | TOPICAL_OINTMENT | Freq: Two times a day (BID) | CUTANEOUS | Status: DC

## 2015-11-13 MED ORDER — FLUCONAZOLE 150 MG PO TABS: 150.0000 mg | ORAL_TABLET | Freq: Once | ORAL | Status: DC

## 2015-11-13 NOTE — Progress Notes (Signed)
45 y.o. Married [place] American female [phone] here with complaint of vaginal symptoms of itching, burning, and increase discharge. Describes discharge as white and thick.  She has used homeopathic suppository.  Itching and irritation primarily has been the issue.  No recent antibiotics but she did have more sugar in diet last week.  No pain or problems with SA since hysterectomy in 11-16.   Onset of symptoms 3 days ago. Denies new personal products or vaginal dryness.    No STD concerns. Urinary symptoms none. Contraception is post hysterectomy.  She is going out of town for 2-3 days to complete an audit, would like to start on med's soon.   O:  Healthy female WDWN Affect: normal, orientation x 3  Exam: no distress Abdomen: soft and non tender Lymph node: no enlargement or tenderness Pelvic exam: External genital: normal female with swelling and redness of the labia consistent with yeast BUS: negative Vagina: white thick discharge noted.  Affirm taken. Cervix: absent Uterus: absent Adnexa:normal, non tender, no masses or fullness noted    A: Vaginitis - most consistent with yeast   P: Discussed findings of vaginitis and etiology. Discussed Aveeno or baking soda sitz bath for comfort. Avoid moist clothes or pads for extended period of time. If working out in gym clothes or swim suits for long periods of time change underwear or bottoms of swimsuit if possible. Olive Oil/Coconut Oil use for skin protection prior to activity can be used to external skin.  Rx: will give her Diflucan X 2 since she is going out of town  Start on Triamcinolone and Nystatin to use externally  Follow with Affirm  RV prn

## 2015-11-13 NOTE — Patient Instructions (Signed)

## 2015-11-14 ENCOUNTER — Other Ambulatory Visit: Payer: Self-pay | Admitting: Nurse Practitioner

## 2015-11-14 LAB — WET PREP BY MOLECULAR PROBE
CANDIDA SPECIES: POSITIVE — AB
Gardnerella vaginalis: POSITIVE — AB
Trichomonas vaginosis: NEGATIVE

## 2015-11-14 MED ORDER — METRONIDAZOLE 0.75 % VA GEL: 1.0000 | Freq: Every day | VAGINAL | Status: DC

## 2015-11-14 MED ORDER — FLUCONAZOLE 150 MG PO TABS: 150.0000 mg | ORAL_TABLET | Freq: Once | ORAL | Status: DC

## 2015-11-14 NOTE — Progress Notes (Signed)
Encounter reviewed by Dr. Brook Amundson C. Silva.  

## 2015-11-20 ENCOUNTER — Ambulatory Visit (INDEPENDENT_AMBULATORY_CARE_PROVIDER_SITE_OTHER): Payer: Federal, State, Local not specified - PPO | Admitting: Psychology

## 2015-11-20 DIAGNOSIS — F4323 Adjustment disorder with mixed anxiety and depressed mood: Secondary | ICD-10-CM

## 2015-11-27 ENCOUNTER — Encounter: Payer: Self-pay | Admitting: Family

## 2015-11-27 ENCOUNTER — Ambulatory Visit (INDEPENDENT_AMBULATORY_CARE_PROVIDER_SITE_OTHER): Payer: Federal, State, Local not specified - PPO | Admitting: Family

## 2015-11-27 ENCOUNTER — Ambulatory Visit (HOSPITAL_BASED_OUTPATIENT_CLINIC_OR_DEPARTMENT_OTHER): Payer: Federal, State, Local not specified - PPO | Admitting: Family

## 2015-11-27 ENCOUNTER — Other Ambulatory Visit (HOSPITAL_BASED_OUTPATIENT_CLINIC_OR_DEPARTMENT_OTHER): Payer: Federal, State, Local not specified - PPO

## 2015-11-27 VITALS — BP 103/69 | HR 92 | Temp 97.8°F | Resp 18 | Ht 65.0 in | Wt 91.0 lb

## 2015-11-27 VITALS — BP 110/79 | HR 98 | Temp 98.2°F | Resp 18 | Ht 65.0 in | Wt 90.8 lb

## 2015-11-27 DIAGNOSIS — D509 Iron deficiency anemia, unspecified: Secondary | ICD-10-CM

## 2015-11-27 DIAGNOSIS — R531 Weakness: Secondary | ICD-10-CM

## 2015-11-27 DIAGNOSIS — R5383 Other fatigue: Secondary | ICD-10-CM | POA: Diagnosis not present

## 2015-11-27 DIAGNOSIS — K625 Hemorrhage of anus and rectum: Secondary | ICD-10-CM | POA: Diagnosis not present

## 2015-11-27 DIAGNOSIS — D649 Anemia, unspecified: Secondary | ICD-10-CM

## 2015-11-27 DIAGNOSIS — M2550 Pain in unspecified joint: Secondary | ICD-10-CM | POA: Diagnosis not present

## 2015-11-27 DIAGNOSIS — K529 Noninfective gastroenteritis and colitis, unspecified: Secondary | ICD-10-CM

## 2015-11-27 LAB — CBC WITH DIFFERENTIAL/PLATELET
BASOS ABS: 0 10*3/uL (ref 0.0–0.1)
BASOS PCT: 0.3 % (ref 0.0–3.0)
EOS ABS: 0.1 10*3/uL (ref 0.0–0.7)
Eosinophils Relative: 0.8 % (ref 0.0–5.0)
HCT: 39.6 % (ref 36.0–46.0)
Hemoglobin: 13.1 g/dL (ref 12.0–15.0)
LYMPHS ABS: 1.7 10*3/uL (ref 0.7–4.0)
LYMPHS PCT: 16.1 % (ref 12.0–46.0)
MCHC: 33.2 g/dL (ref 30.0–36.0)
MCV: 76.5 fl — ABNORMAL LOW (ref 78.0–100.0)
MONO ABS: 0.5 10*3/uL (ref 0.1–1.0)
Monocytes Relative: 4.7 % (ref 3.0–12.0)
NEUTROS PCT: 78.1 % — AB (ref 43.0–77.0)
Neutro Abs: 8.1 10*3/uL — ABNORMAL HIGH (ref 1.4–7.7)
Platelets: 457 10*3/uL — ABNORMAL HIGH (ref 150.0–400.0)
RBC: 5.18 Mil/uL — ABNORMAL HIGH (ref 3.87–5.11)
RDW: 16.4 % — AB (ref 11.5–15.5)
WBC: 10.4 10*3/uL (ref 4.0–10.5)

## 2015-11-27 LAB — RHEUMATOID FACTOR: RHEUMATOID FACTOR: 10 [IU]/mL (ref <=14)

## 2015-11-27 LAB — SEDIMENTATION RATE: SED RATE: 85 mm/h — AB (ref 0–22)

## 2015-11-27 LAB — TSH: TSH: 1.55 u[IU]/mL (ref 0.35–4.50)

## 2015-11-27 MED ORDER — CYCLOBENZAPRINE HCL 5 MG PO TABS: 5.0000 mg | ORAL_TABLET | Freq: Every evening | ORAL | Status: DC | PRN

## 2015-11-27 NOTE — Progress Notes (Signed)
Hematology and Oncology Follow Up Visit  KEYMANI GLYNN [id number] 01-29-1971 45 y.o. 11/27/2015   Principle Diagnosis:  1. Elevated serum globulin level 2. Iron deficiency anemia  Current Therapy:   Currently on oral iron but stopping for IV iron as indicated     Interim History:  Ms. Markel is here today for a follow-up. She had a partial hysterectomy in November due to heavy bleeding. Her Iron studies were low when she saw [place] in November but she stayed on an oral iron supplement.  She had some bright red rectal bleeding last week and was seen by her PCP. She had been taking a good bit of Aleve for the joint pain. She states that she has a history of colitis and ulcers throughout her large intestine (thought to possibly be from frequent NSAID usage). She is having her GI records from [facility] sent to [facility] for a new referral.  She is symptomatic at this time with fatigue, weakness and joint aches/pain. She is not sleeping well due to this. She is currently taking Tylenol and Flexeril for her joint pain. This helps some but she is still moving quite slow and unable to be very active.  She has history of being HLA B27 positive and spondyloarthritis. Her sed rate is elevated at 85. Her M-spike in November was not detected. Level today is pending.  No fever, chills, n/v, cough, rash, dizziness, SOB, chest pain, abdominal pain or changes in bowel or bladder habits.  No swelling, numbness or tingling in her extremities.  Her appetite comes and goes. She is taking a protein supplement along with staying on her gluten free diet. She denies feeling dehydrated and states she is drinking enough fluids. Her weight is down 2 lbs since her last visit. We discussed her eating habits and she will try eating 3 healthy meals a day with a protein snack in between.     Medications:    Medication List       This list is accurate as of: 11/27/15  3:44 PM.  Always use your most recent med list.               ASACOL HD 800 MG Tbec  Generic drug:  Mesalamine  Take 1,600 mg by mouth 2 (two) times daily.     B-12 (METHYLCOBALAMIN) SL  Place under the tongue.     cyclobenzaprine 5 MG tablet  Commonly known as:  FLEXERIL  Take 1 tablet (5 mg total) by mouth at bedtime as needed for muscle spasms.     mometasone-formoterol 100-5 MCG/ACT Aero  Commonly known as:  DULERA  Inhale 2 puffs into the lungs 2 (two) times daily.     PROAIR HFA 108 (90 Base) MCG/ACT inhaler  Generic drug:  albuterol  Inhale 2 puffs into the lungs every 4 (four) hours as needed.     PROBIOTIC PO  Take by mouth.        Allergies:  Allergies  Allergen Reactions  . Gluten Meal Other (See Comments)    Auto immune  . Codeine Nausea And Vomiting  . Peanut-Containing Drug Products Other (See Comments)    By allergy test...has eaten peanuts without issues     Past Medical History, Surgical history, Social history, and Family History were reviewed and updated.  Review of Systems: All other 10 point review of systems is negative.   Physical Exam:  vitals were not taken for this visit.  Wt Readings from Last 3 Encounters:  11/27/15 90 lb 12.8 oz (41.187 kg)  11/13/15 92 lb (41.731 kg)  10/02/15 94 lb 12.8 oz (43.001 kg)    Ocular: Sclerae unicteric, pupils equal, round and reactive to light Ear-nose-throat: Oropharynx clear, dentition fair Lymphatic: No cervical supraclavicular or axillary adenopathy Lungs no rales or rhonchi, good excursion bilaterally Heart regular rate and rhythm, no murmur appreciated Abd soft, nontender, positive bowel sounds, no liver or spleen tip palpated on exam  MSK no focal spinal tenderness, no joint edema Neuro: non-focal, well-oriented, appropriate affect Breasts: Deferred  Lab Results  Component Value Date   WBC 10.4 11/27/2015   HGB 13.1 11/27/2015   HCT 39.6 11/27/2015   MCV 76.5* 11/27/2015   PLT 457.0* 11/27/2015   Lab Results  Component Value Date    FERRITIN 16 07/31/2015   IRON 39* 07/31/2015   TIBC 354 07/31/2015   UIBC 315 07/31/2015   IRONPCTSAT 11* 07/31/2015   Lab Results  Component Value Date   RBC 5.18* 11/27/2015   Lab Results  Component Value Date   KPAFRELGTCHN 2.13* 07/31/2015   LAMBDASER 1.58 07/31/2015   KAPLAMBRATIO 1.35 07/31/2015   Lab Results  Component Value Date   IGGSERUM 1470 07/31/2015   IGA 359 07/31/2015   IGMSERUM 181 07/31/2015   Lab Results  Component Value Date   TOTALPROTELP 6.8 07/31/2015   ALBUMINELP 3.1* 07/31/2015   A1GS 0.4* 07/31/2015   A2GS 0.8 07/31/2015   BETS 0.5 07/31/2015   BETA2SER 0.5 07/31/2015   GAMS 1.5 07/31/2015   SPEI * 07/31/2015     Chemistry      Component Value Date/Time   NA 139 09/15/2015 1438   K 4.4 09/15/2015 1438   CL 104 09/15/2015 1438   CO2 23 09/15/2015 1438   BUN 10 09/15/2015 1438   CREATININE 0.60 09/15/2015 1438   CREATININE 0.62 09/08/2015 0812      Component Value Date/Time   CALCIUM 9.4 09/15/2015 1438   ALKPHOS 68 09/08/2015 0812   AST 14 09/08/2015 0812   ALT 10 09/08/2015 0812   BILITOT 0.4 09/08/2015 0812     Impression and Plan: Ms. Boulanger is avery pleasant 45 yo African American female with history of iron deficiency anemia. She had a hysterectomy in November due to heavy bleeding. She is now symptomatic with fatigue, weakness and joint aches/pain.  Her Hgb is stable at 13.1, platelets are elevated at 457. We will see what her iron studies show but I am quite sure she will need an iron infusion.  We will go ahead and set her up for feraheme tomorrow and possibly again in 8 days. She can stop the oral iron at this time.  She is being referred to [place] for her rectal bleeding and has stopped taking NSAIDS for now.  We will see her back in 6 weeks for labs and follow-up.   [name], NP 3/6/20173:44 PM

## 2015-11-27 NOTE — Progress Notes (Signed)
Subjective:    Patient ID: Janice Zuniga, female    DOB: 06-16-1971, 45 y.o.   MRN: [id number]  HPI  Janice Zuniga is a 45 yr old female who presents today with several concerns:  1) Neck Pain- pt reports stiffness in neck, shoulders and upper back. She reports that she began Aleve due to pain and stiffness but then discontinued due to abdominal pain. Neck pain then worsened on Friday. Pain is worse with sleeping. Notes generalized weakness.  2) Rectal Bleeding- pt reports 3 episodes of bloody BM's which began on Friday and continued into Saturday AM. No BM since Saturday.  Reports that she restarted mesalamine on Friday. She has history of colitis.  She reports that she had ulcers which were mild throughout her large intestine. She had colo performed at [facility]. Biopsy is reviewed and was inconclusive. She does admit to frequent NSAID use. Reports mild abdominal pain this AM.   Review of Systems    see HPI  Past Medical History  Diagnosis Date  . Chicken pox   . UTI (lower urinary tract infection)   . Low serum vitamin D   . Non-celiac gluten sensitivity   . Abnormal uterine bleeding   . Anemia   . Genital warts   . History of chlamydia   . HSV-1 (herpes simplex virus 1) infection   . Colitis   . Reactive airway disease   . Low calcium levels   . Dysrhythmia     occassional fluttering noted by patient  . Spondyloarthritis     genetic marker  . Stomach ulcer   . Low iron   . History of hysterectomy 08/21/15  . Muscle spasms of both lower extremities     Spasms in lower back    Social History   Social History  . Marital Status: Married    Spouse Name: N/A  . Number of Children: N/A  . Years of Education: N/A   Occupational History  . Not on file.   Social History Main Topics  . Smoking status: Never Smoker   . Smokeless tobacco: Never Used  . Alcohol Use: 1.2 oz/week    2 Standard drinks or equivalent per week  . Drug Use: No  . Sexual Activity:   Partners: Male     Comment: Hyst   Other Topics Concern  . 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 [name]. [name].    Got 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 Mouth surgery      cyst in mouth  . Wisdom tooth extraction    . Colonoscopy    . Robotic assisted total hysterectomy with salpingectomy Bilateral 08/21/2015    Procedure: ROBOTIC ASSISTED TOTAL HYSTERECTOMY ;  Surgeon: Nunzio Cobbs, MD;  Location: [place] [facility];  Service: Gynecology;  Laterality: Bilateral;  . Cystoscopy N/A 08/21/2015    Procedure: CYSTOSCOPY;  Surgeon: Nunzio Cobbs, MD;  Location: [place] [facility];  Service: Gynecology;  Laterality: N/A;  . Bilateral salpingectomy Bilateral 08/21/2015    Procedure: BILATERAL SALPINGECTOMY;  Surgeon: Nunzio Cobbs, MD;  Location: [place] [facility];  Service: Gynecology;  Laterality:  Bilateral;  . Abdominal hysterectomy  08/21/15    Family History  Problem Relation Age of Onset  . Hypertension Mother   . Diabetes Mother     ? father  . Cancer Paternal Grandmother     ? type  . Diabetes Father   . Cancer Father     Colon Cancer    Allergies  Allergen Reactions  . Gluten Meal Other (See Comments)    Auto immune  . Codeine Nausea And Vomiting  . Peanut-Containing Drug Products Other (See Comments)    By allergy test...has eaten peanuts without issues     Current Outpatient Prescriptions on File Prior to Visit  Medication Sig Dispense Refill  . B-12, METHYLCOBALAMIN, SL Place under the tongue.    . Iron Combinations (IRON COMPLEX PO) Take 87 mg by mouth as needed.     . Mesalamine (ASACOL HD) 800 MG TBEC Take 1,600 mg by mouth 2 (two) times daily.    . mometasone-formoterol (DULERA)  100-5 MCG/ACT AERO Inhale 2 puffs into the lungs 2 (two) times daily. 1 Inhaler 0  . PROAIR HFA 108 (90 BASE) MCG/ACT inhaler Inhale 2 puffs into the lungs every 4 (four) hours as needed.    . Digestive Enzyme CAPS Take 1 capsule by mouth 3 (three) times daily with meals. Reported on 11/27/2015    . metroNIDAZOLE (METROGEL) 0.75 % vaginal gel Place 1 Applicatorful vaginally at bedtime. (Patient not taking: Reported on 11/27/2015) 70 g 0  . nystatin-triamcinolone ointment (MYCOLOG) Apply 1 application topically 2 (two) times daily. (Patient not taking: Reported on 11/27/2015) 30 g 0   No current facility-administered medications on file prior to visit.    BP 110/79 mmHg  Pulse 98  Temp(Src) 98.2 F (36.8 C) (Oral)  Resp 18  Ht 5' 5"  (1.651 m)  Wt 90 lb 12.8 oz (41.187 kg)  BMI 15.11 kg/m2  SpO2 99%  LMP 08/07/2015    Objective:   Physical Exam  Constitutional: She is oriented to person, place, and time.  Cachectic appearing AA female  HENT:  Head: Normocephalic and atraumatic.  Cardiovascular: Normal rate, regular rhythm and normal heart sounds.   No murmur heard. Pulmonary/Chest: Effort normal and breath sounds normal. No respiratory distress. She has no wheezes.  Musculoskeletal: She exhibits no edema.  Very stiff appearing with ambulation  Neurological: She is alert and oriented to person, place, and time.  Skin: Skin is warm and dry.  Psychiatric: She has a normal mood and affect. Her behavior is normal. Judgment and thought content normal.          Assessment & Plan:   Colitis/Rectal bleeding-  Follow up CBC is normal, no anemia.  Advised pt to continue asacol.  She would like referral to a new GI specialist.  Previously followed at [facility].  Will arrange. She is advised that if multiple bloody stools occur she is to proceed directly to the ED.  Lab Results  Component Value Date   WBC 10.4 11/27/2015   HGB 13.1 11/27/2015   HCT 39.6 11/27/2015   MCV 76.5* 11/27/2015    PLT 457.0* 11/27/2015   Joint pain/Stiffness-  ANA and RA are negative but ESR is very elevated. This could be due to colitis, but with level of joint stiffness I am also concerned about the possibility of an autoimmune joint process. Will refer to rheumatology for further evaluation.  Advised pt:   You may begin tylenol 1025m every 8 hours as needed for pain. You  may use flexeril as needed for muscle tightness.

## 2015-11-27 NOTE — Patient Instructions (Addendum)
Go to the ER if you develop recurrent multiple bloody stools. Continue mesalamine. We will work on getting you in to see GI. Please complete lab work prior to leaving. You may begin tylenol 1050m every 8 hours as needed for pain. You may use flexeril as needed for muscle tightness.

## 2015-11-27 NOTE — Progress Notes (Signed)
   Subjective:    Patient ID: Janice Zuniga, female    DOB: 1970-09-27, 45 y.o.   MRN: [id number]  HPI    Review of Systems     Objective:   Physical Exam        Assessment & Plan:

## 2015-11-27 NOTE — Progress Notes (Signed)
Pre visit review using our clinic review tool, if applicable. No additional management support is needed unless otherwise documented below in the visit note. 

## 2015-11-28 ENCOUNTER — Other Ambulatory Visit: Payer: Self-pay | Admitting: Family

## 2015-11-28 ENCOUNTER — Ambulatory Visit (HOSPITAL_BASED_OUTPATIENT_CLINIC_OR_DEPARTMENT_OTHER): Payer: Federal, State, Local not specified - PPO

## 2015-11-28 VITALS — BP 107/72 | HR 82 | Temp 98.2°F | Resp 18

## 2015-11-28 DIAGNOSIS — D509 Iron deficiency anemia, unspecified: Secondary | ICD-10-CM

## 2015-11-28 LAB — PROTEIN ELECTROPHORESIS, SERUM, WITH REFLEX
A/G Ratio: 0.7 (ref 0.7–1.7)
ALBUMIN: 3 g/dL (ref 2.9–4.4)
ALPHA 1: 0.4 g/dL (ref 0.0–0.4)
ALPHA 2: 0.9 g/dL (ref 0.4–1.0)
BETA: 1.3 g/dL (ref 0.7–1.3)
Gamma Globulin: 1.8 g/dL (ref 0.4–1.8)
Globulin, Total: 4.3 g/dL — ABNORMAL HIGH (ref 2.2–3.9)
Total Protein: 7.3 g/dL (ref 6.0–8.5)

## 2015-11-28 LAB — URINALYSIS, MICROSCOPIC (CHCC SATELLITE)
BACTERIA UA: NEGATIVE
Bilirubin (Urine): NEGATIVE
Blood: NEGATIVE
GLUCOSE UR: NEGATIVE mg/dL
KETONES: NEGATIVE mg/dL
LEUKOCYTE ESTERASE: NEGATIVE
Nitrite: NEGATIVE
PH: 6 (ref 4.60–8.00)
PROTEIN: 30 mg/dL
SPECIFIC GRAVITY, URINE: 1.005 (ref 1.003–1.035)
UROBILINOGEN UR: 0.2 mg/dL (ref 0.2–1)
WBC: NEGATIVE (ref 0–2)

## 2015-11-28 LAB — KAPPA/LAMBDA LIGHT CHAINS
Ig Kappa Free Light Chain: 30.08 mg/L — ABNORMAL HIGH (ref 3.30–19.40)
Ig Lambda Free Light Chain: 20.78 mg/L (ref 5.71–26.30)
KAPPA/LAMBDA FLC RATIO: 1.45 (ref 0.26–1.65)

## 2015-11-28 LAB — ANA: ANA: NEGATIVE

## 2015-11-28 LAB — FERRITIN: FERRITIN: 120 ng/mL (ref 9–269)

## 2015-11-28 LAB — IGG, IGA, IGM
IGA/IMMUNOGLOBULIN A, SERUM: 514 mg/dL — AB (ref 87–352)
IgM, Qn, Serum: 236 mg/dL — ABNORMAL HIGH (ref 26–217)

## 2015-11-28 LAB — IRON AND TIBC
%SAT: 5 % — ABNORMAL LOW (ref 21–57)
IRON: 13 ug/dL — AB (ref 41–142)
TIBC: 244 ug/dL (ref 236–444)
UIBC: 231 ug/dL (ref 120–384)

## 2015-11-28 MED ORDER — FAMOTIDINE IN NACL 20-0.9 MG/50ML-% IV SOLN
20.0000 mg | Freq: Once | INTRAVENOUS | Status: AC
  Administered 2015-11-28: 20 mg via INTRAVENOUS

## 2015-11-28 MED ORDER — SODIUM CHLORIDE 0.9 % IV SOLN
510.0000 mg | Freq: Once | INTRAVENOUS | Status: AC
  Administered 2015-11-28: 510 mg via INTRAVENOUS
  Filled 2015-11-28: qty 17

## 2015-11-28 MED ORDER — DIPHENHYDRAMINE HCL 25 MG PO CAPS
25.0000 mg | ORAL_CAPSULE | Freq: Once | ORAL | Status: AC
  Administered 2015-11-28: 25 mg via ORAL

## 2015-11-28 MED ORDER — DIPHENHYDRAMINE HCL 25 MG PO CAPS
ORAL_CAPSULE | ORAL | Status: AC
  Filled 2015-11-28: qty 1

## 2015-11-28 MED ORDER — SODIUM CHLORIDE 0.9 % IV SOLN
Freq: Once | INTRAVENOUS | Status: AC
  Administered 2015-11-28: 09:00:00 via INTRAVENOUS

## 2015-11-28 MED ORDER — METHYLPREDNISOLONE SODIUM SUCC 125 MG IJ SOLR
INTRAMUSCULAR | Status: AC
  Filled 2015-11-28: qty 2

## 2015-11-28 MED ORDER — FAMOTIDINE IN NACL 20-0.9 MG/50ML-% IV SOLN
INTRAVENOUS | Status: AC
  Filled 2015-11-28: qty 50

## 2015-11-28 MED ORDER — METHYLPREDNISOLONE SODIUM SUCC 125 MG IJ SOLR
60.0000 mg | Freq: Once | INTRAMUSCULAR | Status: AC
  Administered 2015-11-28: 60 mg via INTRAVENOUS

## 2015-11-28 NOTE — Progress Notes (Signed)
0910- Patient complained of chest fluttering; states feels like her heart is racing; Feraheme stopped; NS infusing.  0915- Patient states feeling better. No complaints of chest fluttering. Laverna Peace NP aware and pre meds ordered. 1000-Feraheme restarted. Patient tolerated well.

## 2015-11-28 NOTE — Patient Instructions (Signed)

## 2015-11-29 ENCOUNTER — Encounter: Payer: Self-pay | Admitting: Family

## 2015-11-29 MED ORDER — ACETAMINOPHEN-CODEINE #3 300-30 MG PO TABS: 1.0000 | ORAL_TABLET | Freq: Four times a day (QID) | ORAL | Status: DC | PRN

## 2015-11-29 MED ORDER — PREDNISONE 10 MG PO TABS: 40.0000 mg | ORAL_TABLET | Freq: Every day | ORAL | Status: DC

## 2015-11-30 NOTE — Telephone Encounter (Signed)
Rx faxed to [facility].

## 2015-12-04 ENCOUNTER — Encounter: Payer: Federal, State, Local not specified - PPO | Admitting: Family

## 2015-12-11 ENCOUNTER — Ambulatory Visit (HOSPITAL_BASED_OUTPATIENT_CLINIC_OR_DEPARTMENT_OTHER): Payer: Federal, State, Local not specified - PPO

## 2015-12-11 ENCOUNTER — Other Ambulatory Visit: Payer: Self-pay | Admitting: Family

## 2015-12-11 VITALS — BP 115/69 | HR 66 | Temp 98.2°F | Resp 18

## 2015-12-11 DIAGNOSIS — D509 Iron deficiency anemia, unspecified: Secondary | ICD-10-CM

## 2015-12-11 MED ORDER — DIPHENHYDRAMINE HCL 25 MG PO CAPS
ORAL_CAPSULE | ORAL | Status: AC
  Filled 2015-12-11: qty 1

## 2015-12-11 MED ORDER — METHYLPREDNISOLONE SODIUM SUCC 125 MG IJ SOLR
INTRAMUSCULAR | Status: AC
  Filled 2015-12-11: qty 2

## 2015-12-11 MED ORDER — SODIUM CHLORIDE 0.9 % IV SOLN
Freq: Once | INTRAVENOUS | Status: AC
  Administered 2015-12-11: 15:00:00 via INTRAVENOUS

## 2015-12-11 MED ORDER — METHYLPREDNISOLONE SODIUM SUCC 125 MG IJ SOLR
60.0000 mg | Freq: Once | INTRAMUSCULAR | Status: AC
  Administered 2015-12-11: 60 mg via INTRAVENOUS

## 2015-12-11 MED ORDER — FERUMOXYTOL INJECTION 510 MG/17 ML
510.0000 mg | Freq: Once | INTRAVENOUS | Status: AC
  Administered 2015-12-11: 510 mg via INTRAVENOUS
  Filled 2015-12-11: qty 17

## 2015-12-11 MED ORDER — FAMOTIDINE IN NACL 20-0.9 MG/50ML-% IV SOLN
20.0000 mg | Freq: Once | INTRAVENOUS | Status: AC
Start: 2015-12-11 — End: 2015-12-11
  Administered 2015-12-11: 20 mg via INTRAVENOUS

## 2015-12-11 MED ORDER — DIPHENHYDRAMINE HCL 25 MG PO CAPS
25.0000 mg | ORAL_CAPSULE | Freq: Once | ORAL | Status: AC
  Administered 2015-12-11: 25 mg via ORAL

## 2015-12-11 MED ORDER — FAMOTIDINE IN NACL 20-0.9 MG/50ML-% IV SOLN
INTRAVENOUS | Status: AC
  Filled 2015-12-11: qty 50

## 2015-12-11 NOTE — Patient Instructions (Signed)

## 2015-12-18 ENCOUNTER — Encounter: Payer: Self-pay | Admitting: Family

## 2015-12-18 ENCOUNTER — Ambulatory Visit (INDEPENDENT_AMBULATORY_CARE_PROVIDER_SITE_OTHER): Payer: Federal, State, Local not specified - PPO | Admitting: Psychology

## 2015-12-18 ENCOUNTER — Ambulatory Visit (INDEPENDENT_AMBULATORY_CARE_PROVIDER_SITE_OTHER): Payer: Federal, State, Local not specified - PPO | Admitting: Family

## 2015-12-18 VITALS — BP 114/70 | HR 72 | Temp 98.0°F | Resp 16 | Ht 64.0 in | Wt 101.6 lb

## 2015-12-18 DIAGNOSIS — Z0000 Encounter for general adult medical examination without abnormal findings: Secondary | ICD-10-CM | POA: Diagnosis not present

## 2015-12-18 DIAGNOSIS — K529 Noninfective gastroenteritis and colitis, unspecified: Secondary | ICD-10-CM

## 2015-12-18 DIAGNOSIS — F4323 Adjustment disorder with mixed anxiety and depressed mood: Secondary | ICD-10-CM | POA: Diagnosis not present

## 2015-12-18 NOTE — Progress Notes (Addendum)
Subjective:    Patient ID: Janice Zuniga, female    DOB: 04/29/1971, 45 y.o.   MRN: [id number]  HPI   Patient presents today for complete physical.  Immunizations: tetanus is up to date Diet: healthy Wt Readings from Last 3 Encounters:  12/18/15 101 lb 9.6 oz (46.085 kg)  11/27/15 91 lb (41.277 kg)  11/27/15 90 lb 12.8 oz (41.187 kg)  Exercise:  some Pap Smear: hysterectomy Mammogram: 7/16 Eye exam:  11/16  Colitis-  Pt has been taking prednisone and reports significant improvement in her symptoms with oral prednisone.  Her joint pain/stiffness has also improved.    Review of Systems  Constitutional: Negative for unexpected weight change.  HENT: Negative for hearing loss and rhinorrhea.   Eyes: Negative for visual disturbance.  Respiratory: Negative for cough.   Cardiovascular:       Mild pedal edema  Gastrointestinal: Negative for diarrhea, constipation and anal bleeding.  Genitourinary: Negative for dysuria and frequency.  Musculoskeletal: Negative for myalgias and arthralgias.  Skin: Negative for rash.  Neurological: Negative for headaches.  Hematological: Negative for adenopathy.  Psychiatric/Behavioral:       Denies depression/anxiety   Past Medical History  Diagnosis Date  . Chicken pox   . UTI (lower urinary tract infection)   . Low serum vitamin D   . Non-celiac gluten sensitivity   . Abnormal uterine bleeding   . Anemia   . Genital warts   . History of chlamydia   . HSV-1 (herpes simplex virus 1) infection   . Colitis   . Reactive airway disease   . Low calcium levels   . Dysrhythmia     occassional fluttering noted by patient  . Spondyloarthritis     genetic marker  . Stomach ulcer   . Low iron   . History of hysterectomy 08/21/15  . Muscle spasms of both lower extremities     Spasms in lower back    Social History   Social History  . Marital Status: Married    Spouse Name: N/A  . Number of Children: N/A  . Years of Education: N/A    Occupational History  . Not on file.   Social History Main Topics  . Smoking status: Never Smoker   . Smokeless tobacco: Never Used  . Alcohol Use: 1.2 oz/week    2 Standard drinks or equivalent per week  . Drug Use: No  . Sexual Activity:    Partners: Male     Comment: Hyst   Other Topics Concern  . 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 [name]. [name].    Got 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 Mouth surgery      cyst in mouth  . Wisdom tooth extraction    . Colonoscopy    . Robotic assisted total hysterectomy with salpingectomy Bilateral 08/21/2015    Procedure: ROBOTIC ASSISTED TOTAL HYSTERECTOMY ;  Surgeon: Nunzio Cobbs, MD;  Location: [place] [facility];  Service: Gynecology;  Laterality: Bilateral;  . Cystoscopy N/A 08/21/2015    Procedure: CYSTOSCOPY;  Surgeon: Nunzio Cobbs, MD;  Location: [place] [facility];  Service: Gynecology;  Laterality: N/A;  . Bilateral salpingectomy Bilateral 08/21/2015    Procedure: BILATERAL SALPINGECTOMY;  Surgeon: Nunzio Cobbs, MD;  Location: [facility];  Service: Gynecology;  Laterality: Bilateral;  . Abdominal hysterectomy  08/21/15    Family History  Problem Relation Age of Onset  . Hypertension Mother   . Diabetes Mother     ? father  . Cancer Paternal Grandmother     ? type  . Diabetes Father   . Cancer Father     Colon Cancer    Allergies  Allergen Reactions  . Gluten Meal Other (See Comments)    Auto immune  . Codeine Nausea And Vomiting  . Peanut-Containing Drug Products Other (See Comments)    By allergy test...has eaten peanuts without issues     Current Outpatient Prescriptions on File Prior to Visit  Medication Sig Dispense  Refill  . Mesalamine (ASACOL HD) 800 MG TBEC Take 1,600 mg by mouth 2 (two) times daily.    . mometasone-formoterol (DULERA) 100-5 MCG/ACT AERO Inhale 2 puffs into the lungs 2 (two) times daily. 1 Inhaler 0  . predniSONE (DELTASONE) 10 MG tablet Take 4 tablets (40 mg total) by mouth daily with breakfast. 280 tablet 0  . PROAIR HFA 108 (90 BASE) MCG/ACT inhaler Inhale 2 puffs into the lungs every 4 (four) hours as needed.    . B-12, METHYLCOBALAMIN, SL Place under the tongue. Reported on 12/18/2015     No current facility-administered medications on file prior to visit.    BP 114/70 mmHg  Pulse 72  Temp(Src) 98 F (36.7 C) (Oral)  Resp 16  Ht 5' 4"  (1.626 m)  Wt 101 lb 9.6 oz (46.085 kg)  BMI 17.43 kg/m2  SpO2 99%  LMP 08/07/2015       Objective:   Physical Exam  Constitutional: She is oriented to person, place, and time. She appears well-developed and well-nourished.  HENT:  Head: Normocephalic and atraumatic.  Eyes: No scleral icterus.  Cardiovascular: Normal rate, regular rhythm and normal heart sounds.   No murmur heard. Pulmonary/Chest: Effort normal and breath sounds normal. No respiratory distress. She has no wheezes.  Abdominal: Soft. Bowel sounds are normal.  Musculoskeletal: She exhibits no edema.  Lymphadenopathy:    She has no cervical adenopathy.  Neurological: She is alert and oriented to person, place, and time.  Reflex Scores:      Patellar reflexes are 2+ on the right side and 2+ on the left side. Skin: Skin is warm and dry. No rash noted.  Psychiatric: She has a normal mood and affect. Her behavior is normal. Judgment and thought content normal.          Assessment & Plan:  Colitis- clinically resolved. Will taper off of prednisone as outlined in AVS.  She is advised to call if recurrent symptoms occur.  Will check status of rheumatology and GI referrals.    Preventative care- continue healthy diet, discussed exercise.  Her weight has come up  nicely. We discussed an ideal body weight for her around 115 lbs.  EKG tracing is personally reviewed.  EKG notes NSR.  Mildly short PR- no other EKG available for comparison. Asymptomatic. No acute changes. Monitor.

## 2015-12-18 NOTE — Progress Notes (Signed)
Pre visit review using our clinic review tool, if applicable. No additional management support is needed unless otherwise documented below in the visit note. 

## 2015-12-18 NOTE — Addendum Note (Signed)
Addended by: [name] on: 12/18/2015 06:54 PM   Modules accepted: Miquel Dunn

## 2015-12-18 NOTE — Patient Instructions (Addendum)
Please cut your prednisone down to 30 mg daily for 2 days, then 2.5 tabs daily for 2 days then 2 tabs daily x 2 days, then 1.5 tabs daily for 2 days then 1 tab daily for 2 days, then 1/2 tab daily for 2 days then stop.   We will check on your referral to GI and endocrinology.   Follow up in 1 month.

## 2015-12-19 ENCOUNTER — Other Ambulatory Visit: Payer: Self-pay | Admitting: Family

## 2015-12-19 DIAGNOSIS — Z1231 Encounter for screening mammogram for malignant neoplasm of breast: Secondary | ICD-10-CM

## 2015-12-25 MED ORDER — PREDNISONE 10 MG PO TABS: ORAL_TABLET | ORAL | Status: AC

## 2015-12-25 NOTE — Addendum Note (Signed)
Addended by: [name] on: 12/25/2015 08:44 AM   Modules accepted: Orders, SmartSet

## 2015-12-29 ENCOUNTER — Encounter: Payer: Self-pay | Admitting: Family

## 2015-12-29 ENCOUNTER — Other Ambulatory Visit (HOSPITAL_BASED_OUTPATIENT_CLINIC_OR_DEPARTMENT_OTHER): Payer: Federal, State, Local not specified - PPO

## 2015-12-29 ENCOUNTER — Ambulatory Visit (HOSPITAL_BASED_OUTPATIENT_CLINIC_OR_DEPARTMENT_OTHER): Payer: Federal, State, Local not specified - PPO | Admitting: Family

## 2015-12-29 VITALS — BP 120/71 | HR 79 | Temp 97.6°F | Resp 16 | Ht 64.0 in | Wt 102.0 lb

## 2015-12-29 DIAGNOSIS — D509 Iron deficiency anemia, unspecified: Secondary | ICD-10-CM | POA: Diagnosis not present

## 2015-12-29 DIAGNOSIS — R771 Abnormality of globulin: Secondary | ICD-10-CM | POA: Diagnosis not present

## 2015-12-29 LAB — CBC WITH DIFFERENTIAL (CANCER CENTER ONLY)
BASO#: 0 10*3/uL (ref 0.0–0.2)
BASO%: 0.1 % (ref 0.0–2.0)
EOS ABS: 0 10*3/uL (ref 0.0–0.5)
EOS%: 0.1 % (ref 0.0–7.0)
HCT: 42.2 % (ref 34.8–46.6)
HEMOGLOBIN: 14.4 g/dL (ref 11.6–15.9)
LYMPH#: 1.1 10*3/uL (ref 0.9–3.3)
LYMPH%: 7.4 % — AB (ref 14.0–48.0)
MCH: 27.9 pg (ref 26.0–34.0)
MCHC: 34.1 g/dL (ref 32.0–36.0)
MCV: 82 fL (ref 81–101)
MONO#: 0.2 10*3/uL (ref 0.1–0.9)
MONO%: 1.3 % (ref 0.0–13.0)
NEUT#: 13.8 10*3/uL — ABNORMAL HIGH (ref 1.5–6.5)
NEUT%: 91.1 % — ABNORMAL HIGH (ref 39.6–80.0)
PLATELETS: 202 10*3/uL (ref 145–400)
RBC: 5.17 10*6/uL (ref 3.70–5.32)
RDW: 23.8 % — ABNORMAL HIGH (ref 11.1–15.7)
WBC: 15.2 10*3/uL — AB (ref 3.9–10.0)

## 2015-12-29 NOTE — Progress Notes (Signed)
Hematology and Oncology Follow Up Visit  Janice Zuniga [id number] Aug 05, 1971 45 y.o. 12/29/2015   Principle Diagnosis:  1. Elevated serum globulin level 2. Iron deficiency anemia 3. HLA B27 positive and spondyloarthritis   Current Therapy:   IV iron as indicated     Interim History:  Janice Zuniga is here today for a follow-up. Janice Zuniga is feeling much better. Her energy has improved since Janice Zuniga received the 2 iron infusions in March. Her iron saturation at that time was 5% with a ferritin of 120. Her Hgb today is now up to 14.4 with an MCV of 82.  M-spike was not detected in March.  No fever, chills, n/v, cough, rash, dizziness, SOB, chest pain, palpitations, abdominal pain or changes in bowel or bladder habits.  No swelling, numbness or tingling in her extremities. Janice Zuniga is still having some mild joint aches and pains. This improved with Prednisone.  Her appetite has improved and Janice Zuniga is staying well hydrated. Her weight is up 12 lbs since her last visit.      Medications:    Medication List       This list is accurate as of: 12/29/15  3:12 PM.  Always use your most recent med list.               ASACOL HD 800 MG Tbec  Generic drug:  Mesalamine  Take 1,600 mg by mouth 2 (two) times daily.     B-12 (METHYLCOBALAMIN) SL  Place under the tongue. Reported on 12/18/2015     cholecalciferol 1000 units tablet  Commonly known as:  VITAMIN D  Take 1,000 Units by mouth 2 (two) times daily.     mometasone-formoterol 100-5 MCG/ACT Aero  Commonly known as:  DULERA  Inhale 2 puffs into the lungs 2 (two) times daily.     predniSONE 10 MG tablet  Commonly known as:  DELTASONE  Taper as directed.     PROAIR HFA 108 (90 Base) MCG/ACT inhaler  Generic drug:  albuterol  Inhale 2 puffs into the lungs every 4 (four) hours as needed.        Allergies:  Allergies  Allergen Reactions  . Gluten Meal Other (See Comments)    Auto immune  . Codeine Nausea And Vomiting  . Peanut-Containing Drug  Products Other (See Comments)    By allergy test...has eaten peanuts without issues     Past Medical History, Surgical history, Social history, and Family History were reviewed and updated.  Review of Systems: All other 10 point review of systems is negative.   Physical Exam:  height is 5' 4"  (1.626 m) and weight is 102 lb (46.267 kg). Her oral temperature is 97.6 F (36.4 C). Her blood pressure is 120/71 and her pulse is 79. Her respiration is 16.   Wt Readings from Last 3 Encounters:  12/29/15 102 lb (46.267 kg)  12/18/15 101 lb 9.6 oz (46.085 kg)  11/27/15 91 lb (41.277 kg)    Ocular: Sclerae unicteric, pupils equal, round and reactive to light Ear-nose-throat: Oropharynx clear, dentition fair Lymphatic: No cervical supraclavicular or axillary adenopathy Lungs no rales or rhonchi, good excursion bilaterally Heart regular rate and rhythm, no murmur appreciated Abd soft, nontender, positive bowel sounds, no liver or spleen tip palpated on exam, no fluid wave  MSK no focal spinal tenderness, no joint edema Neuro: non-focal, well-oriented, appropriate affect Breasts: Deferred  Lab Results  Component Value Date   WBC 15.2* 12/29/2015   HGB 14.4 12/29/2015   HCT  42.2 12/29/2015   MCV 82 12/29/2015   PLT 202 12/29/2015   Lab Results  Component Value Date   FERRITIN 120 11/27/2015   IRON 13* 11/27/2015   TIBC 244 11/27/2015   UIBC 231 11/27/2015   IRONPCTSAT 5* 11/27/2015   Lab Results  Component Value Date   RBC 5.17 12/29/2015   Lab Results  Component Value Date   KPAFRELGTCHN 2.13* 07/31/2015   LAMBDASER 1.58 07/31/2015   KAPLAMBRATIO 1.45 11/27/2015   Lab Results  Component Value Date   IGGSERUM 1,731* 11/27/2015   IGA 359 07/31/2015   IGMSERUM 236* 11/27/2015   Lab Results  Component Value Date   TOTALPROTELP 6.8 07/31/2015   ALBUMINELP 3.1* 07/31/2015   A1GS 0.4* 07/31/2015   A2GS 0.8 07/31/2015   BETS 0.5 07/31/2015   BETA2SER 0.5 07/31/2015    GAMS 1.5 07/31/2015   MSPIKE Not Observed 11/27/2015   SPEI * 07/31/2015     Chemistry      Component Value Date/Time   NA 139 09/15/2015 1438   K 4.4 09/15/2015 1438   CL 104 09/15/2015 1438   CO2 23 09/15/2015 1438   BUN 10 09/15/2015 1438   CREATININE 0.60 09/15/2015 1438   CREATININE 0.62 09/08/2015 0812      Component Value Date/Time   CALCIUM 9.4 09/15/2015 1438   ALKPHOS 68 09/08/2015 0812   AST 14 09/08/2015 0812   ALT 10 09/08/2015 0812   BILITOT 0.4 09/08/2015 0812     Impression and Plan: Janice Zuniga is avery pleasant 45 yo African American female with history of iron deficiency anemia. Janice Zuniga had a hysterectomy in November due to heavy bleeding. Janice Zuniga is feeling much better since receiving 2 doses of iron in March. Her symptoms have almost completely resolved. Janice Zuniga is still having some mild joint aches and pains.  Janice Zuniga is currently on a prednisone taper and her WBC count is 15.2.  We will see what her iron studies show and bring her in next week for an infusion if needed.  We will see her back in 3 months for labs and follow-up.  Janice Zuniga will contact us with any questions or concerns. We can certainly see her sooner if need be.   [name], NP 4/7/20173:12 PM

## 2015-12-30 LAB — RETICULOCYTES: Reticulocyte Count: 2.1 % (ref 0.6–2.6)

## 2016-01-01 ENCOUNTER — Other Ambulatory Visit: Payer: Self-pay

## 2016-01-01 ENCOUNTER — Ambulatory Visit: Payer: Self-pay | Admitting: Family

## 2016-01-01 LAB — IRON AND TIBC
%SAT: 51 % (ref 21–57)
Iron: 139 ug/dL (ref 41–142)
TIBC: 273 ug/dL (ref 236–444)
UIBC: 134 ug/dL (ref 120–384)

## 2016-01-01 LAB — FERRITIN: Ferritin: 443 ng/ml — ABNORMAL HIGH (ref 9–269)

## 2016-01-12 ENCOUNTER — Ambulatory Visit (INDEPENDENT_AMBULATORY_CARE_PROVIDER_SITE_OTHER): Payer: Federal, State, Local not specified - PPO | Admitting: Obstetrics and Gynecology

## 2016-01-12 ENCOUNTER — Encounter: Payer: Self-pay | Admitting: Obstetrics and Gynecology

## 2016-01-12 ENCOUNTER — Other Ambulatory Visit: Payer: Self-pay | Admitting: Obstetrics and Gynecology

## 2016-01-12 VITALS — BP 102/62 | HR 80 | Ht 65.0 in | Wt 103.6 lb

## 2016-01-12 DIAGNOSIS — N644 Mastodynia: Secondary | ICD-10-CM | POA: Diagnosis not present

## 2016-01-12 NOTE — Progress Notes (Signed)
Patient ID: Janice Zuniga, female   DOB: 1971/02/21, 45 y.o.   MRN: [id number] GYNECOLOGY  VISIT   HPI: 45 y.o.   Married  [place] American  female   [phone] with Patient's last menstrual period was 08/07/2015.   here for right breast pain for one week.  No definite mass or redness.  Pain starts in the right shoulder and also along the lateral right breast.  No mass felt.   Struggling with inflammation.  Has seen PCP and is waiting to see PCP. Just finished course of prednisone.   Had recently done yoga and not sure if strained a muscle.  Had an energy boost after taking the prednisone.  Iron is till low and has done 2 iron infusions.  Levels are now normal.   GYNECOLOGIC HISTORY: Patient's last menstrual period was 08/07/2015. Contraception:  Hysterectomy Menopausal hormone therapy:  none Last mammogram: 03-30-15 Bil.Diag.and US/density cat.C/benign [facility]breast cysts[facility]  Last pap smear:  03-31-15 Neg:Neg HR HPV         OB History    Gravida Para Term Preterm AB TAB SAB Ectopic Multiple Living   5 3 3       3          Patient Active Problem List   Diagnosis Date Noted  . Elevated serum globulin level 12/29/2015  . Depression 09/15/2015  . Vitamin D deficiency 09/08/2015  . Anemia 09/08/2015  . Status post laparoscopic hysterectomy 08/21/2015  . Low calcium levels 07/14/2015  . 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 Dyspnea 07/11/2014  . Iron deficiency anemia 07/11/2014  . HLA B27 (HLA B27 positive) 07/06/2014  . Low serum vitamin D   . Non-celiac gluten sensitivity   . Spondyloarthritis     Past Medical History  Diagnosis Date  . Chicken pox   . UTI (lower urinary tract infection)   . Low serum vitamin D   . Non-celiac gluten sensitivity   . Abnormal uterine bleeding   . Anemia   . Genital warts   . History of chlamydia   . HSV-1 (herpes simplex virus 1) infection   . Colitis   . Reactive airway disease   . Low  calcium levels   . Dysrhythmia     occassional fluttering noted by patient  . Spondyloarthritis     genetic marker  . Stomach ulcer   . Low iron   . History of hysterectomy 08/21/15  . Muscle spasms of both lower extremities     Spasms in lower back    Past Surgical History  Procedure Laterality Date  . Mouth surgery      cyst in mouth  . Wisdom tooth extraction    . Colonoscopy    . Robotic assisted total hysterectomy with salpingectomy Bilateral 08/21/2015    Procedure: ROBOTIC ASSISTED TOTAL HYSTERECTOMY ;  Surgeon: BNunzio Cobbs MD;  [facility];  Service: Gynecology;  Laterality: Bilateral;  . Cystoscopy N/A 08/21/2015    Procedure: CYSTOSCOPY;  Surgeon: BNunzio Cobbs MD;  [facility];  Service: Gynecology;  Laterality: N/A;  . Bilateral salpingectomy Bilateral 08/21/2015    Procedure: BILATERAL SALPINGECTOMY;  Surgeon: BNunzio Cobbs MD;  [facility];  Service: Gynecology;  Laterality: Bilateral;  . Abdominal hysterectomy  08/21/15    Current Outpatient Prescriptions  Medication Sig Dispense Refill  . B-12, METHYLCOBALAMIN, SL Place under the tongue. Reported on 12/18/2015    . cholecalciferol (VITAMIN D)  1000 units tablet Take 1,000 Units by mouth 2 (two) times daily.    . Mesalamine (ASACOL HD) 800 MG TBEC Take 1,600 mg by mouth 2 (two) times daily.    . mometasone-formoterol (DULERA) 100-5 MCG/ACT AERO Inhale 2 puffs into the lungs 2 (two) times daily. 1 Inhaler 0  . PROAIR HFA 108 (90 BASE) MCG/ACT inhaler Inhale 2 puffs into the lungs every 4 (four) hours as needed.     No current facility-administered medications for this visit.     ALLERGIES: Gluten meal; Codeine; and Peanut-containing drug products  Family History  Problem Relation Age of Onset  . Hypertension Mother   . Diabetes Mother     ? father  . Cancer Paternal Grandmother     ? type  . Diabetes Father   . Cancer Father     Colon Cancer    Social  History   Social History  . Marital Status: Married    Spouse Name: N/A  . Number of Children: N/A  . Years of Education: N/A   Occupational History  . Not on file.   Social History Main Topics  . Smoking status: Never Smoker   . Smokeless tobacco: Never Used  . Alcohol Use: 1.2 oz/week    2 Standard drinks or equivalent per week  . Drug Use: No  . Sexual Activity:    Partners: Male     Comment: Hyst   Other Topics Concern  . 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 [name]. [name].    Got B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  PHYSICAL EXAMINATION:    BP 102/62 mmHg  Pulse 80  Ht 5' 5"  (1.651 m)  Wt 103 lb 9.6 oz (46.993 kg)  BMI 17.24 kg/m2  LMP 08/07/2015    General appearance: alert, cooperative and appears stated age   Breasts: normal appearance, no masses or tenderness, Inspection negative, No nipple retraction or dimpling, No nipple discharge or bleeding, No axillary or supraclavicular adenopathy.  Tenderness to palpation of the right lateral rib cage.    Chaperone was present for exam.  ASSESSMENT  Right breast pain.  Right chest wall pain.  Normal breast exam.   PLAN  Discussion of right breast pain/chest wall pain.  Dx right mammogram and possible right breast ultrasound.  Rest, NSAIDs.    An After Visit Summary was printed and given to the patient.  __15____ minutes face to face time of which over 50% was spent in counseling.

## 2016-01-12 NOTE — Patient Instructions (Signed)
Rest and take some anti inflammatory medication for the pain!

## 2016-01-12 NOTE — Progress Notes (Signed)
Patient is scheduled for R Breast Diagnostic Mammogram and R Breast Ultrasound at [facility] on 01/17/16 at 1310 . Patient agreeable to time/date/location.

## 2016-01-17 ENCOUNTER — Ambulatory Visit
Admission: RE | Admit: 2016-01-17 | Discharge: 2016-01-17 | Disposition: A | Discharge status: Home / Self Care | Payer: Federal, State, Local not specified - PPO | Source: Ambulatory Visit | Attending: Obstetrics and Gynecology | Admitting: Obstetrics and Gynecology

## 2016-01-17 DIAGNOSIS — N644 Mastodynia: Secondary | ICD-10-CM

## 2016-01-29 ENCOUNTER — Ambulatory Visit (INDEPENDENT_AMBULATORY_CARE_PROVIDER_SITE_OTHER): Payer: Federal, State, Local not specified - PPO | Admitting: Psychology

## 2016-01-29 DIAGNOSIS — F4323 Adjustment disorder with mixed anxiety and depressed mood: Secondary | ICD-10-CM

## 2016-02-14 ENCOUNTER — Other Ambulatory Visit: Payer: Self-pay | Admitting: Family

## 2016-02-14 ENCOUNTER — Encounter: Payer: Self-pay | Admitting: Nurse Practitioner

## 2016-02-14 ENCOUNTER — Other Ambulatory Visit (HOSPITAL_BASED_OUTPATIENT_CLINIC_OR_DEPARTMENT_OTHER): Payer: Federal, State, Local not specified - PPO | Admitting: Nurse Practitioner

## 2016-02-14 DIAGNOSIS — R771 Abnormality of globulin: Secondary | ICD-10-CM

## 2016-02-14 DIAGNOSIS — D509 Iron deficiency anemia, unspecified: Secondary | ICD-10-CM | POA: Diagnosis not present

## 2016-02-14 LAB — CBC WITH DIFFERENTIAL (CANCER CENTER ONLY)
BASO#: 0 10*3/uL (ref 0.0–0.2)
BASO%: 0.4 % (ref 0.0–2.0)
EOS ABS: 0.1 10*3/uL (ref 0.0–0.5)
EOS%: 1.2 % (ref 0.0–7.0)
HCT: 38.2 % (ref 34.8–46.6)
HGB: 13.3 g/dL (ref 11.6–15.9)
LYMPH#: 1.9 10*3/uL (ref 0.9–3.3)
LYMPH%: 23.3 % (ref 14.0–48.0)
MCH: 28.4 pg (ref 26.0–34.0)
MCHC: 34.8 g/dL (ref 32.0–36.0)
MCV: 82 fL (ref 81–101)
MONO#: 0.6 10*3/uL (ref 0.1–0.9)
MONO%: 7.4 % (ref 0.0–13.0)
NEUT%: 67.7 % (ref 39.6–80.0)
NEUTROS ABS: 5.6 10*3/uL (ref 1.5–6.5)
PLATELETS: 335 10*3/uL (ref 145–400)
RBC: 4.68 10*6/uL (ref 3.70–5.32)
RDW: 15.7 % (ref 11.1–15.7)
WBC: 8.3 10*3/uL (ref 3.9–10.0)

## 2016-02-15 ENCOUNTER — Other Ambulatory Visit: Payer: Self-pay | Admitting: *Deleted

## 2016-02-15 ENCOUNTER — Ambulatory Visit (HOSPITAL_BASED_OUTPATIENT_CLINIC_OR_DEPARTMENT_OTHER): Payer: Federal, State, Local not specified - PPO

## 2016-02-15 VITALS — BP 115/70 | HR 83 | Temp 98.0°F | Resp 20

## 2016-02-15 DIAGNOSIS — D509 Iron deficiency anemia, unspecified: Secondary | ICD-10-CM | POA: Diagnosis not present

## 2016-02-15 LAB — IRON AND TIBC
%SAT: 11 % — AB (ref 21–57)
IRON: 22 ug/dL — AB (ref 41–142)
TIBC: 196 ug/dL — ABNORMAL LOW (ref 236–444)
UIBC: 175 ug/dL (ref 120–384)

## 2016-02-15 LAB — FERRITIN: FERRITIN: 335 ng/mL — AB (ref 9–269)

## 2016-02-15 MED ORDER — DIPHENHYDRAMINE HCL 25 MG PO CAPS
25.0000 mg | ORAL_CAPSULE | Freq: Once | ORAL | Status: AC
  Administered 2016-02-15: 25 mg via ORAL

## 2016-02-15 MED ORDER — FAMOTIDINE IN NACL 20-0.9 MG/50ML-% IV SOLN
20.0000 mg | Freq: Once | INTRAVENOUS | Status: AC
  Administered 2016-02-15: 20 mg via INTRAVENOUS

## 2016-02-15 MED ORDER — FAMOTIDINE IN NACL 20-0.9 MG/50ML-% IV SOLN
INTRAVENOUS | Status: AC
  Filled 2016-02-15: qty 50

## 2016-02-15 MED ORDER — METHYLPREDNISOLONE SODIUM SUCC 125 MG IJ SOLR
125.0000 mg | Freq: Once | INTRAMUSCULAR | Status: AC
  Administered 2016-02-15: 125 mg via INTRAVENOUS

## 2016-02-15 MED ORDER — METHYLPREDNISOLONE SODIUM SUCC 125 MG IJ SOLR
INTRAMUSCULAR | Status: AC
  Filled 2016-02-15: qty 2

## 2016-02-15 MED ORDER — SODIUM CHLORIDE 0.9 % IV SOLN
Freq: Once | INTRAVENOUS | Status: AC
  Administered 2016-02-15: 13:00:00 via INTRAVENOUS

## 2016-02-15 MED ORDER — DIPHENHYDRAMINE HCL 25 MG PO CAPS
ORAL_CAPSULE | ORAL | Status: AC
  Filled 2016-02-15: qty 1

## 2016-02-15 MED ORDER — SODIUM CHLORIDE 0.9 % IV SOLN
510.0000 mg | Freq: Once | INTRAVENOUS | Status: AC
  Administered 2016-02-15: 510 mg via INTRAVENOUS
  Filled 2016-02-15: qty 17

## 2016-02-15 NOTE — Patient Instructions (Signed)

## 2016-02-16 ENCOUNTER — Telehealth: Payer: Self-pay | Admitting: Gastroenterology

## 2016-02-23 ENCOUNTER — Ambulatory Visit (INDEPENDENT_AMBULATORY_CARE_PROVIDER_SITE_OTHER): Payer: Federal, State, Local not specified - PPO | Admitting: Psychology

## 2016-02-23 ENCOUNTER — Other Ambulatory Visit: Payer: Self-pay | Admitting: Family

## 2016-02-23 ENCOUNTER — Ambulatory Visit (HOSPITAL_BASED_OUTPATIENT_CLINIC_OR_DEPARTMENT_OTHER): Payer: Federal, State, Local not specified - PPO

## 2016-02-23 VITALS — BP 111/79 | HR 69 | Temp 98.2°F | Resp 18

## 2016-02-23 DIAGNOSIS — D509 Iron deficiency anemia, unspecified: Secondary | ICD-10-CM

## 2016-02-23 DIAGNOSIS — F4323 Adjustment disorder with mixed anxiety and depressed mood: Secondary | ICD-10-CM | POA: Diagnosis not present

## 2016-02-23 MED ORDER — METHYLPREDNISOLONE SODIUM SUCC 125 MG IJ SOLR
125.0000 mg | Freq: Once | INTRAMUSCULAR | Status: AC
  Administered 2016-02-23: 125 mg via INTRAVENOUS

## 2016-02-23 MED ORDER — DIPHENHYDRAMINE HCL 25 MG PO CAPS
ORAL_CAPSULE | ORAL | Status: AC
  Filled 2016-02-23: qty 1

## 2016-02-23 MED ORDER — SODIUM CHLORIDE 0.9 % IV SOLN
510.0000 mg | Freq: Once | INTRAVENOUS | Status: AC
  Administered 2016-02-23: 510 mg via INTRAVENOUS
  Filled 2016-02-23: qty 17

## 2016-02-23 MED ORDER — METHYLPREDNISOLONE SODIUM SUCC 125 MG IJ SOLR
INTRAMUSCULAR | Status: AC
  Filled 2016-02-23: qty 2

## 2016-02-23 MED ORDER — SODIUM CHLORIDE 0.9 % IV SOLN
Freq: Once | INTRAVENOUS | Status: AC
  Administered 2016-02-23: 16:00:00 via INTRAVENOUS

## 2016-02-23 MED ORDER — FAMOTIDINE IN NACL 20-0.9 MG/50ML-% IV SOLN
20.0000 mg | Freq: Once | INTRAVENOUS | Status: AC
  Administered 2016-02-23: 20 mg via INTRAVENOUS

## 2016-02-23 MED ORDER — FAMOTIDINE IN NACL 20-0.9 MG/50ML-% IV SOLN
INTRAVENOUS | Status: AC
  Filled 2016-02-23: qty 50

## 2016-02-23 MED ORDER — DIPHENHYDRAMINE HCL 25 MG PO CAPS
25.0000 mg | ORAL_CAPSULE | Freq: Once | ORAL | Status: AC
  Administered 2016-02-23: 25 mg via ORAL

## 2016-02-23 NOTE — Patient Instructions (Signed)

## 2016-02-26 ENCOUNTER — Encounter: Payer: Self-pay | Admitting: Gastroenterology

## 2016-02-26 NOTE — Telephone Encounter (Signed)
Dr. Havery Moros has accepted pat. But needs to be scheduled for an office visit first.   VC

## 2016-03-11 ENCOUNTER — Ambulatory Visit (INDEPENDENT_AMBULATORY_CARE_PROVIDER_SITE_OTHER): Payer: Federal, State, Local not specified - PPO | Admitting: Psychology

## 2016-03-11 DIAGNOSIS — F4323 Adjustment disorder with mixed anxiety and depressed mood: Secondary | ICD-10-CM | POA: Diagnosis not present

## 2016-03-29 ENCOUNTER — Encounter: Payer: Self-pay | Admitting: Family

## 2016-03-29 ENCOUNTER — Ambulatory Visit (HOSPITAL_BASED_OUTPATIENT_CLINIC_OR_DEPARTMENT_OTHER)
Admission: RE | Admit: 2016-03-29 | Discharge: 2016-03-29 | Disposition: A | Discharge status: Home / Self Care | Payer: Federal, State, Local not specified - PPO | Source: Ambulatory Visit | Attending: Family | Admitting: Family

## 2016-03-29 ENCOUNTER — Ambulatory Visit (INDEPENDENT_AMBULATORY_CARE_PROVIDER_SITE_OTHER): Payer: Federal, State, Local not specified - PPO | Admitting: Family

## 2016-03-29 ENCOUNTER — Other Ambulatory Visit (HOSPITAL_BASED_OUTPATIENT_CLINIC_OR_DEPARTMENT_OTHER): Payer: Federal, State, Local not specified - PPO

## 2016-03-29 ENCOUNTER — Ambulatory Visit (HOSPITAL_BASED_OUTPATIENT_CLINIC_OR_DEPARTMENT_OTHER): Payer: Federal, State, Local not specified - PPO | Admitting: Family

## 2016-03-29 VITALS — BP 100/80 | HR 90 | Temp 97.8°F | Resp 20 | Ht 64.0 in | Wt 100.8 lb

## 2016-03-29 VITALS — BP 108/71 | HR 83 | Temp 97.9°F | Resp 16 | Ht 64.0 in | Wt 100.0 lb

## 2016-03-29 DIAGNOSIS — D649 Anemia, unspecified: Secondary | ICD-10-CM | POA: Diagnosis not present

## 2016-03-29 DIAGNOSIS — R771 Abnormality of globulin: Secondary | ICD-10-CM

## 2016-03-29 DIAGNOSIS — D509 Iron deficiency anemia, unspecified: Secondary | ICD-10-CM

## 2016-03-29 DIAGNOSIS — Z8719 Personal history of other diseases of the digestive system: Secondary | ICD-10-CM | POA: Diagnosis not present

## 2016-03-29 DIAGNOSIS — R0781 Pleurodynia: Secondary | ICD-10-CM | POA: Diagnosis not present

## 2016-03-29 LAB — CBC WITH DIFFERENTIAL (CANCER CENTER ONLY)
BASO#: 0 10*3/uL (ref 0.0–0.2)
BASO%: 0.2 % (ref 0.0–2.0)
EOS%: 0.8 % (ref 0.0–7.0)
Eosinophils Absolute: 0.1 10*3/uL (ref 0.0–0.5)
HCT: 36.9 % (ref 34.8–46.6)
HEMOGLOBIN: 12.4 g/dL (ref 11.6–15.9)
LYMPH#: 2.1 10*3/uL (ref 0.9–3.3)
LYMPH%: 20.6 % (ref 14.0–48.0)
MCH: 28.6 pg (ref 26.0–34.0)
MCHC: 33.6 g/dL (ref 32.0–36.0)
MCV: 85 fL (ref 81–101)
MONO#: 0.7 10*3/uL (ref 0.1–0.9)
MONO%: 6.5 % (ref 0.0–13.0)
NEUT%: 71.9 % (ref 39.6–80.0)
NEUTROS ABS: 7.1 10*3/uL — AB (ref 1.5–6.5)
PLATELETS: 436 10*3/uL — AB (ref 145–400)
RBC: 4.33 10*6/uL (ref 3.70–5.32)
RDW: 12.8 % (ref 11.1–15.7)
WBC: 9.9 10*3/uL (ref 3.9–10.0)

## 2016-03-29 MED ORDER — TRAMADOL HCL 50 MG PO TABS: 50.0000 mg | ORAL_TABLET | Freq: Four times a day (QID) | ORAL | Status: DC | PRN

## 2016-03-29 NOTE — Progress Notes (Signed)
Subjective:    Patient ID: Janice Zuniga, female    DOB: 1971-07-09, 45 y.o.   MRN: [id number]  HPI  Janice Zuniga is a 45 yr old female who presents today for follow up.  Iron deficiency anemia- she is following with hematology and received her last IV iron infusion on 02/23/16.   R rib pain- Feels "like I was punched in the ribs." Tender to the touch. Pain began on Monday. Denies trauma to the area.  Of note, she had right breast US/Mammo back in April for right breast/chest pain.  Note was made of a 46m cyst over the 9 oclock position of the right breast 3 cm from the nipple. Also note was made of a cluster of simple cyst at 5 oclock right breast 2 cm from nipple.  She is scheduled for her annual screening mammogram on 04/08/16.   Colitis-  She is currently scheduled to see Dr. AHavery Moroson 04/29/16. Denies blood in stool. Denies diarrhea.  She did see Dr. DEstanislado Pandy2 weeks ago (rheumatology).  She reports that she had some mild arthritis in her neck/thoracic/lumbar and was referred to PT which pt feels is helping.    Review of Systems See HPI  Past Medical History  Diagnosis Date  . Chicken pox   . UTI (lower urinary tract infection)   . Low serum vitamin D   . Non-celiac gluten sensitivity   . Abnormal uterine bleeding   . Anemia   . Genital warts   . History of chlamydia   . HSV-1 (herpes simplex virus 1) infection   . Colitis   . Reactive airway disease   . Low calcium levels   . Dysrhythmia     occassional fluttering noted by patient  . Spondyloarthritis     genetic marker  . Stomach ulcer   . Low iron   . History of hysterectomy 08/21/15  . Muscle spasms of both lower extremities     Spasms in lower back     Social History   Social History  . Marital Status: Married    Spouse Name: N/A  . Number of Children: N/A  . Years of Education: N/A   Occupational History  . Not on file.   Social History Main Topics  . Smoking status: Never Smoker   . Smokeless  tobacco: Never Used  . Alcohol Use: 1.2 oz/week    2 Standard drinks or equivalent per week  . Drug Use: No  . Sexual Activity:    Partners: Male     Comment: Hyst   Other Topics Concern  . 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 JLarkin Ina'95. [name].    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 Mouth surgery      cyst in mouth  . Wisdom tooth extraction    . Colonoscopy    . Robotic assisted total hysterectomy with salpingectomy Bilateral 08/21/2015    Procedure: ROBOTIC ASSISTED TOTAL HYSTERECTOMY ;  Surgeon: BNunzio Cobbs MD;  [facility];  Service: Gynecology;  Laterality: Bilateral;  . Cystoscopy N/A 08/21/2015    Procedure: CYSTOSCOPY;  Surgeon: BJamey Reas  Lorenz Coaster, MD;  [facility];  Service: Gynecology;  Laterality: N/A;  . Bilateral salpingectomy Bilateral 08/21/2015    Procedure: BILATERAL SALPINGECTOMY;  Surgeon: Nunzio Cobbs, MD;  Location[facility];  Service: Gynecology;  Laterality: Bilateral;  . Abdominal hysterectomy  08/21/15    Family History  Problem Relation Age of Onset  . Hypertension Mother   . Diabetes Mother     ? father  . Cancer Paternal Grandmother     ? type  . Diabetes Father   . Cancer Father     Colon Cancer    Allergies  Allergen Reactions  . Gluten Meal Other (See Comments)    Auto immune  . Codeine Nausea And Vomiting  . Peanut-Containing Drug Products Other (See Comments)    By allergy test...has eaten peanuts without issues     Current Outpatient Prescriptions on File Prior to Visit  Medication Sig Dispense Refill  . cholecalciferol (VITAMIN D) 1000 units tablet Take 1,000 Units by mouth 2 (two) times daily. Reported on  03/29/2016    . Mesalamine (ASACOL HD) 800 MG TBEC Take 1,600 mg by mouth 2 (two) times daily. Reported on 03/29/2016    . mometasone-formoterol (DULERA) 100-5 MCG/ACT AERO Inhale 2 puffs into the lungs 2 (two) times daily. (Patient not taking: Reported on 03/29/2016) 1 Inhaler 0  . PROAIR HFA 108 (90 BASE) MCG/ACT inhaler Inhale 2 puffs into the lungs every 4 (four) hours as needed. Reported on 03/29/2016     No current facility-administered medications on file prior to visit.    BP 100/80 mmHg  Pulse 90  Temp(Src) 97.8 F (36.6 C) (Oral)  Resp 20  Ht 5' 4"  (1.626 m)  Wt 100 lb 12.8 oz (45.723 kg)  BMI 17.29 kg/m2  SpO2 98%  LMP 08/07/2015       Objective:   Physical Exam  Constitutional: She is oriented to person, place, and time. She appears well-developed and well-nourished.  Cardiovascular: Normal rate, regular rhythm and normal heart sounds.   No murmur heard. Pulmonary/Chest: Effort normal and breath sounds normal. No respiratory distress. She has no wheezes.  Abdominal: Soft. She exhibits no distension. There is no tenderness. There is no rebound and no guarding.  Musculoskeletal: She exhibits no edema.  + point tenderness overlying right lower anterior rib cage. No deformity noted. No swelling  Neurological: She is alert and oriented to person, place, and time.  Psychiatric: She has a normal mood and affect. Her behavior is normal. Judgment and thought content normal.          Assessment & Plan:  R rib pain- suspect musculoskeletal pain- will obtain x ray to rule out rib fracture.  Rx with tramadol short term or severe pain, tylenol prn mild pain.

## 2016-03-29 NOTE — Progress Notes (Signed)
Pre visit review using our clinic review tool, if applicable. No additional management support is needed unless otherwise documented below in the visit note. 

## 2016-03-29 NOTE — Assessment & Plan Note (Signed)
Stable, being managed by hematology. We discussed adding iron rich foods to her diet.

## 2016-03-29 NOTE — Assessment & Plan Note (Signed)
Clinically stable. Advised pt to keep her upcoming GI appointment.

## 2016-03-29 NOTE — Patient Instructions (Addendum)
Please complete your rib x ray on the first floor. We will notify you via Mychart with your results. For severe pain you may use tramadol.  For mild pain, you may use tylenol. Please keep your upcoming appointment with GI.

## 2016-03-29 NOTE — Progress Notes (Signed)
Hematology and Oncology Follow Up Visit  FLORETTA PETRO [id number] 04-21-1971 45 y.o. 03/29/2016   Principle Diagnosis:  1. Elevated serum globulin level 2. Iron deficiency anemia 3. HLA B27 positive and spondyloarthritis   Current Therapy:   IV iron as indicated     Interim History:  Ms. Almario is here today for a follow-up. She continues to do well since receiving Feraheme in late May and early June. Her fatigue is much improved. Her Hgb today is 12.4 with an MCV of 85. Her platelet count is up slightly at 436. We will see what her iron studies show.  She is under quite a bit of stress at work.  She has some pain in her right rib cage. Her chest xray was negative and she has Ultram for pain.  She is receiving PT for neck and lower back pain.  M-spike was not detected in March. Levels today are pending.  No fever, chills, n/v, cough, rash, dizziness, SOB, chest pain, palpitations, abdominal pain or changes in bowel or bladder habits.  No swelling, tenderness, numbness or tingling in her extremities.  She has maintained a good appetite and is staying well hydrated. Her weight is stable.      Medications:    Medication List       This list is accurate as of: 03/29/16  4:11 PM.  Always use your most recent med list.               ASACOL HD 800 MG Tbec  Generic drug:  Mesalamine  Take 1,600 mg by mouth 2 (two) times daily. Reported on 03/29/2016     cholecalciferol 1000 units tablet  Commonly known as:  VITAMIN D  Take 1,000 Units by mouth 2 (two) times daily. Reported on 03/29/2016     mometasone-formoterol 100-5 MCG/ACT Aero  Commonly known as:  DULERA  Inhale 2 puffs into the lungs 2 (two) times daily.     PROAIR HFA 108 (90 Base) MCG/ACT inhaler  Generic drug:  albuterol  Inhale 2 puffs into the lungs every 4 (four) hours as needed. Reported on 03/29/2016     traMADol 50 MG tablet  Commonly known as:  ULTRAM  Take 1 tablet (50 mg total) by mouth every 6 (six) hours as  needed.        Allergies:  Allergies  Allergen Reactions  . Gluten Meal Other (See Comments)    Auto immune  . Codeine Nausea And Vomiting  . Peanut-Containing Drug Products Other (See Comments)    By allergy test...has eaten peanuts without issues     Past Medical History, Surgical history, Social history, and Family History were reviewed and updated.  Review of Systems: All other 10 point review of systems is negative.   Physical Exam:  height is 5' 4"  (1.626 m) and weight is 100 lb (45.36 kg). Her oral temperature is 97.9 F (36.6 C). Her blood pressure is 108/71 and her pulse is 83. Her respiration is 16.   Wt Readings from Last 3 Encounters:  03/29/16 100 lb (45.36 kg)  03/29/16 100 lb 12.8 oz (45.723 kg)  01/12/16 103 lb 9.6 oz (46.993 kg)    Ocular: Sclerae unicteric, pupils equal, round and reactive to light Ear-nose-throat: Oropharynx clear, dentition fair Lymphatic: No cervical supraclavicular or axillary adenopathy Lungs no rales or rhonchi, good excursion bilaterally Heart regular rate and rhythm, no murmur appreciated Abd soft, nontender, positive bowel sounds, no liver or spleen tip palpated on exam, no fluid  wave  MSK no focal spinal tenderness, no joint edema Neuro: non-focal, well-oriented, appropriate affect Breasts: Deferred  Lab Results  Component Value Date   WBC 9.9 03/29/2016   HGB 12.4 03/29/2016   HCT 36.9 03/29/2016   MCV 85 03/29/2016   PLT 436* 03/29/2016   Lab Results  Component Value Date   FERRITIN 335* 02/14/2016   IRON 22* 02/14/2016   TIBC 196* 02/14/2016   UIBC 175 02/14/2016   IRONPCTSAT 11* 02/14/2016   Lab Results  Component Value Date   RBC 4.33 03/29/2016   Lab Results  Component Value Date   KPAFRELGTCHN 2.13* 07/31/2015   LAMBDASER 1.58 07/31/2015   KAPLAMBRATIO 1.45 11/27/2015   Lab Results  Component Value Date   IGGSERUM 1,731* 11/27/2015   IGA 359 07/31/2015   IGMSERUM 236* 11/27/2015   Lab Results    Component Value Date   TOTALPROTELP 6.8 07/31/2015   ALBUMINELP 3.1* 07/31/2015   A1GS 0.4* 07/31/2015   A2GS 0.8 07/31/2015   BETS 0.5 07/31/2015   BETA2SER 0.5 07/31/2015   GAMS 1.5 07/31/2015   MSPIKE Not Observed 11/27/2015   SPEI * 07/31/2015     Chemistry      Component Value Date/Time   NA 139 09/15/2015 1438   K 4.4 09/15/2015 1438   CL 104 09/15/2015 1438   CO2 23 09/15/2015 1438   BUN 10 09/15/2015 1438   CREATININE 0.60 09/15/2015 1438   CREATININE 0.62 09/08/2015 0812      Component Value Date/Time   CALCIUM 9.4 09/15/2015 1438   ALKPHOS 68 09/08/2015 0812   AST 14 09/08/2015 0812   ALT 10 09/08/2015 0812   BILITOT 0.4 09/08/2015 0812     Impression and Plan: Ms. Kulzer is avery pleasant 45 yo African American female with history of iron deficiency anemia. She had a nice response to the iron she received in June. Her fatigue has much improved.  She is still having a good deal of neck and back pain and is currently in PT.  We will see what her iron studies show and plan to bring her in next week for an infusion if needed.  We will see her back in 3 months for labs and follow-up.  She will contact us with any questions or concerns. We can certainly see her sooner if need be.   [name], NP 7/7/20174:11 PM

## 2016-03-30 ENCOUNTER — Encounter: Payer: Self-pay | Admitting: Family

## 2016-03-30 LAB — IGG, IGA, IGM
IgA, Qn, Serum: 409 mg/dL — ABNORMAL HIGH (ref 87–352)
IgG, Qn, Serum: 1530 mg/dL (ref 700–1600)
IgM, Qn, Serum: 230 mg/dL — ABNORMAL HIGH (ref 26–217)

## 2016-03-30 LAB — RETICULOCYTES: Reticulocyte Count: 1.1 % (ref 0.6–2.6)

## 2016-04-01 LAB — FERRITIN: Ferritin: 1096 ng/ml — ABNORMAL HIGH (ref 9–269)

## 2016-04-01 LAB — KAPPA/LAMBDA LIGHT CHAINS
Ig Kappa Free Light Chain: 27.2 mg/L — ABNORMAL HIGH (ref 3.3–19.4)
Ig Lambda Free Light Chain: 19.9 mg/L (ref 5.7–26.3)
Kappa/Lambda FluidC Ratio: 1.37 (ref 0.26–1.65)

## 2016-04-01 LAB — IRON AND TIBC
%SAT: 14 % — AB (ref 21–57)
Iron: 26 ug/dL — ABNORMAL LOW (ref 41–142)
TIBC: 190 ug/dL — AB (ref 236–444)
UIBC: 164 ug/dL (ref 120–384)

## 2016-04-02 ENCOUNTER — Other Ambulatory Visit: Payer: Self-pay | Admitting: *Deleted

## 2016-04-02 DIAGNOSIS — D509 Iron deficiency anemia, unspecified: Secondary | ICD-10-CM

## 2016-04-02 LAB — PROTEIN ELECTROPHORESIS, SERUM, WITH REFLEX
A/G RATIO SPE: 0.8 (ref 0.7–1.7)
Albumin: 3.2 g/dL (ref 2.9–4.4)
Alpha 1: 0.3 g/dL (ref 0.0–0.4)
Alpha 2: 0.8 g/dL (ref 0.4–1.0)
Beta: 1.1 g/dL (ref 0.7–1.3)
GAMMA GLOBULIN: 1.6 g/dL (ref 0.4–1.8)
GLOBULIN, TOTAL: 3.9 g/dL (ref 2.2–3.9)
TOTAL PROTEIN: 7.1 g/dL (ref 6.0–8.5)

## 2016-04-04 ENCOUNTER — Ambulatory Visit: Payer: Federal, State, Local not specified - PPO | Admitting: Obstetrics and Gynecology

## 2016-04-05 ENCOUNTER — Ambulatory Visit (INDEPENDENT_AMBULATORY_CARE_PROVIDER_SITE_OTHER): Payer: Federal, State, Local not specified - PPO | Admitting: Psychology

## 2016-04-05 ENCOUNTER — Ambulatory Visit (HOSPITAL_BASED_OUTPATIENT_CLINIC_OR_DEPARTMENT_OTHER): Payer: Federal, State, Local not specified - PPO

## 2016-04-05 VITALS — BP 104/74 | HR 76 | Temp 98.2°F | Resp 16

## 2016-04-05 DIAGNOSIS — D509 Iron deficiency anemia, unspecified: Secondary | ICD-10-CM

## 2016-04-05 DIAGNOSIS — F4323 Adjustment disorder with mixed anxiety and depressed mood: Secondary | ICD-10-CM

## 2016-04-05 MED ORDER — METHYLPREDNISOLONE SODIUM SUCC 125 MG IJ SOLR
125.0000 mg | Freq: Once | INTRAMUSCULAR | Status: AC
  Administered 2016-04-05: 125 mg via INTRAVENOUS

## 2016-04-05 MED ORDER — FAMOTIDINE IN NACL 20-0.9 MG/50ML-% IV SOLN
20.0000 mg | Freq: Once | INTRAVENOUS | Status: AC
  Administered 2016-04-05: 20 mg via INTRAVENOUS

## 2016-04-05 MED ORDER — DIPHENHYDRAMINE HCL 25 MG PO CAPS
25.0000 mg | ORAL_CAPSULE | Freq: Once | ORAL | Status: AC
  Administered 2016-04-05: 25 mg via ORAL

## 2016-04-05 MED ORDER — DIPHENHYDRAMINE HCL 25 MG PO CAPS
ORAL_CAPSULE | ORAL | Status: AC
  Filled 2016-04-05: qty 1

## 2016-04-05 MED ORDER — SODIUM CHLORIDE 0.9 % IV SOLN
510.0000 mg | Freq: Once | INTRAVENOUS | Status: AC
  Administered 2016-04-05: 510 mg via INTRAVENOUS
  Filled 2016-04-05: qty 17

## 2016-04-05 MED ORDER — SODIUM CHLORIDE 0.9 % IV SOLN
INTRAVENOUS | Status: DC
  Administered 2016-04-05: 15:00:00 via INTRAVENOUS

## 2016-04-05 MED ORDER — METHYLPREDNISOLONE SODIUM SUCC 125 MG IJ SOLR
INTRAMUSCULAR | Status: AC
  Filled 2016-04-05: qty 2

## 2016-04-05 MED ORDER — FAMOTIDINE IN NACL 20-0.9 MG/50ML-% IV SOLN
INTRAVENOUS | Status: AC
  Filled 2016-04-05: qty 50

## 2016-04-05 NOTE — Patient Instructions (Signed)

## 2016-04-08 ENCOUNTER — Ambulatory Visit
Admission: RE | Admit: 2016-04-08 | Discharge: 2016-04-08 | Disposition: A | Discharge status: Home / Self Care | Payer: Federal, State, Local not specified - PPO | Source: Ambulatory Visit | Attending: Family | Admitting: Family

## 2016-04-08 DIAGNOSIS — Z1231 Encounter for screening mammogram for malignant neoplasm of breast: Secondary | ICD-10-CM

## 2016-04-29 ENCOUNTER — Encounter: Payer: Self-pay | Admitting: Gastroenterology

## 2016-04-29 ENCOUNTER — Ambulatory Visit (INDEPENDENT_AMBULATORY_CARE_PROVIDER_SITE_OTHER): Payer: Federal, State, Local not specified - PPO | Admitting: Gastroenterology

## 2016-04-29 ENCOUNTER — Ambulatory Visit: Payer: Self-pay | Admitting: Gastroenterology

## 2016-04-29 VITALS — BP 98/58 | HR 66 | Ht 63.0 in | Wt 96.0 lb

## 2016-04-29 DIAGNOSIS — K529 Noninfective gastroenteritis and colitis, unspecified: Secondary | ICD-10-CM

## 2016-04-29 DIAGNOSIS — E611 Iron deficiency: Secondary | ICD-10-CM | POA: Diagnosis not present

## 2016-04-29 MED ORDER — NA SULFATE-K SULFATE-MG SULF 17.5-3.13-1.6 GM/177ML PO SOLN: ORAL | 0 refills | Status: DC

## 2016-04-29 NOTE — Progress Notes (Signed)
HPI :  45 y/o female referred for iron deficiency anemia with PMH of reported colitis, myalgias / chronic joint pains, and menorrhagia. Also HLA B27 positive. She thinks she has had iron deficiency anemia since college. She reports heavy menses her entire life which led to hysterectomy last year due to fibroids. She has had the hysterectomy in November 2016, she thinks her iron stores have continued to fluctuate. She reports she is seeing hematology and on IV iron. 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   She denies any diarrhea or problems with her bowels at this time. She states last year she was taking a lot of NSAIDs for joint pains, but she thinks she has tried removing lectins from her diet which she thinks has helped her feel better for the past moth. She has been gluten free for 5-6 years at this time but has had negative celiac testing in the past. She has tried to avoid all NSAIDs, and she has been off all pain medications for the past month or so. She reports chronic muscular pains for which she had seen Rheumatology. She is seeing physical therapy.   She has roughly 1 BM per day or so. No blood in the stools. No nausea or vomiting. No abdominal pains. She thinks her weight has fluctuated a bit, perhaps lost a little bit since her new diet. She was seen by [place] in August 2016 - she had bloody stools at one point and abdominal pain, CT scan obtained showing thickening of the right colon and cecum. She had a colonoscopy at [facility] last year. This was done on 06/08/2015 with multiple ulcers noted throughout the colon, worst in the right colon. Normal ileum. Hemorrhoids. Biopsies taken showing active chronic colitis. Patient states she thinks she was told this was due to NSAIDs or Crohns, not sure at the time. She was given mesalamine at the time which she thinks helped but she since stopped it around in January time  frame or so. She also reports being on a course of prednisone in the past for "aches" but does not know the details of this.   She has never had a prior upper endoscopy.   Colonoscopy 2013 reportedly normal  Past Medical History:  Diagnosis Date  . Abnormal CT of the abdomen   . Abnormal uterine bleeding   . Anemia    iron def  . Chicken pox   . Colitis   . Dysrhythmia    occassional fluttering noted by patient  . Genital warts   . Gluten intolerance   . History of chlamydia   . History of hysterectomy 08/21/15  . HLA B27 (HLA B27 positive)   . HSV-1 (herpes simplex virus 1) infection   . Low calcium levels   . Low iron   . Low serum vitamin D   . Muscle spasms of both lower extremities    Spasms in lower back  . Non-celiac gluten sensitivity   . Reactive airway disease   . Spondyloarthritis    genetic marker  . Stomach ulcer   . UTI (lower urinary tract infection)      Past Surgical History:  Procedure Laterality Date  . ABDOMINAL HYSTERECTOMY  08/21/15  . BILATERAL SALPINGECTOMY Bilateral 08/21/2015   Procedure: BILATERAL SALPINGECTOMY;  Surgeon: Nunzio Cobbs, MD;  Location: [facility];  Service: Gynecology;  Laterality: Bilateral;  .  COLONOSCOPY    . CYSTOSCOPY N/A 08/21/2015   Procedure: CYSTOSCOPY;  Surgeon: Nunzio Cobbs, MD;  Location: [facility];  Service: Gynecology;  Laterality: N/A;  . MOUTH SURGERY     cyst in mouth  . ROBOTIC ASSISTED TOTAL HYSTERECTOMY WITH SALPINGECTOMY Bilateral 08/21/2015   Procedure: ROBOTIC ASSISTED TOTAL HYSTERECTOMY ;  Surgeon: Nunzio Cobbs, MD;  Location: [facility];  Service: Gynecology;  Laterality: Bilateral;  . WISDOM TOOTH EXTRACTION     Family History  Problem Relation Age of Onset  . Hypertension Mother   . Diabetes Mother     ? father  . Cancer Paternal Grandmother     ? type  . Diabetes Father   . Cancer Father     Colon Cancer   Social History  Substance Use Topics  . Smoking status:  Never Smoker  . Smokeless tobacco: Never Used  . Alcohol use 1.2 oz/week    2 Standard drinks or equivalent per week   Current Outpatient Prescriptions  Medication Sig Dispense Refill  . cholecalciferol (VITAMIN D) 1000 units tablet Take 1,000 Units by mouth 2 (two) times daily. Reported on 03/29/2016    . Ferrous Sulfate (IRON) 90 (18 Fe) MG TABS Take 2 capsules by mouth 2 (two) times daily.    Marland Kitchen PROAIR HFA 108 (90 BASE) MCG/ACT inhaler Inhale 2 puffs into the lungs every 4 (four) hours as needed. Reported on 03/29/2016    . Na Sulfate-K Sulfate-Mg Sulf 17.5-3.13-1.6 GM/180ML SOLN Suprep-Use as directed 354 mL 0   No current facility-administered medications for this visit.    Allergies  Allergen Reactions  . Gluten Meal Other (See Comments)    Auto immune  . Tramadol     nausea  . Codeine Nausea And Vomiting  . Peanut-Containing Drug Products Other (See Comments)    By allergy test...has eaten peanuts without issues      Review of Systems: All systems reviewed and negative except where noted in HPI.    Mm Screening Breast Tomo Bilateral  Result Date: 04/09/2016 CLINICAL DATA:  Screening. EXAM: 2D DIGITAL SCREENING BILATERAL MAMMOGRAM WITH CAD AND ADJUNCT TOMO COMPARISON:  Previous exam(s). ACR Breast Density Category d: The breast tissue is extremely dense, which lowers the sensitivity of mammography. FINDINGS: There are no findings suspicious for malignancy. Images were processed with CAD. IMPRESSION: No mammographic evidence of malignancy. A result letter of this screening mammogram will be mailed directly to the patient. RECOMMENDATION: Screening mammogram in one year. (Code:SM-B-01Y) BI-RADS CATEGORY  1: Negative. Electronically Signed   By: Fidela Salisbury M.D.   On: 04/09/2016 14:54    Physical Exam: BP (!) 98/58   Pulse 66   Ht 5' 3"  (1.6 m)   Wt 96 lb (43.5 kg)   LMP 08/07/2015   BMI 17.01 kg/m  Constitutional: Pleasant,well-developed, thin appearing female in no  acute distress. HEENT: Normocephalic and atraumatic. Conjunctivae are normal. No scleral icterus. Neck supple.  Cardiovascular: Normal rate, regular rhythm.  Pulmonary/chest: Effort normal and breath sounds normal. No wheezing, rales or rhonchi. Abdominal: Soft, nondistended, nontender. Bowel sounds active throughout. There are no masses palpable. No hepatomegaly. Extremities: no edema Lymphadenopathy: No cervical adenopathy noted. Neurological: Alert and oriented to person place and time. Skin: Skin is warm and dry. No rashes noted. Psychiatric: Normal mood and affect. Behavior is normal.   ASSESSMENT AND PLAN: 45 y/o female who is HLA B27 positive, with chronic iron deficiency anemia. History as above. She endorses  significant myalgias for which she took NSAIDs. She presented last year to [facility] and colonoscopy on NSAIDs showed colitis / ulcerations, biopsies c/w IBD vs. NSAID changes. It was thought her iron deficiency may have been due to menorrhagia, and she underwent hysterectomy. Unfortunately she has continued to require IV iron infusions despite having hysterectomy.   While NSAID enterography could have caused the findings on her last colonoscopy, given her persistent iron deficiency, a repeat colonoscopy is warranted while OFF NSAIDS to ensure no evidence of IBD. If she had IBD it could account for her anemia and arthralgias. She has been on NSAIDs for 4 weeks, recommend she schedule colonoscopy in another month. I also recommend adding EGD to this given her iron deficiency and if she has Crohns, to rule out upper tract involvement.   Discussed risks / benefits of EGD and colonoscopy, she agreed with the plan and wished to proceed. Further recommendations pending the results.    Carver Cellar, MD [facility] Pager [phone]  CC: [name], NP

## 2016-04-29 NOTE — Patient Instructions (Signed)
You have been scheduled for an endoscopy and colonoscopy. Please follow the written instructions given to you at your visit today. Please pick up your prep supplies at the pharmacy within the next 1-3 days. If you use inhalers (even only as needed), please bring them with you on the day of your procedure. Your physician has requested that you go to www.startemmi.com and enter the access code given to you at your visit today. This web site gives a general overview about your procedure. However, you should still follow specific instructions given to you by our office regarding your preparation for the procedure.  If you are age 24 or older, your body mass index should be between 23-30. Your Body mass index is 17.01 kg/m. If this is out of the aforementioned range listed, please consider follow up with your Primary Care Provider.  If you are age 57 or younger, your body mass index should be between 19-25. Your Body mass index is 17.01 kg/m. If this is out of the aformentioned range listed, please consider follow up with your Primary Care Provider.

## 2016-05-01 ENCOUNTER — Ambulatory Visit (INDEPENDENT_AMBULATORY_CARE_PROVIDER_SITE_OTHER): Payer: Federal, State, Local not specified - PPO | Admitting: Psychology

## 2016-05-01 DIAGNOSIS — F4323 Adjustment disorder with mixed anxiety and depressed mood: Secondary | ICD-10-CM | POA: Diagnosis not present

## 2016-05-09 ENCOUNTER — Telehealth: Payer: Self-pay | Admitting: *Deleted

## 2016-05-09 ENCOUNTER — Other Ambulatory Visit (HOSPITAL_BASED_OUTPATIENT_CLINIC_OR_DEPARTMENT_OTHER): Payer: Federal, State, Local not specified - PPO

## 2016-05-09 ENCOUNTER — Other Ambulatory Visit: Payer: Self-pay | Admitting: *Deleted

## 2016-05-09 DIAGNOSIS — D509 Iron deficiency anemia, unspecified: Secondary | ICD-10-CM

## 2016-05-09 LAB — CBC WITH DIFFERENTIAL (CANCER CENTER ONLY)
BASO#: 0 10*3/uL (ref 0.0–0.2)
BASO%: 0.2 % (ref 0.0–2.0)
EOS%: 0.8 % (ref 0.0–7.0)
Eosinophils Absolute: 0.1 10*3/uL (ref 0.0–0.5)
HEMATOCRIT: 38.5 % (ref 34.8–46.6)
HEMOGLOBIN: 13.5 g/dL (ref 11.6–15.9)
LYMPH#: 1.9 10*3/uL (ref 0.9–3.3)
LYMPH%: 17.2 % (ref 14.0–48.0)
MCH: 28.8 pg (ref 26.0–34.0)
MCHC: 35.1 g/dL (ref 32.0–36.0)
MCV: 82 fL (ref 81–101)
MONO#: 0.9 10*3/uL (ref 0.1–0.9)
MONO%: 7.7 % (ref 0.0–13.0)
NEUT%: 74.1 % (ref 39.6–80.0)
NEUTROS ABS: 8.2 10*3/uL — AB (ref 1.5–6.5)
Platelets: 323 10*3/uL (ref 145–400)
RBC: 4.69 10*6/uL (ref 3.70–5.32)
RDW: 14 % (ref 11.1–15.7)
WBC: 11.1 10*3/uL — AB (ref 3.9–10.0)

## 2016-05-09 NOTE — Telephone Encounter (Signed)
Received call from patient stating that she is having symptoms similar to when she is iron deficient.  She is fatigued and weak/tired.  Spoke with Laverna Peace NP and she ordered iron studies to be done.  Appt made for today.

## 2016-05-10 LAB — IRON AND TIBC
%SAT: 18 % — ABNORMAL LOW (ref 21–57)
Iron: 27 ug/dL — ABNORMAL LOW (ref 41–142)
TIBC: 153 ug/dL — AB (ref 236–444)
UIBC: 126 ug/dL (ref 120–384)

## 2016-05-10 LAB — FERRITIN: Ferritin: 1214 ng/ml — ABNORMAL HIGH (ref 9–269)

## 2016-05-14 ENCOUNTER — Ambulatory Visit (HOSPITAL_BASED_OUTPATIENT_CLINIC_OR_DEPARTMENT_OTHER): Payer: Federal, State, Local not specified - PPO

## 2016-05-14 VITALS — BP 96/65 | HR 86 | Temp 98.2°F | Resp 16

## 2016-05-14 DIAGNOSIS — D509 Iron deficiency anemia, unspecified: Secondary | ICD-10-CM

## 2016-05-14 MED ORDER — SODIUM CHLORIDE 0.9 % IV SOLN
510.0000 mg | Freq: Once | INTRAVENOUS | Status: AC
  Administered 2016-05-14: 510 mg via INTRAVENOUS
  Filled 2016-05-14: qty 17

## 2016-05-14 MED ORDER — DIPHENHYDRAMINE HCL 25 MG PO CAPS
ORAL_CAPSULE | ORAL | Status: AC
  Filled 2016-05-14: qty 1

## 2016-05-14 MED ORDER — FAMOTIDINE IN NACL 20-0.9 MG/50ML-% IV SOLN
20.0000 mg | Freq: Once | INTRAVENOUS | Status: AC
  Administered 2016-05-14: 20 mg via INTRAVENOUS

## 2016-05-14 MED ORDER — METHYLPREDNISOLONE SODIUM SUCC 125 MG IJ SOLR
INTRAMUSCULAR | Status: AC
  Filled 2016-05-14: qty 2

## 2016-05-14 MED ORDER — METHYLPREDNISOLONE SODIUM SUCC 125 MG IJ SOLR
125.0000 mg | Freq: Once | INTRAMUSCULAR | Status: AC
  Administered 2016-05-14: 125 mg via INTRAVENOUS

## 2016-05-14 MED ORDER — DIPHENHYDRAMINE HCL 25 MG PO CAPS
25.0000 mg | ORAL_CAPSULE | Freq: Once | ORAL | Status: AC
  Administered 2016-05-14: 25 mg via ORAL

## 2016-05-14 MED ORDER — FAMOTIDINE IN NACL 20-0.9 MG/50ML-% IV SOLN
INTRAVENOUS | Status: AC
  Filled 2016-05-14: qty 50

## 2016-05-14 NOTE — Patient Instructions (Signed)

## 2016-05-21 ENCOUNTER — Ambulatory Visit (HOSPITAL_BASED_OUTPATIENT_CLINIC_OR_DEPARTMENT_OTHER): Payer: Federal, State, Local not specified - PPO

## 2016-05-21 VITALS — BP 100/82 | HR 99 | Temp 98.3°F | Resp 16

## 2016-05-21 DIAGNOSIS — D509 Iron deficiency anemia, unspecified: Secondary | ICD-10-CM | POA: Diagnosis not present

## 2016-05-21 MED ORDER — METHYLPREDNISOLONE SODIUM SUCC 125 MG IJ SOLR
125.0000 mg | Freq: Once | INTRAMUSCULAR | Status: AC
  Administered 2016-05-21: 125 mg via INTRAVENOUS

## 2016-05-21 MED ORDER — DIPHENHYDRAMINE HCL 25 MG PO CAPS
25.0000 mg | ORAL_CAPSULE | Freq: Once | ORAL | Status: AC
  Administered 2016-05-21: 25 mg via ORAL

## 2016-05-21 MED ORDER — METHYLPREDNISOLONE SODIUM SUCC 125 MG IJ SOLR
INTRAMUSCULAR | Status: AC
  Filled 2016-05-21: qty 2

## 2016-05-21 MED ORDER — DIPHENHYDRAMINE HCL 50 MG/ML IJ SOLN
INTRAMUSCULAR | Status: AC
  Filled 2016-05-21: qty 1

## 2016-05-21 MED ORDER — SODIUM CHLORIDE 0.9 % IV SOLN
510.0000 mg | Freq: Once | INTRAVENOUS | Status: AC
  Administered 2016-05-21: 510 mg via INTRAVENOUS
  Filled 2016-05-21: qty 17

## 2016-05-21 MED ORDER — FAMOTIDINE IN NACL 20-0.9 MG/50ML-% IV SOLN
20.0000 mg | Freq: Once | INTRAVENOUS | Status: AC
  Administered 2016-05-21: 20 mg via INTRAVENOUS

## 2016-05-21 MED ORDER — DIPHENHYDRAMINE HCL 25 MG PO CAPS
ORAL_CAPSULE | ORAL | Status: AC
  Filled 2016-05-21: qty 1

## 2016-05-21 MED ORDER — FAMOTIDINE IN NACL 20-0.9 MG/50ML-% IV SOLN
INTRAVENOUS | Status: AC
  Filled 2016-05-21: qty 50

## 2016-05-21 NOTE — Patient Instructions (Signed)

## 2016-05-22 ENCOUNTER — Ambulatory Visit (INDEPENDENT_AMBULATORY_CARE_PROVIDER_SITE_OTHER): Payer: Federal, State, Local not specified - PPO | Admitting: Psychology

## 2016-05-22 DIAGNOSIS — F4323 Adjustment disorder with mixed anxiety and depressed mood: Secondary | ICD-10-CM | POA: Diagnosis not present

## 2016-06-12 ENCOUNTER — Ambulatory Visit (INDEPENDENT_AMBULATORY_CARE_PROVIDER_SITE_OTHER): Payer: Federal, State, Local not specified - PPO | Admitting: Psychology

## 2016-06-12 DIAGNOSIS — F4323 Adjustment disorder with mixed anxiety and depressed mood: Secondary | ICD-10-CM | POA: Diagnosis not present

## 2016-06-14 ENCOUNTER — Encounter: Payer: Self-pay | Admitting: Gastroenterology

## 2016-06-21 NOTE — Progress Notes (Signed)
Erroneous

## 2016-06-28 ENCOUNTER — Telehealth: Payer: Self-pay | Admitting: Gastroenterology

## 2016-06-28 ENCOUNTER — Encounter: Payer: Self-pay | Admitting: Gastroenterology

## 2016-06-28 ENCOUNTER — Ambulatory Visit (AMBULATORY_SURGERY_CENTER): Payer: Federal, State, Local not specified - PPO | Admitting: Gastroenterology

## 2016-06-28 VITALS — BP 105/71 | HR 79 | Temp 98.7°F | Resp 16 | Ht 63.0 in | Wt 96.0 lb

## 2016-06-28 DIAGNOSIS — E611 Iron deficiency: Secondary | ICD-10-CM

## 2016-06-28 DIAGNOSIS — K529 Noninfective gastroenteritis and colitis, unspecified: Secondary | ICD-10-CM | POA: Diagnosis not present

## 2016-06-28 DIAGNOSIS — K633 Ulcer of intestine: Secondary | ICD-10-CM

## 2016-06-28 DIAGNOSIS — K5090 Crohn's disease, unspecified, without complications: Secondary | ICD-10-CM | POA: Diagnosis not present

## 2016-06-28 MED ORDER — SODIUM CHLORIDE 0.9 % IV SOLN: 500.0000 mL | INTRAVENOUS | Status: DC

## 2016-06-28 MED ORDER — MESALAMINE 1.2 G PO TBEC: 1.2000 g | DELAYED_RELEASE_TABLET | Freq: Four times a day (QID) | ORAL | 3 refills | Status: DC

## 2016-06-28 NOTE — Progress Notes (Signed)
Dental advisory given to patient 

## 2016-06-28 NOTE — Progress Notes (Signed)
Called to room to assist during endoscopic procedure.  Patient ID and intended procedure confirmed with present staff. Received instructions for my participation in the procedure from the performing physician.  

## 2016-06-28 NOTE — Op Note (Signed)
[place] Patient Name: Janice Zuniga Procedure Date: 06/28/2016 1:31 PM MRN: [id number] Endoscopist: Remo Lipps P. Havery Moros , MD Age: 45 Referring MD:  Date of Birth: 11/18/1970 Gender: Female Account #: [id number] Procedure:                Upper GI endoscopy Indications:              Iron deficiency anemia Medicines:                Monitored Anesthesia Care Procedure:                Pre-Anesthesia Assessment:                           - Prior to the procedure, a History and Physical                            was performed, and patient medications and                            allergies were reviewed. The patient's tolerance of                            previous anesthesia was also reviewed. The risks                            and benefits of the procedure and the sedation                            options and risks were discussed with the patient.                            All questions were answered, and informed consent                            was obtained. Prior Anticoagulants: The patient has                            taken no previous anticoagulant or antiplatelet                            agents. ASA Grade Assessment: II - A patient with                            mild systemic disease. After reviewing the risks                            and benefits, the patient was deemed in                            satisfactory condition to undergo the procedure.                           After obtaining informed consent, the endoscope was  passed under direct vision. Throughout the                            procedure, the patient's blood pressure, pulse, and                            oxygen saturations were monitored continuously. The                            Model GIF-HQ190 [id number]) scope was introduced                            through the mouth, and advanced to the second part                            of duodenum. The upper GI  endoscopy was                            accomplished without difficulty. The patient                            tolerated the procedure well. Scope In: Scope Out: Findings:                 Esophagogastric landmarks were identified: the                            Z-line was found at 36 cm, the gastroesophageal                            junction was found at 36 cm and the upper extent of                            the gastric folds was found at 36 cm from the                            incisors.                           The exam of the esophagus was otherwise normal.                           The entire examined stomach was normal other than                            suspected instrinsic compression noted in the                            proximal body / fundus, versus less likely                            subepithelial lesion.                           The duodenal bulb and second portion  of the                            duodenum were normal. Biopsies were taken with a                            cold forceps for histology. Complications:            No immediate complications. Estimated blood loss:                            Minimal. Estimated Blood Loss:     Estimated blood loss was minimal. Impression:               - Esophagogastric landmarks identified.                           - Normal esophagus                           - Normal stomach with suspected extrinsic                            compression of the stomach versus less likely                            subepithelial lesion.                           - Normal duodenal bulb and second portion of the                            duodenum. Biopsied. Recommendation:           - Patient has a contact number available for                            emergencies. The signs and symptoms of potential                            delayed complications were discussed with the                            patient. Return to normal  activities tomorrow.                            Written discharge instructions were provided to the                            patient.                           - Resume previous diet.                           - Continue present medications.                           -  Await pathology results.                           - MR enterography to further evaluate small bowel                            as outlined in colon report, will evaluate for                            extrinsic compression of the stomach on this exam Steven P. Armbruster, MD 06/28/2016 2:19:22 PM This report has been signed electronically.

## 2016-06-28 NOTE — Op Note (Signed)
[facility] Patient Name: Janice Zuniga Procedure Date: 06/28/2016 1:29 PM MRN: [id number] Endoscopist: Remo Lipps P. Havery Moros , MD Age: 45 Referring MD:  Date of Birth: 1970-11-25 Gender: Female Account #: [id number] Procedure:                Colonoscopy Indications:              Unexplained iron deficiency anemia, joint pains,                            prior colonoscopy with inflammatory changes of the                            right colon thought to be due to NSAIDs. Medicines:                Monitored Anesthesia Care Procedure:                Pre-Anesthesia Assessment:                           - Prior to the procedure, a History and Physical                            was performed, and patient medications and                            allergies were reviewed. The patient's tolerance of                            previous anesthesia was also reviewed. The risks                            and benefits of the procedure and the sedation                            options and risks were discussed with the patient.                            All questions were answered, and informed consent                            was obtained. Prior Anticoagulants: The patient has                            taken no previous anticoagulant or antiplatelet                            agents. ASA Grade Assessment: II - A patient with                            mild systemic disease. After reviewing the risks                            and benefits, the patient was deemed in  satisfactory condition to undergo the procedure.                           After obtaining informed consent, the colonoscope                            was passed under direct vision. Throughout the                            procedure, the patient's blood pressure, pulse, and                            oxygen saturations were monitored continuously. The                            Model  [id number]-[id number]) scope was introduced                            through the anus and advanced to the the terminal                            ileum, with identification of the appendiceal                            orifice and IC valve. The quality of the bowel                            preparation was good. The terminal ileum, ileocecal                            valve, appendiceal orifice, and rectum were                            photographed. Scope In: 1:43:28 PM Scope Out: 2:03:52 PM Scope Withdrawal Time: 0 hours 13 minutes 34 seconds  Total Procedure Duration: 0 hours 20 minutes 24 seconds  Findings:                 The perianal and digital rectal examinations were                            normal.                           The terminal ileum contained a single ulceration.                            Biopsies were taken with a cold forceps for                            histology.                           Inflammation characterized by erythema, friability  and deep ulcerations was found in a patchy                            distribution in the proximal transverse and right                            colon This was moderate in severity. Biopsies were                            taken with a cold forceps for histology.                           Non-bleeding internal hemorrhoids were found during                            retroflexion.                           The colon was tortuous.                           The exam was otherwise without abnormality. No                            inflammatory changes appreciated in the left colon. Complications:            No immediate complications. Estimated blood loss:                            Minimal. Estimated Blood Loss:     Estimated blood loss was minimal. Impression:               - A single ulcer in the terminal ileum. 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 This was moderate                            in severity. Biopsied.                           - Non-bleeding internal hemorrhoids.                           - Tortuous colon.                           - The examination was otherwise normal. Recommendation:           - Patient has a contact number available for  emergencies. The signs and symptoms of potential                            delayed complications were discussed with the                            patient. Return to normal activities tomorrow.                            Written discharge instructions were provided to the                            patient.                           - Resume previous diet.                           - Continue present medications.                           - No aspirin, ibuprofen, naproxen, or other                            non-steroidal anti-inflammatory drugs.                           - Await pathology results                           - Start Lialda 4 tabs per day (total 4.8gm / day)                            while pathology pending                           - Recommend MR enterography to further evaluate                            small bowel for Crohns disease                           - Repeat colonoscopy to evaluate the response to                            therapy. Remo Lipps P. Armbruster, MD 06/28/2016 2:15:44 PM This report has been signed electronically.

## 2016-06-28 NOTE — Patient Instructions (Addendum)
NO ASPIRIN, ASPIRIN PRODUCTS AND NSAIDS (MOTRIN, IBUPROFEN,ADVIL, NAPROSYN, ALEVE, ETC.)  DR. Doyne Keel OFFICE WILL CALL YOU WITH SCHEDULE OF MR ENTEROPATHY.     YOU HAD AN ENDOSCOPIC PROCEDURE TODAY AT [facility]:   Refer to the procedure report that was given to you for any specific questions about what was found during the examination.  If the procedure report does not answer your questions, please call your gastroenterologist to clarify.  If you requested that your care partner not be given the details of your procedure findings, then the procedure report has been included in a sealed envelope for you to review at your convenience later.  YOU SHOULD EXPECT: Some feelings of bloating in the abdomen. Passage of more gas than usual.  Walking can help get rid of the air that was put into your GI tract during the procedure and reduce the bloating. If you had a lower endoscopy (such as a colonoscopy or flexible sigmoidoscopy) you may notice spotting of blood in your stool or on the toilet paper. If you underwent a bowel prep for your procedure, you may not have a normal bowel movement for a few days.  Please Note:  You might notice some irritation and congestion in your nose or some drainage.  This is from the oxygen used during your procedure.  There is no need for concern and it should clear up in a day or so.  SYMPTOMS TO REPORT IMMEDIATELY:   Following lower endoscopy (colonoscopy or flexible sigmoidoscopy):  Excessive amounts of blood in the stool  Significant tenderness or worsening of abdominal pains  Swelling of the abdomen that is new, acute  Fever of 100F or higher   Following upper endoscopy (EGD)  Vomiting of blood or coffee ground material  New chest pain or pain under the shoulder blades  Painful or persistently difficult swallowing  New shortness of breath  Fever of 100F or higher  Black, tarry-looking stools  For urgent or emergent issues, a  gastroenterologist can be reached at any hour by calling [phone].   DIET:  We do recommend a small meal at first, but then you may proceed to your regular diet.  Drink plenty of fluids but you should avoid alcoholic beverages for 24 hours.  ACTIVITY:  You should plan to take it easy for the rest of today and you should NOT DRIVE or use heavy machinery until tomorrow (because of the sedation medicines used during the test).    FOLLOW UP: Our staff will call the number listed on your records the next business day following your procedure to check on you and address any questions or concerns that you may have regarding the information given to you following your procedure. If we do not reach you, we will leave a message.  However, if you are feeling well and you are not experiencing any problems, there is no need to return our call.  We will assume that you have returned to your regular daily activities without incident.  If any biopsies were taken you will be contacted by phone or by letter within the next 1-3 weeks.  Please call us at [phone] if you have not heard about the biopsies in 3 weeks.    SIGNATURES/CONFIDENTIALITY: You and/or your care partner have signed paperwork which will be entered into your electronic medical record.  These signatures attest to the fact that that the information above on your After Visit Summary has been reviewed and is understood.  Full responsibility of the confidentiality of this discharge information lies with you and/or your care-partner.

## 2016-06-28 NOTE — Telephone Encounter (Signed)
Patient states that she did finish the prep and after drinking water she started throwing up this a.m. Dose.  She was able to drink all of her dose yesterday evening.  She states that her stools are clear not cloudy.  I advised her that it was ok to have her procedure as long as her stools are clear.  Also reminded her not to have anything by mouth 2 hours prior.  All questions were answered.

## 2016-06-28 NOTE — Progress Notes (Signed)
A/ox3 pleased with MAC, report to Karen RN 

## 2016-07-01 ENCOUNTER — Other Ambulatory Visit: Payer: Self-pay

## 2016-07-01 ENCOUNTER — Telehealth: Payer: Self-pay | Admitting: *Deleted

## 2016-07-01 NOTE — Telephone Encounter (Signed)
  Follow up Call-  Call back number 06/28/2016  Post procedure Call Back phone  # [phone]  Permission to leave phone message Yes     Patient questions:  Do you have a fever, pain , or abdominal swelling? No. Pain Score  0 *  Have you tolerated food without any problems? Yes.    Have you been able to return to your normal activities? Yes.    Do you have any questions about your discharge instructions: Diet   No. Medications  No. Follow up visit  No.  Do you have questions or concerns about your Care? No.  Actions: * If pain score is 4 or above: No action needed, pain <4.

## 2016-07-05 ENCOUNTER — Ambulatory Visit: Payer: Federal, State, Local not specified - PPO | Admitting: Family

## 2016-07-05 ENCOUNTER — Other Ambulatory Visit: Payer: Federal, State, Local not specified - PPO

## 2016-07-05 ENCOUNTER — Telehealth: Payer: Self-pay

## 2016-07-05 ENCOUNTER — Other Ambulatory Visit: Payer: Self-pay

## 2016-07-05 DIAGNOSIS — K50019 Crohn's disease of small intestine with unspecified complications: Secondary | ICD-10-CM

## 2016-07-05 NOTE — Telephone Encounter (Signed)
Patient advised that her MRI enterography is scheduled at [facility] on 10/24, arrive at 7:30 am for a 9:00 am test. NPO after midnight.

## 2016-07-08 ENCOUNTER — Other Ambulatory Visit: Payer: Self-pay | Admitting: Family

## 2016-07-08 ENCOUNTER — Encounter: Payer: Self-pay | Admitting: Family

## 2016-07-08 ENCOUNTER — Ambulatory Visit (HOSPITAL_BASED_OUTPATIENT_CLINIC_OR_DEPARTMENT_OTHER): Payer: Federal, State, Local not specified - PPO | Admitting: Family

## 2016-07-08 ENCOUNTER — Other Ambulatory Visit: Payer: Self-pay

## 2016-07-08 ENCOUNTER — Other Ambulatory Visit (HOSPITAL_BASED_OUTPATIENT_CLINIC_OR_DEPARTMENT_OTHER): Payer: Federal, State, Local not specified - PPO

## 2016-07-08 VITALS — BP 102/69 | HR 77 | Temp 97.7°F | Resp 16 | Ht 63.0 in | Wt 95.4 lb

## 2016-07-08 DIAGNOSIS — R771 Abnormality of globulin: Secondary | ICD-10-CM

## 2016-07-08 DIAGNOSIS — D509 Iron deficiency anemia, unspecified: Secondary | ICD-10-CM

## 2016-07-08 DIAGNOSIS — D500 Iron deficiency anemia secondary to blood loss (chronic): Secondary | ICD-10-CM

## 2016-07-08 DIAGNOSIS — K5010 Crohn's disease of large intestine without complications: Secondary | ICD-10-CM

## 2016-07-08 LAB — CBC WITH DIFFERENTIAL (CANCER CENTER ONLY)
BASO#: 0.1 10*3/uL (ref 0.0–0.2)
BASO%: 0.5 % (ref 0.0–2.0)
EOS%: 1.1 % (ref 0.0–7.0)
Eosinophils Absolute: 0.1 10*3/uL (ref 0.0–0.5)
HEMATOCRIT: 38.7 % (ref 34.8–46.6)
HEMOGLOBIN: 13.3 g/dL (ref 11.6–15.9)
LYMPH#: 2.1 10*3/uL (ref 0.9–3.3)
LYMPH%: 19.7 % (ref 14.0–48.0)
MCH: 29.2 pg (ref 26.0–34.0)
MCHC: 34.4 g/dL (ref 32.0–36.0)
MCV: 85 fL (ref 81–101)
MONO#: 0.7 10*3/uL (ref 0.1–0.9)
MONO%: 6.2 % (ref 0.0–13.0)
NEUT%: 72.5 % (ref 39.6–80.0)
NEUTROS ABS: 7.7 10*3/uL — AB (ref 1.5–6.5)
Platelets: 374 10*3/uL (ref 145–400)
RBC: 4.55 10*6/uL (ref 3.70–5.32)
RDW: 13.5 % (ref 11.1–15.7)
WBC: 10.7 10*3/uL — AB (ref 3.9–10.0)

## 2016-07-08 NOTE — Progress Notes (Signed)
Hematology and Oncology Follow Up Visit  Janice Zuniga [id number] 07-23-1971 45 y.o. 07/08/2016   Principle Diagnosis:  1. Elevated serum globulin level 2. Iron deficiency anemia 3. HLA B27 positive and spondyloarthritis   Current Therapy:   IV iron as indicated     Interim History:  Janice Zuniga is here today for a follow-up. She is doing fairly well. She states that she had a colonoscopy earlier this month which indicated she likely has Chron's. She is scheduled to have an MRI of the abdomen and pelvis on October 24.  She has stopped taking NSAIDS and is trying a holistic approach to obtaining a more healthy gut. She has stopped her Mesalamine and is taking a combination of probiotics for now instead to see if this will help. She has had no abdominal pain and denies noticing any dark tarry, maroon or bloody stool.  No bruising or petechiae. No lymphadenopathy found on exam.  Her job is still stressful but she is transitioning into a new position where she will not be required to travel as often which will help lower her stress level tremendously.  M-spike was not detected in July. Levels today are pending.  No fever, chills, n/v, cough, rash, dizziness, SOB, chest pain, palpitations or changes in bowel or bladder habits.  No swelling, tenderness, numbness or tingling in her extremities. She is still having some lower back discomfort that comes and goes when sitting. She has to sit a lot in the car which seems to aggravate the pain.  She has maintained a good appetite and is staying well hydrated. Her weight is down 5 lbs since her last visit. She is increasing her caloric intake and attempting to gain weight.      Medications:    Medication List       Accurate as of 07/08/16  3:47 PM. Always use your most recent med list.          cholecalciferol 1000 units tablet Commonly known as:  VITAMIN D Take 1,000 Units by mouth 2 (two) times daily. Reported on 03/29/2016   Iron 90 (18  Fe) MG Tabs Take 2 capsules by mouth 2 (two) times daily.   PROAIR HFA 108 (90 Base) MCG/ACT inhaler Generic drug:  albuterol Inhale 2 puffs into the lungs every 4 (four) hours as needed. Reported on 03/29/2016       Allergies:  Allergies  Allergen Reactions  . Gluten Meal Other (See Comments)    Auto immune  . Tramadol     nausea  . Codeine Nausea And Vomiting  . Peanut-Containing Drug Products Other (See Comments)    By allergy test...has eaten peanuts without issues     Past Medical History, Surgical history, Social history, and Family History were reviewed and updated.  Review of Systems: All other 10 point review of systems is negative.   Physical Exam:  height is 5' 3"  (1.6 m) and weight is 95 lb 6.4 oz (43.3 kg). Her oral temperature is 97.7 F (36.5 C). Her blood pressure is 102/69 and her pulse is 77. Her respiration is 16.   Wt Readings from Last 3 Encounters:  07/08/16 95 lb 6.4 oz (43.3 kg)  06/28/16 96 lb (43.5 kg)  04/29/16 96 lb (43.5 kg)    Ocular: Sclerae unicteric, pupils equal, round and reactive to light Ear-nose-throat: Oropharynx clear, dentition fair Lymphatic: No cervical supraclavicular or axillary adenopathy Lungs no rales or rhonchi, good excursion bilaterally Heart regular rate and rhythm, no murmur  appreciated Abd soft, nontender, positive bowel sounds, no liver or spleen tip palpated on exam, no fluid wave  MSK no focal spinal tenderness, no joint edema Neuro: non-focal, well-oriented, appropriate affect Breasts: Deferred  Lab Results  Component Value Date   WBC 10.7 (H) 07/08/2016   HGB 13.3 07/08/2016   HCT 38.7 07/08/2016   MCV 85 07/08/2016   PLT 374 07/08/2016   Lab Results  Component Value Date   FERRITIN 1,214 (H) 05/09/2016   IRON 27 (L) 05/09/2016   TIBC 153 (L) 05/09/2016   UIBC 126 05/09/2016   IRONPCTSAT 18 (L) 05/09/2016   Lab Results  Component Value Date   RBC 4.55 07/08/2016   Lab Results  Component Value  Date   KPAFRELGTCHN 2.13 (H) 07/31/2015   LAMBDASER 1.58 07/31/2015   KAPLAMBRATIO 1.37 03/29/2016   Lab Results  Component Value Date   IGGSERUM 1,530 03/29/2016   IGA 359 07/31/2015   IGMSERUM 230 (H) 03/29/2016   Lab Results  Component Value Date   TOTALPROTELP 6.8 07/31/2015   ALBUMINELP 3.1 (L) 07/31/2015   A1GS 0.4 (H) 07/31/2015   A2GS 0.8 07/31/2015   BETS 0.5 07/31/2015   BETA2SER 0.5 07/31/2015   GAMS 1.5 07/31/2015   MSPIKE Not Observed 03/29/2016   SPEI * 07/31/2015     Chemistry      Component Value Date/Time   NA 139 09/15/2015 1438   K 4.4 09/15/2015 1438   CL 104 09/15/2015 1438   CO2 23 09/15/2015 1438   BUN 10 09/15/2015 1438   CREATININE 0.60 09/15/2015 1438      Component Value Date/Time   CALCIUM 9.4 09/15/2015 1438   ALKPHOS 68 09/08/2015 0812   AST 14 09/08/2015 0812   ALT 10 09/08/2015 0812   BILITOT 0.4 09/08/2015 0812     Impression and Plan: Janice Zuniga is avery pleasant 45 yo African American female with history of iron deficiency anemia. She is doing fairly well and has found out that she likely has Chron's. She has an abdominal and pelvic MRI scheduled for next week to further evaluate. This would certainly explain her iron deficiency. She is not symptomatic of anemia at this time. CBC looks good. Her MCV continues to improve and Hgb is stable at 13.3.  We will see what her iron studies show and bring her back in next week for an infusion if needed.  We will plan to see her back in 3 months for repeat labs and follow-up.  She will contact our office with any questions or concerns. We can certainly see her sooner if need be.   [name], NP 10/16/20173:47 PM

## 2016-07-09 ENCOUNTER — Other Ambulatory Visit: Payer: Self-pay

## 2016-07-09 LAB — PROTEIN ELECTROPHORESIS, SERUM, WITH REFLEX
A/G RATIO SPE: 0.9 (ref 0.7–1.7)
ALPHA 1: 0.4 g/dL (ref 0.0–0.4)
Albumin: 3.5 g/dL (ref 2.9–4.4)
Alpha 2: 0.8 g/dL (ref 0.4–1.0)
Beta: 1.2 g/dL (ref 0.7–1.3)
GLOBULIN, TOTAL: 4 g/dL — AB (ref 2.2–3.9)
Gamma Globulin: 1.6 g/dL (ref 0.4–1.8)
PDF: 0
TOTAL PROTEIN: 7.5 g/dL (ref 6.0–8.5)

## 2016-07-09 LAB — KAPPA/LAMBDA LIGHT CHAINS
IG LAMBDA FREE LIGHT CHAIN: 20.9 mg/L (ref 5.7–26.3)
Ig Kappa Free Light Chain: 26.1 mg/L — ABNORMAL HIGH (ref 3.3–19.4)
Kappa/Lambda FluidC Ratio: 1.25 (ref 0.26–1.65)

## 2016-07-09 LAB — IGG, IGA, IGM
IgA, Qn, Serum: 418 mg/dL — ABNORMAL HIGH (ref 87–352)
IgM, Qn, Serum: 224 mg/dL — ABNORMAL HIGH (ref 26–217)

## 2016-07-09 LAB — IRON AND TIBC
%SAT: 20 % — ABNORMAL LOW (ref 21–57)
Iron: 34 ug/dL — ABNORMAL LOW (ref 41–142)
TIBC: 170 ug/dL — AB (ref 236–444)
UIBC: 136 ug/dL (ref 120–384)

## 2016-07-09 LAB — FERRITIN: Ferritin: 2024 ng/ml — ABNORMAL HIGH (ref 9–269)

## 2016-07-09 LAB — RETICULOCYTES: Reticulocyte Count: 1.1 % (ref 0.6–2.6)

## 2016-07-10 ENCOUNTER — Telehealth: Payer: Self-pay | Admitting: *Deleted

## 2016-07-10 ENCOUNTER — Ambulatory Visit: Payer: Federal, State, Local not specified - PPO | Admitting: Psychology

## 2016-07-10 ENCOUNTER — Other Ambulatory Visit: Payer: Self-pay

## 2016-07-10 ENCOUNTER — Ambulatory Visit (INDEPENDENT_AMBULATORY_CARE_PROVIDER_SITE_OTHER): Payer: Federal, State, Local not specified - PPO | Admitting: Psychology

## 2016-07-10 DIAGNOSIS — F4323 Adjustment disorder with mixed anxiety and depressed mood: Secondary | ICD-10-CM | POA: Diagnosis not present

## 2016-07-10 NOTE — Telephone Encounter (Signed)
-----   Message from [name], NP sent at 07/10/2016  2:39 PM EDT ----- Regarding: Iron  Iron has improved. No infusion needed at this time. Thank you!  Janice Zuniga  ----- Message ----- From: Interface, Lab In Three Zero One Sent: 07/08/2016   3:31 PM To: [name], NP

## 2016-07-10 NOTE — Progress Notes (Signed)
Letter mailed 07/10/16.

## 2016-07-12 DIAGNOSIS — M545 Low back pain: Secondary | ICD-10-CM | POA: Diagnosis not present

## 2016-07-12 DIAGNOSIS — M542 Cervicalgia: Secondary | ICD-10-CM | POA: Diagnosis not present

## 2016-07-12 DIAGNOSIS — M25512 Pain in left shoulder: Secondary | ICD-10-CM | POA: Diagnosis not present

## 2016-07-12 DIAGNOSIS — M25511 Pain in right shoulder: Secondary | ICD-10-CM | POA: Diagnosis not present

## 2016-07-15 DIAGNOSIS — M25512 Pain in left shoulder: Secondary | ICD-10-CM | POA: Diagnosis not present

## 2016-07-15 DIAGNOSIS — M545 Low back pain: Secondary | ICD-10-CM | POA: Diagnosis not present

## 2016-07-15 DIAGNOSIS — M25511 Pain in right shoulder: Secondary | ICD-10-CM | POA: Diagnosis not present

## 2016-07-15 DIAGNOSIS — M542 Cervicalgia: Secondary | ICD-10-CM | POA: Diagnosis not present

## 2016-07-16 ENCOUNTER — Ambulatory Visit (HOSPITAL_COMMUNITY): Payer: Federal, State, Local not specified - PPO

## 2016-07-23 ENCOUNTER — Ambulatory Visit (HOSPITAL_COMMUNITY)
Admission: RE | Admit: 2016-07-23 | Discharge: 2016-07-23 | Disposition: A | Discharge status: Home / Self Care | Payer: Federal, State, Local not specified - PPO | Source: Ambulatory Visit | Attending: Gastroenterology | Admitting: Gastroenterology

## 2016-07-23 ENCOUNTER — Ambulatory Visit (HOSPITAL_COMMUNITY): Payer: Federal, State, Local not specified - PPO

## 2016-07-23 DIAGNOSIS — M25511 Pain in right shoulder: Secondary | ICD-10-CM | POA: Diagnosis not present

## 2016-07-23 DIAGNOSIS — M25512 Pain in left shoulder: Secondary | ICD-10-CM | POA: Diagnosis not present

## 2016-07-23 DIAGNOSIS — K5000 Crohn's disease of small intestine without complications: Secondary | ICD-10-CM | POA: Diagnosis not present

## 2016-07-23 DIAGNOSIS — K50019 Crohn's disease of small intestine with unspecified complications: Secondary | ICD-10-CM | POA: Insufficient documentation

## 2016-07-23 DIAGNOSIS — M542 Cervicalgia: Secondary | ICD-10-CM | POA: Diagnosis not present

## 2016-07-23 DIAGNOSIS — M545 Low back pain: Secondary | ICD-10-CM | POA: Diagnosis not present

## 2016-07-23 MED ORDER — GADOBENATE DIMEGLUMINE 529 MG/ML IV SOLN
10.0000 mL | Freq: Once | INTRAVENOUS | Status: AC | PRN
  Administered 2016-07-23: 9 mL via INTRAVENOUS

## 2016-07-25 ENCOUNTER — Ambulatory Visit (INDEPENDENT_AMBULATORY_CARE_PROVIDER_SITE_OTHER): Payer: Federal, State, Local not specified - PPO | Admitting: Gastroenterology

## 2016-07-25 ENCOUNTER — Encounter: Payer: Self-pay | Admitting: Gastroenterology

## 2016-07-25 ENCOUNTER — Other Ambulatory Visit (INDEPENDENT_AMBULATORY_CARE_PROVIDER_SITE_OTHER): Payer: Federal, State, Local not specified - PPO

## 2016-07-25 ENCOUNTER — Other Ambulatory Visit: Payer: Federal, State, Local not specified - PPO

## 2016-07-25 VITALS — BP 96/68 | HR 92 | Ht 62.5 in | Wt 94.0 lb

## 2016-07-25 DIAGNOSIS — K50119 Crohn's disease of large intestine with unspecified complications: Secondary | ICD-10-CM

## 2016-07-25 DIAGNOSIS — D649 Anemia, unspecified: Secondary | ICD-10-CM

## 2016-07-25 LAB — HEPATIC FUNCTION PANEL
ALK PHOS: 77 U/L (ref 39–117)
ALT: 9 U/L (ref 0–35)
AST: 12 U/L (ref 0–37)
Albumin: 3.9 g/dL (ref 3.5–5.2)
BILIRUBIN DIRECT: 0.1 mg/dL (ref 0.0–0.3)
BILIRUBIN TOTAL: 0.5 mg/dL (ref 0.2–1.2)
Total Protein: 7.9 g/dL (ref 6.0–8.3)

## 2016-07-25 LAB — HIGH SENSITIVITY CRP: CRP HIGH SENSITIVITY: 42.65 mg/L — AB (ref 0.000–5.000)

## 2016-07-25 LAB — SEDIMENTATION RATE: Sed Rate: 31 mm/hr — ABNORMAL HIGH (ref 0–20)

## 2016-07-25 MED ORDER — MESALAMINE 1.2 G PO TBEC: 4.8000 g | DELAYED_RELEASE_TABLET | Freq: Every day | ORAL | 3 refills | Status: DC

## 2016-07-25 NOTE — Patient Instructions (Signed)
If you are age 46 or older, your body mass index should be between 23-30. Your Body mass index is 16.92 kg/m. If this is out of the aforementioned range listed, please consider follow up with your Primary Care Provider.  If you are age 30 or younger, your body mass index should be between 19-25. Your Body mass index is 16.92 kg/m. If this is out of the aformentioned range listed, please consider follow up with your Primary Care Provider.   Your physician has requested that you go to the basement for the following lab work before leaving today:  We have sent the following medications to your pharmacy for you to pick up at your convenience:  Lialda 4.8g. Take one tablet daily.  Please follow up with Dr Havery Moros in 3 months.  Thank you.

## 2016-07-25 NOTE — Progress Notes (Signed)
HPI :  INTAKE HISTORY 45 y/o female referred for iron deficiency anemia with PMH of reported colitis, myalgias / chronic joint pains, and menorrhagia. Also HLA B27 positive. She thinks she has had iron deficiency anemia since college. She reports heavy menses her entire life which led to hysterectomy last year due to fibroids. She has had the hysterectomy in November 2016, she thinks her iron stores have continued to fluctuate. She reports she is seeing hematology and on IV iron. 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   She denies any diarrhea or problems with her bowels at this time. She states last year she was taking a lot of NSAIDs for joint pains, but she thinks she has tried removing lectins from her diet which she thinks has helped her feel better for the past moth. She has been gluten free for 5-6 years at this time but has had negative celiac testing in the past. She has tried to avoid all NSAIDs, and she has been off all pain medications for the past month or so. She reports chronic muscular pains for which she had seen Rheumatology. She is seeing physical therapy.   She has roughly 1 BM per day or so. No blood in the stools. No nausea or vomiting. No abdominal pains. She thinks her weight has fluctuated a bit, perhaps lost a little bit since her new diet. She was seen by [facility] in August 2016 - she had bloody stools at one point and abdominal pain, CT scan obtained showing thickening of the right colon and cecum. She had a colonoscopy at [facility] last year. This was done on 06/08/2015 with multiple ulcers noted throughout the colon, worst in the right colon. Normal ileum. Hemorrhoids. Biopsies taken showing active chronic colitis. Patient states she thinks she was told this was due to NSAIDs or Crohns, not sure at the time. She was given mesalamine at the time which she thinks helped but she since stopped it around  in January time frame or so. She also reports being on a course of prednisone in the past for "aches" but does not know the details of this.   She has never had a prior upper endoscopy.   Colonoscopy 2013 reportedly normal  SINCE LAST VISIT:  EGD 06/28/16 - normal esophagus, normal stomach with extrinsic compression of stomach, normal duodenum Colonoscopy 06/28/16 - ileum with a single ulcer, inflammation of proximal transverse colon to right colon - path shows chronic active colitis and chronic active ileitis MRE 07/23/16 - normal small bowel, no significant inflammatory changes noted.  She reports normal bowel movements. No blood in the stools. No abdominal pains. She thinks she has lost a few lbs recently. She is trying to eat more calories. No fevers. She reports joint pains not significantly bothering her. She is following with PT for joint pains in her beck / back. Of note on labs her ferritin has been markedly elevated. No new rashes.   She has tried to do more natural based therapy for her colitis, she has not used the Lialda as recommended. She has been using protocol on 'listentoyourgut.com" for her Crohns therapy. She has been using probiotics (lactobacillus / bifidobacter), Aloevera, multivitamin, iron supplement, natural thyroid supplement, oil of oregano, whey protein, flax seed oil, cod liver oil, vitamin B12 / folate supplements. She has been using this for 2-3 weeks so far. She took Chad for  3 days but then stopped   To clarify FH, father had prostate CA and not colon cancer.   Past Medical History:  Diagnosis Date  . Abnormal CT of the abdomen   . Abnormal uterine bleeding   . Anemia    iron def  . Chicken pox   . Colitis   . Dysrhythmia    occassional fluttering noted by patient  . Genital warts   . Gluten intolerance   . History of chlamydia   . History of hysterectomy 08/21/15  . HLA B27 (HLA B27 positive)   . HSV-1 (herpes simplex virus 1) infection   . Low  calcium levels   . Low iron   . Low serum vitamin D   . Muscle spasms of both lower extremities    Spasms in lower back  . Non-celiac gluten sensitivity   . Reactive airway disease   . Spondyloarthritis ([place])    genetic marker  . UTI (lower urinary tract infection)      Past Surgical History:  Procedure Laterality Date  . ABDOMINAL HYSTERECTOMY  08/21/15  . BILATERAL SALPINGECTOMY Bilateral 08/21/2015   Procedure: BILATERAL SALPINGECTOMY;  Surgeon: Nunzio Cobbs, MD;  Location: [facility];  Service: Gynecology;  Laterality: Bilateral;  . COLONOSCOPY    . CYSTOSCOPY N/A 08/21/2015   Procedure: CYSTOSCOPY;  Surgeon: Nunzio Cobbs, MD;  Location: [facility];  Service: Gynecology;  Laterality: N/A;  . MOUTH SURGERY     cyst in mouth  . ROBOTIC ASSISTED TOTAL HYSTERECTOMY WITH SALPINGECTOMY Bilateral 08/21/2015   Procedure: ROBOTIC ASSISTED TOTAL HYSTERECTOMY ;  Surgeon: Nunzio Cobbs, MD;  Location: [facility];  Service: Gynecology;  Laterality: Bilateral;  . WISDOM TOOTH EXTRACTION     Family History  Problem Relation Age of Onset  . Hypertension Mother   . Diabetes Mother     ? father  . Cancer Paternal Grandmother     ? type  . Diabetes Father   . Colon polyps Father   . Prostate cancer Father    Social History  Substance Use Topics  . Smoking status: Never Smoker  . Smokeless tobacco: Never Used  . Alcohol use 1.2 oz/week    2 Standard drinks or equivalent per week   Current Outpatient Prescriptions  Medication Sig Dispense Refill  . ALOE PO Take 1 Dose by mouth 2 (two) times daily. George's Active Aloe    . AMBULATORY NON FORMULARY MEDICATION Thyroplex for Women Take 1 tablet by mouth once daiy    . Cyanocobalamin (VITAMIN B12 PO) Take 1 tablet by mouth daily. B6 and Folic acid    . IRON PO Take 1 Dose by mouth 2 (two) times daily. Angstrom Mineral 1 tsp    . Multiple Vitamin (MULTIVITAMIN) tablet Take 1 tablet by mouth 2 (two) times daily.  Nutricology multivitamin    . PROAIR HFA 108 (90 BASE) MCG/ACT inhaler Inhale 2 puffs into the lungs every 4 (four) hours as needed. Reported on 03/29/2016    . Probiotic Product (PROBIOTIC PO) Take 1 tablet by mouth at bedtime. Natren    . mesalamine (LIALDA) 1.2 g EC tablet Take 4 tablets (4.8 g total) by mouth daily. Mesalamine generic ag, no substitutes 120 tablet 3   Current Facility-Administered Medications  Medication Dose Route Frequency Provider Last Rate Last Dose  . 0.9 %  sodium chloride infusion  500 mL Intravenous Continuous Manus Gunning, MD       Allergies  Allergen Reactions  . Gluten Meal Other (See Comments)    Auto immune  . Tramadol     nausea  . Codeine Nausea And Vomiting  . Peanut-Containing Drug Products Other (See Comments)    By allergy test...has eaten peanuts without issues      Review of Systems: All systems reviewed and negative except where noted in HPI.    Mr Consuela Mimes W/o W/cm  Result Date: 07/23/2016 CLINICAL DATA:  Crohn's disease.  Iron deficiency anemia. EXAM: MR ABDOMEN AND PELVIS WITHOUT AND WITH CONTRAST (MR ENTEROGRAPHY) TECHNIQUE: Multiplanar, multisequence MRI of the abdomen and pelvis was performed both before and during bolus administration of intravenous contrast. Negative oral contrast VoLumen was given. CONTRAST:  50m MULTIHANCE GADOBENATE DIMEGLUMINE 529 MG/ML IV SOLN COMPARISON:  None. FINDINGS: COMBINED FINDINGS FOR BOTH MR ABDOMEN AND PELVIS Lower Chest: No acute findings. Hepatobiliary: Diffuse T2 hypointensity, consistent with hemosiderosis. Scattered tiny fluid intensity cysts are seen, with largest in the left hepatic lobe measuring 9 mm. No liver masses identified. Gallbladder is unremarkable. Pancreas: No mass or inflammatory changes. Normal signal intensity. Spleen: Diffuse T2 hypointensity, consistent with hemosiderosis. No evidence of splenomegaly or splenic lesions. Adrenals/Urinary Tract: No masses identified. No  evidence of hydronephrosis. Stomach/Bowel: No evidence of bowel wall thickening or dilatation. No evidence of abnormal mucosal enhancement or other inflammatory changes. No abscess identified. The terminal ileum is normal in appearance. No other areas of bowel wall thickening identified. Vascular/Lymphatic: No pathologically enlarged lymph nodes. No abdominal aortic aneurysm. Reproductive: Previous hysterectomy. Adnexal regions are unremarkable. Other:  None. Musculoskeletal: No suspicious bone lesions identified. Diffuse bone marrow T2 hypointensity, consistent with hemosiderosis. IMPRESSION: No radiographic evidence of active inflammatory bowel disease or other acute findings. Findings consistent with transfusional hemosiderosis. Electronically Signed   By: JEarle GellM.D.   On: 07/23/2016 12:57   Mr EKerry FortPelvis W/o W/cm  Result Date: 07/23/2016 CLINICAL DATA:  Crohn's disease.  Iron deficiency anemia. EXAM: MR ABDOMEN AND PELVIS WITHOUT AND WITH CONTRAST (MR ENTEROGRAPHY) TECHNIQUE: Multiplanar, multisequence MRI of the abdomen and pelvis was performed both before and during bolus administration of intravenous contrast. Negative oral contrast VoLumen was given. CONTRAST:  965mMULTIHANCE GADOBENATE DIMEGLUMINE 529 MG/ML IV SOLN COMPARISON:  None. FINDINGS: COMBINED FINDINGS FOR BOTH MR ABDOMEN AND PELVIS Lower Chest: No acute findings. Hepatobiliary: Diffuse T2 hypointensity, consistent with hemosiderosis. Scattered tiny fluid intensity cysts are seen, with largest in the left hepatic lobe measuring 9 mm. No liver masses identified. Gallbladder is unremarkable. Pancreas: No mass or inflammatory changes. Normal signal intensity. Spleen: Diffuse T2 hypointensity, consistent with hemosiderosis. No evidence of splenomegaly or splenic lesions. Adrenals/Urinary Tract: No masses identified. No evidence of hydronephrosis. Stomach/Bowel: No evidence of bowel wall thickening or dilatation. No evidence of abnormal  mucosal enhancement or other inflammatory changes. No abscess identified. The terminal ileum is normal in appearance. No other areas of bowel wall thickening identified. Vascular/Lymphatic: No pathologically enlarged lymph nodes. No abdominal aortic aneurysm. Reproductive: Previous hysterectomy. Adnexal regions are unremarkable. Other:  None. Musculoskeletal: No suspicious bone lesions identified. Diffuse bone marrow T2 hypointensity, consistent with hemosiderosis. IMPRESSION: No radiographic evidence of active inflammatory bowel disease or other acute findings. Findings consistent with transfusional hemosiderosis. Electronically Signed   By: JoEarle Gell.D.   On: 07/23/2016 12:57    Physical Exam: BP 96/68 (BP Location: Left Arm, Patient Position: Sitting, Cuff Size: Normal)   Pulse 92   Ht 5' 2.5" (1.588 m) Comment: height measured without shoes  Wt 94 lb (42.6 kg)   LMP 08/07/2015   BMI 16.92 kg/m  Constitutional: Pleasant,well-developed, female in no acute distress. HEENT: Normocephalic and atraumatic. Conjunctivae are normal. No scleral icterus. Neck supple.  Cardiovascular: Normal rate, regular rhythm.  Pulmonary/chest: Effort normal and breath sounds normal. No wheezing, rales or rhonchi. Abdominal: Soft, nondistended, nontender.  There are no masses palpable. No hepatomegaly. Extremities: no edema Lymphadenopathy: No cervical adenopathy noted. Neurological: Alert and oriented to person place and time. Skin: Skin is warm and dry. No rashes noted. Psychiatric: Normal mood and affect. Behavior is normal.   ASSESSMENT AND PLAN: 45 year old female with chronic joint pains, HLA B 27 positive, chronic iron deficiency, presenting for a follow-up what I feel is more than likely Crohn's disease. As above she previously had a colonoscopy with right-sided colon inflammation in 2016 thought to be due to NSAIDs at the time. Now off NSAIDs for several weeks repeat colonoscopy showed significant  right colon inflammation with chronic active colitis on pathology. She had one ileal ulceration with chronic active ileitis. MR enterography showed no significant inflammatory changes of the small bowel.  I explained to her that my impression is that she likely has Crohn's disease predominantly of the colon based on this history, with very mild ileal disease. I explained to her the natural history of Crohn's disease, risks of complications including ongoing inflammation ,stricturing, colon cancer etc. without appropriate therapy. Her preference is to treat this "naturally" if at all possible, however explained on the regimen that she is currently taking there is no evidence I'm aware of that this will put her Crohn's into remission. There is some evidence to show that Alovera juice can be helpful as an adjunctive therapy, and that's fine to continue it. I discussed all treatment options for Crohn's disease with the patient including risks and benefits of each. Given her disease is mostly colonic I think a trial of mesalamine initially is quite reasonable given she is very hesitant to use traditional therapies in general. I counseled her and risks benefits of mesalamine. I would recommend Lialda 4.8 g per day for one month and then 2.4gm daily for maintenance at this time. Her renal function is normal. Her ferritin is extremely high but iron level low, suspect this is an acute phase reactant. Will check ESR and CRP to assess baseline prior to therapy, and check LFTs as well for baseline. I would like to see her again in 3 months. Recommend interval colonoscopy in 6-9 months after therapy to check for interval improvement. She agreed with the plan as outlined. I otherwise did not recommend use of probiotics or other supplements specifically for the use of Crohns or "general health", and adding all of these treatments at once will make it difficult to assess her if she has side effects or is feeling poorly moving  forward. She verbalized understanding.   > 30 minutes spent with patient  [place] Cellar, MD [facility] Pager [phone]

## 2016-07-26 NOTE — Progress Notes (Signed)
Letter mailed

## 2016-07-30 ENCOUNTER — Telehealth: Payer: Self-pay | Admitting: Gastroenterology

## 2016-07-30 NOTE — Telephone Encounter (Signed)
Spoke to patient, she has noticed that a few days after starting her mesalamine she started having headaches, what she describes as shortness of breath, not chest pain, and increase in her fatigue. She also reports that she has lost 2 more pounds, weighs 92 lbs this morning. She has taken Tylenol to help relieve her headaches, she did not take her mesalamine this morning. Her shortness of breath is better, still fatigued and the Tylenol helped relieve her headache today. Please advise.

## 2016-07-30 NOTE — Telephone Encounter (Signed)
Called patient back, lvm with instructions to stop taking the Lialda for a few days then restart taking only 2 tablets and not the 4. Asked that if symptoms persist to call back to the office. I let her know that if she had further questions with the message to call back in the morning.

## 2016-07-30 NOTE — Telephone Encounter (Signed)
I would wait a few days and then try Lialda again at 2 tablets per day and see if she tolerates this, perhaps the 4 tabs was too much for her and this can cause headache rarely. If it occurs with 2 tablets / day she should contact us for further advice. Thanks

## 2016-08-02 ENCOUNTER — Ambulatory Visit (INDEPENDENT_AMBULATORY_CARE_PROVIDER_SITE_OTHER): Payer: Federal, State, Local not specified - PPO | Admitting: Psychology

## 2016-08-02 DIAGNOSIS — M25511 Pain in right shoulder: Secondary | ICD-10-CM | POA: Diagnosis not present

## 2016-08-02 DIAGNOSIS — F4323 Adjustment disorder with mixed anxiety and depressed mood: Secondary | ICD-10-CM

## 2016-08-02 DIAGNOSIS — M542 Cervicalgia: Secondary | ICD-10-CM | POA: Diagnosis not present

## 2016-08-02 DIAGNOSIS — M25512 Pain in left shoulder: Secondary | ICD-10-CM | POA: Diagnosis not present

## 2016-08-02 DIAGNOSIS — M545 Low back pain: Secondary | ICD-10-CM | POA: Diagnosis not present

## 2016-08-06 ENCOUNTER — Telehealth: Payer: Self-pay | Admitting: Internal Medicine

## 2016-08-06 NOTE — Telephone Encounter (Signed)
Pt returning call [phone].Janice Zuniga

## 2016-08-06 NOTE — Telephone Encounter (Signed)
Lmtcb. Will await call back 

## 2016-08-06 NOTE — Telephone Encounter (Signed)
Spoke with pt. Advised her that she would need an appointment before we can refill her medication. OV has been scheduled for 08/07/2016 at 9am. Nothing further was needed.

## 2016-08-07 ENCOUNTER — Ambulatory Visit (INDEPENDENT_AMBULATORY_CARE_PROVIDER_SITE_OTHER): Payer: Federal, State, Local not specified - PPO | Admitting: Internal Medicine

## 2016-08-07 ENCOUNTER — Encounter: Payer: Self-pay | Admitting: Internal Medicine

## 2016-08-07 ENCOUNTER — Ambulatory Visit (INDEPENDENT_AMBULATORY_CARE_PROVIDER_SITE_OTHER)
Admission: RE | Admit: 2016-08-07 | Discharge: 2016-08-07 | Disposition: A | Discharge status: Home / Self Care | Payer: Federal, State, Local not specified - PPO | Source: Ambulatory Visit | Attending: Internal Medicine | Admitting: Internal Medicine

## 2016-08-07 VITALS — BP 104/70 | HR 92 | Ht 63.0 in | Wt 93.8 lb

## 2016-08-07 DIAGNOSIS — J4530 Mild persistent asthma, uncomplicated: Secondary | ICD-10-CM

## 2016-08-07 DIAGNOSIS — R0600 Dyspnea, unspecified: Secondary | ICD-10-CM | POA: Diagnosis not present

## 2016-08-07 LAB — NITRIC OXIDE: Nitric Oxide: 13

## 2016-08-07 MED ORDER — MOMETASONE FURO-FORMOTEROL FUM 100-5 MCG/ACT IN AERO: 2.0000 | INHALATION_SPRAY | Freq: Two times a day (BID) | RESPIRATORY_TRACT | 11 refills | Status: DC

## 2016-08-07 MED ORDER — MOMETASONE FURO-FORMOTEROL FUM 100-5 MCG/ACT IN AERO: 2.0000 | INHALATION_SPRAY | Freq: Two times a day (BID) | RESPIRATORY_TRACT | 0 refills | Status: DC

## 2016-08-07 NOTE — Progress Notes (Signed)
Subjective:    Patient ID: Janice Zuniga, female    DOB: 1971/08/06  MRN: [id number]    Brief patient profile:  89 yobf never smoker with some sniffles itchy throat in Spring x decades controlled originally with otcs like  clariton with need for inhaler transiently 2012 only while dog in house then w/in a week of moving to  [place] from [employer] apt since May 13 2014 but symptoms started earlier in August noted sob with speech/ mild chest tightness better with saba but not eliminated (proair) so self referred 07/11/2014 to pulmonary clinic    History of Present Illness  07/11/2014 1st [facility] office visit/ Wert   Chief Complaint  Patient presents with  . Pulmonary Consult    Self referral. Pt c/o SOB with or without exertion for the past 2 months. Albuterol helps slightly- using albuterol inhaler a few times per day.   took liquid alb from children worked better [employer] on  07/09/14 before worked out  No noct symptoms Assoc with some coughing but no excess or purulent   mucus Allegra helped some  Worse with voice use which causes hoarseness  Also rec Please remember to go to the lab and x-ray department downstairs for your tests - we will call you with the results when they are available. GERD diet  Work on inhaler technique:  relax and gently blow all the way out then take a nice smooth deep breath back in, triggering the inhaler at same time you start breathing in.  Hold for up to 5 seconds if you can.  Rinse and gargle with water when done Try dulera 100 Take 2 puffs first thing in am and then another 2 puffs about 12 hours later.        08/23/2014 f/u ov/Wert re: mild persistent asthma likely atopic  Chief Complaint  Patient presents with  . Follow-up    f/u asthma; no complaints   rec Continue dulera 100 Take 2 puffs first thing in am and then another 2 puffs about 12 hours later.  Only use your albuterol  Not limited by breathing from desired  activities   rtc 6 m > did not do      08/07/2016  Acute extended ov/Wert re: mild asthma/ ? Atopic  Chief Complaint  Patient presents with  . Acute Visit    Pt c/o increased SOB for the past 2 wks- comes and goes and worse with talking. She has also had some tightness in her chest. She has not had to use her proair.   hadused dulera sporadically with good control of all previous asthma symtpoms  until 2 weeks prior to OV  With min assoc cough and did not have enough dulera to take the 100 2bid but when she did it helped  No problem sleep or with activity but no aerobics or steps     No obvious day to day or daytime variabilty or assoc  excess/ purulent sputum or mucus plugs   or cp or   subjective wheeze overt sinus or hb symptoms. No unusual exp hx or h/o childhood pna/ asthma or knowledge of premature birth.  Sleeping ok without nocturnal  or early am exacerbation  of respiratory  c/o's or need for noct saba. Also denies any obvious fluctuation of symptoms with weather or environmental changes or other aggravating or alleviating factors except as outlined above   Current Medications, Allergies, Complete Past Medical History, Past Surgical History, Family History, and  Social History were reviewed in [employer] record.  ROS  The following are not active complaints unless bolded sore throat, dysphagia, dental problems, itching, sneezing,  nasal congestion or excess/ purulent secretions, ear ache,   fever, chills, sweats, unintended wt loss, pleuritic or exertional cp, hemoptysis,  orthopnea pnd or leg swelling, presyncope, palpitations, heartburn, abdominal pain, anorexia, nausea, vomiting, diarrhea  or change in bowel or urinary habits, change in stools or urine, dysuria,hematuria,  rash, arthralgias, visual complaints, headache, numbness weakness or ataxia or problems with walking or coordination,  change in mood/affect or memory.          Objective:   Physical  Exam    Very pleasant amb bf  Vital signs reviewed - Note on arrival 02 sats  98% on RA    08/23/14            102 > 08/07/2016   94     07/25/14 100 lb (45.36 kg)  07/11/14 100 lb 6.4 oz (45.541 kg)  07/06/14 97 lb (43.999 kg)      HEENT: nl dentition, turbinates, and orophanx. Nl external ear canals without cough reflex   NECK :  without JVD/Nodes/TM/ nl carotid upstrokes bilaterally   LUNGS: no acc muscle use, clear to A and P bilaterally without cough on insp or exp maneuvers   CV:  RRR  no s3 or murmur or increase in P2, no edema   ABD:  soft and nontender with nl excursion in the supine position. No bruits or organomegaly, bowel sounds nl  MS:  warm without deformities, calf tenderness, cyanosis or clubbing        CXR PA and Lateral:   08/07/2016 :    I personally reviewed images and agree with radiology impression as follows:    The heart size and mediastinal contours are within normal limits. Both lungs are clear. The visualized skeletal structures are unremarkable.      Assessment & Plan:

## 2016-08-07 NOTE — Progress Notes (Signed)
Spoke with pt and notified of results per Dr. Wert. Pt verbalized understanding and denied any questions. 

## 2016-08-07 NOTE — Patient Instructions (Addendum)
Plan A = Automatic = dulera 100 Take  Up to 2 puffs first thing in am and then another 2 puffs about 12 hours later.      Plan B = Backup Only use your albuterol as a rescue medication to be used if you can't catch your breath by resting or doing a relaxed purse lip breathing pattern.  - The less you use it, the better it will work when you need it. - Ok to use the inhaler up to 2 puffs  every 4 hours if you must but call for appointment if use goes up over your usual need - Don't leave home without it !!  (think of it like the spare tire for your car)    Please remember to go to the  x-ray department downstairs for your tests - we will call you with the results when they are available.     If symptoms don't resolve on the dulera 100 2 every 12hours then you need additional evaluation

## 2016-08-07 NOTE — Assessment & Plan Note (Addendum)
--   Allergy profile 07/11/2014  IgE  475 esp cat/ dog grass - FENO 08/07/2016  =   13 - Spirometry 08/07/2016  FEV1 1.20 (51%)  Ratio 79 with min curvature p nothing prior to ov   -  08/07/2016 p extensive coaching HFA effectiveness =    95% > resume dulera 100  2bid x min of 2 weeks  Some of her symptoms are certainly atypical and do not support asthma (worse talking/ ok sleeping s cough) but note they did flare while trying to minimize dulera 100 "to make it last" so reasonable to first resume dulera 100 2bid and then proceed with further w/u if not better by the end of 2 weeks sample  If better on the other hand ok to resume the dulera prn as note her FENO does not suggests occult or unaddressed eos airways dz    I had an extended discussion with the patient reviewing all relevant studies completed to date and  lasting 25 minutes of a 40  minute acute  visit to re-establish with me  re  non-specific but potentially very serious pulmonary symptoms of unknown etiology.  Each maintenance medication was reviewed in detail including most importantly the difference between maintenance and prns and under what circumstances the prns are to be triggered using an action plan format that is not reflected in the computer generated alphabetically organized AVS.    Please see instructions for details which were reviewed in writing and the patient given a copy highlighting the part that I personally wrote and discussed at today's ov.

## 2016-08-19 DIAGNOSIS — M25512 Pain in left shoulder: Secondary | ICD-10-CM | POA: Diagnosis not present

## 2016-08-19 DIAGNOSIS — M545 Low back pain: Secondary | ICD-10-CM | POA: Diagnosis not present

## 2016-08-19 DIAGNOSIS — M542 Cervicalgia: Secondary | ICD-10-CM | POA: Diagnosis not present

## 2016-08-19 DIAGNOSIS — M25511 Pain in right shoulder: Secondary | ICD-10-CM | POA: Diagnosis not present

## 2016-08-21 ENCOUNTER — Telehealth: Payer: Self-pay | Admitting: Gastroenterology

## 2016-08-21 ENCOUNTER — Other Ambulatory Visit: Payer: Self-pay

## 2016-08-21 MED ORDER — BUDESONIDE 9 MG PO TB24: 1.0000 | ORAL_TABLET | Freq: Every day | ORAL | 1 refills | Status: DC

## 2016-08-21 NOTE — Telephone Encounter (Signed)
Thanks for the summary Almyra Free. Joint pains are usually not a common side effect of Lialda, although if she clearly thinks she is feeling worse since starting it, it's possible. We have a few options moving forward. If she doesn't feel well on Lialda or feels worse, we can try her on a course of Uceris 50m daily for 6 weeks to help her her colitis into remission. From there I will have to talk with her about other options if she does not think Lialda helps or it makes her worse. For this discussion it would be preferable to see her in clinic again to discuss all of these options, which include anti-TNF, thiopurines, sulfasalazine, etc. If she wishes to try Uceris 954mdaily, she can hold the Lialda. Tylenol is safe to take, she should avoid NSAIDs. Thanks

## 2016-08-21 NOTE — Telephone Encounter (Signed)
Left detailed message for patient re: Dr. Doyne Keel recommendations. Asked that she call back to let us know if she would like to try Uceris. Let her know that there are other options but Dr. Havery Moros would like to discuss those in the office.

## 2016-08-21 NOTE — Telephone Encounter (Signed)
Patient called back and would like to try the Uceris 9 mg daily for 6 weeks. Understands to hold the Lialda.

## 2016-08-21 NOTE — Telephone Encounter (Signed)
Spoke to patient, she did stop taking the Lialda for about 2 weeks. Restarted taking 2 tabs twice daily, instead of the 4 tabs twice daily. While off of the medications she still had some joint pain and stiffness. When restarted the Lialda the pain/stiffness increased.  She also is experiencing back pain/spasms, which she has had in the past. Her PCP had given her prednisone for this and patient does have some tablets left, but has not taken them as she aware of the risks associated with this medication without a doctors directions. She is taking Tylenol 1000 mg daily, states that she also has [facility] but will stop the Tylenol if she takes this. Patient is questioning whether back spasms are possible side effects of Lialda. Is questioning what pain medication she can safely take, and if she should do something different with the Lialda. Please advise.

## 2016-08-22 ENCOUNTER — Telehealth: Payer: Self-pay | Admitting: Gastroenterology

## 2016-08-23 NOTE — Telephone Encounter (Signed)
Spoke with opt. We do have a rebate card in the office. However, pt spoke with a representative at [facility] and they gave her a rebate card and also refunded the money that she had already spent on her Rx.

## 2016-08-26 ENCOUNTER — Ambulatory Visit (INDEPENDENT_AMBULATORY_CARE_PROVIDER_SITE_OTHER): Payer: Federal, State, Local not specified - PPO | Admitting: Family

## 2016-08-26 VITALS — BP 111/69 | HR 84 | Temp 98.4°F | Resp 16 | Ht 63.0 in | Wt 94.2 lb

## 2016-08-26 DIAGNOSIS — K5010 Crohn's disease of large intestine without complications: Secondary | ICD-10-CM | POA: Diagnosis not present

## 2016-08-26 MED ORDER — METHOCARBAMOL 500 MG PO TABS: 500.0000 mg | ORAL_TABLET | Freq: Every evening | ORAL | 1 refills | Status: DC | PRN

## 2016-08-26 NOTE — Patient Instructions (Signed)
Please schedule follow up with Dr. Estanislado Pandy. You will be contacted about your referral to GI at [facility] may begin robaxin at bedtime as needed for muscle spasms.

## 2016-08-26 NOTE — Progress Notes (Signed)
Pre visit review using our clinic review tool, if applicable. No additional management support is needed unless otherwise documented below in the visit note. 

## 2016-08-26 NOTE — Progress Notes (Signed)
Subjective:    Patient ID: Janice Zuniga, female    DOB: November 21, 1970, 45 y.o.   MRN: [id number]  HPI   Janice Zuniga is a 45 yr old female who presents today with chief complaint of low back pain.  She reports that neck and back pain have not been improving despite participation in PT.  (has been doing PT for several month).  She does have joint/muscle stiffness.    Saw GI who placed her on lialda and mesalamine for Chrohn's colitis.  She feels that thee medications seemed to make her back pain worse.  She is on Uceris for 5 days.  She does not yet feel any better.  She is taking 2000 mg/day of tylenol in order to be able to function.   She reports that she has nightly muscle spasms Review of Systems    see HPI  Past Medical History:  Diagnosis Date  . Abnormal CT of the abdomen   . Abnormal uterine bleeding   . Anemia    iron def  . Chicken pox   . Colitis   . Dysrhythmia    occassional fluttering noted by patient  . Genital warts   . Gluten intolerance   . History of chlamydia   . History of hysterectomy 08/21/15  . HLA B27 (HLA B27 positive)   . HSV-1 (herpes simplex virus 1) infection   . Low calcium levels   . Low iron   . Low serum vitamin D   . Muscle spasms of both lower extremities    Spasms in lower back  . Non-celiac gluten sensitivity   . Reactive airway disease   . 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 UTI (lower urinary tract infection)      Social History   Social History  . Marital status: Married    Spouse name: N/A  . Number of children: N/A  . Years of education: N/A   Occupational History  . Not on file.   Social History Main Topics  . Smoking status: Never Smoker  . Smokeless tobacco: Never Used  . Alcohol use 1.2 oz/week    2 Standard drinks or equivalent per week  . Drug use: No  . Sexual activity: Yes    Partners: Male     Comment: Hyst   Other Topics Concern  . 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 3 kids. Janice Zuniga '95. Janice 03' Zuniga 05'.    Got 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 ABDOMINAL HYSTERECTOMY  08/21/15  . BILATERAL SALPINGECTOMY Bilateral 08/21/2015   Procedure: BILATERAL SALPINGECTOMY;  Surgeon: Nunzio Cobbs, MD;  Location: [facility];  Service: Gynecology;  Laterality: Bilateral;  . COLONOSCOPY    . CYSTOSCOPY N/A 08/21/2015   Procedure: CYSTOSCOPY;  Surgeon: Nunzio Cobbs, MD;  Location: [facility];  Service: Gynecology;  Laterality: N/A;  . MOUTH SURGERY     cyst in mouth  . ROBOTIC ASSISTED TOTAL HYSTERECTOMY WITH SALPINGECTOMY Bilateral 08/21/2015   Procedure: ROBOTIC ASSISTED TOTAL HYSTERECTOMY ;  Surgeon: Nunzio Cobbs, MD;  Location: [facility];  Service: Gynecology;  Laterality: Bilateral;  . WISDOM  TOOTH EXTRACTION      Family History  Problem Relation Age of Onset  . Hypertension Mother   . Diabetes Mother     ? father  . Cancer Paternal Grandmother     ? type  . Diabetes Father   . Colon polyps Father   . Prostate cancer Father     Allergies  Allergen Reactions  . Gluten Meal Other (See Comments)    Auto immune  . Tramadol     nausea  . Codeine Nausea And Vomiting  . Peanut-Containing Drug Products Other (See Comments)    By allergy test...has eaten peanuts without issues     Current Outpatient Prescriptions on File Prior to Visit  Medication Sig Dispense Refill  . ALOE PO Take 1 Dose by mouth 2 (two) times daily. George's Active Aloe    . AMBULATORY NON FORMULARY MEDICATION Thyroplex for Women Take 1 tablet by mouth once daiy    . Budesonide (UCERIS) 9 MG TB24 Take 1 tablet by mouth daily. 30 tablet 1  . Cholecalciferol (VITAMIN D PO) Take 6,000 Units by mouth 2 (two) times daily.      . Cyanocobalamin (VITAMIN B12 PO) Take 1 tablet by mouth daily. B6 and Folic acid    . mometasone-formoterol (DULERA) 100-5 MCG/ACT AERO Inhale 2 puffs into the lungs 2 (two) times daily. 1 Inhaler 11  . Multiple Vitamin (MULTIVITAMIN) tablet Take 1 tablet by mouth 2 (two) times daily. Nutricology multivitamin    . PROAIR HFA 108 (90 BASE) MCG/ACT inhaler Inhale 2 puffs into the lungs every 4 (four) hours as needed. Reported on 03/29/2016    . Probiotic Product (PROBIOTIC PO) Take 1 tablet by mouth at bedtime. Natren     Current Facility-Administered Medications on File Prior to Visit  Medication Dose Route Frequency Provider Last Rate Last Dose  . 0.9 %  sodium chloride infusion  500 mL Intravenous Continuous Manus Gunning, MD        BP 111/69 (BP Location: Right Arm, Cuff Size: Normal)   Pulse 84   Temp 98.4 F (36.9 C) (Oral)   Resp 16   Ht 5' 3"  (1.6 m)   Wt 94 lb 3.2 oz (42.7 kg)   LMP 08/07/2015   SpO2 100% Comment: room air  BMI 16.69 kg/m    Objective:   Physical Exam  Constitutional: She is oriented to person, place, and time. She appears well-developed.  Thin AA female, NAD  Cardiovascular: Normal rate, regular rhythm and normal heart sounds.   No murmur heard. Pulmonary/Chest: Effort normal and breath sounds normal. No respiratory distress. She has no wheezes.  Abdominal: Soft. Bowel sounds are normal.  Neurological: She is alert and oriented to person, place, and time.  Psychiatric: She has a normal mood and affect. Her behavior is normal. Judgment and thought content normal.          Assessment & Plan:   Chrohn's colitis- Patient desires a referral to GI for a second opinion. She has seen rheumatology as well for her arthralgias. She reports that her rheumatology work up was unrevealing and Dr. Estanislado Pandy referred her to PT.  I have advised pt to arrange follow up with Dr. Estanislado Pandy since her arthralgias have not improved. We did discuss that the  arthralgias can be related to her chrohn's disease.  I did give her an Rx for robaxin HS PRN.  25 minutes spent with pt today. >50% of this time was spent counseling pt about chrohn's symptoms/treatment.  She does report that she continues to work with the therapist and is finding this helpful.

## 2016-08-27 ENCOUNTER — Ambulatory Visit: Payer: Federal, State, Local not specified - PPO | Admitting: Psychology

## 2016-08-28 ENCOUNTER — Telehealth: Payer: Self-pay | Admitting: Family

## 2016-08-28 NOTE — Telephone Encounter (Signed)
Pt dropped off document to be filled out by PCP (Family and Medical Leave Act- 5 pages) Janice Zuniga. Pt would like to be called when document ready to pick up at [facility] [phone]. Document put at front office tray.

## 2016-08-30 ENCOUNTER — Encounter: Payer: Self-pay | Admitting: Family

## 2016-09-02 NOTE — Telephone Encounter (Signed)
Melissa--forms are in your red folder for completion. Please advise when complete.

## 2016-09-02 NOTE — Telephone Encounter (Signed)
Patient is calling to find out the status of her FMLA paperwork. Please advise   Patient phone: [phone]

## 2016-09-02 NOTE — Telephone Encounter (Signed)
Form completed.

## 2016-09-02 NOTE — Telephone Encounter (Signed)
Originals placed at front desk for pick up and notified pt. Copy sent for processing / scanning.

## 2016-09-03 ENCOUNTER — Other Ambulatory Visit: Payer: Self-pay

## 2016-09-03 ENCOUNTER — Telehealth: Payer: Self-pay | Admitting: Family

## 2016-09-03 DIAGNOSIS — R634 Abnormal weight loss: Secondary | ICD-10-CM

## 2016-09-03 NOTE — Telephone Encounter (Signed)
Notified pt and scheduled lab appt for tomorrow at 9:30am. Future lab orders entered.

## 2016-09-03 NOTE — Telephone Encounter (Signed)
See message please

## 2016-09-03 NOTE — Telephone Encounter (Signed)
Anderson Malta, could you please send GI referral to Dr. Emelda Fear as noted below instead?  Thanks.

## 2016-09-03 NOTE — Telephone Encounter (Signed)
Relation to pt: self Call back number:[phone]   Reason for call:  Patient requesting a referral to gastro Dr. Wyatt Portela. [place] MD [address], [address], [place], [place] [address] Phone: [phone] fax # [phone] and endocrinology Dr. Meyer Russel MD [place], [place], [place] [address] fax # [phone], please advise

## 2016-09-03 NOTE — Addendum Note (Signed)
Addended by: Kelle Darting A on: 09/03/2016 03:52 PM   Modules accepted: Orders

## 2016-09-03 NOTE — Telephone Encounter (Signed)
Please let pt know that I received her message re: endocrinology referral.  Lets check her thyroid studies first and if abnormal will send to endocrine at that time. They like the work up to be completed prior to referral. See orders.

## 2016-09-04 ENCOUNTER — Other Ambulatory Visit (INDEPENDENT_AMBULATORY_CARE_PROVIDER_SITE_OTHER): Payer: Federal, State, Local not specified - PPO

## 2016-09-04 ENCOUNTER — Encounter: Payer: Self-pay | Admitting: Family

## 2016-09-04 DIAGNOSIS — R634 Abnormal weight loss: Secondary | ICD-10-CM | POA: Diagnosis not present

## 2016-09-04 LAB — T4, FREE: Free T4: 1.41 ng/dL (ref 0.60–1.60)

## 2016-09-04 LAB — TSH: TSH: 0.81 u[IU]/mL (ref 0.35–4.50)

## 2016-09-04 LAB — T3, FREE: T3, Free: 2.7 pg/mL (ref 2.3–4.2)

## 2016-09-05 ENCOUNTER — Ambulatory Visit: Payer: Self-pay | Admitting: Gastroenterology

## 2016-09-05 ENCOUNTER — Telehealth: Payer: Self-pay

## 2016-09-05 NOTE — Telephone Encounter (Signed)
Faxed referral to [facility] for 2nd opinion. Called patient and gave her their phone number to call if she has not heard back from them in one week.

## 2016-09-10 ENCOUNTER — Telehealth: Payer: Self-pay | Admitting: Gastroenterology

## 2016-09-10 ENCOUNTER — Ambulatory Visit (INDEPENDENT_AMBULATORY_CARE_PROVIDER_SITE_OTHER): Payer: Federal, State, Local not specified - PPO | Admitting: Psychology

## 2016-09-10 ENCOUNTER — Telehealth: Payer: Self-pay

## 2016-09-10 DIAGNOSIS — F4323 Adjustment disorder with mixed anxiety and depressed mood: Secondary | ICD-10-CM

## 2016-09-10 NOTE — Telephone Encounter (Signed)
[facility] referral faxed with office notes, labs and imaging results for a 2nd opinion.

## 2016-09-10 NOTE — Telephone Encounter (Signed)
Pt informed. Instructed that if she has not heard from our office or [facility] by Friday at lunch to give [place] a call to check on referral status. Pt understood.

## 2016-09-10 NOTE — Telephone Encounter (Signed)
Caryl Pina have you faxed the second page of referral back to [facility]. If so can you check on this please. Thanks so much.

## 2016-09-10 NOTE — Telephone Encounter (Signed)
No, I have not been able to do it yet. Do you have time?

## 2016-09-12 DIAGNOSIS — K5090 Crohn's disease, unspecified, without complications: Secondary | ICD-10-CM | POA: Diagnosis not present

## 2016-09-12 DIAGNOSIS — M791 Myalgia: Secondary | ICD-10-CM | POA: Diagnosis not present

## 2016-09-12 NOTE — Telephone Encounter (Signed)
Spoke with Cleotis Lema at [facility]. Pts appt has not been scheduled at this time. She is on the call list to be scheduled however. Will call back to check status.

## 2016-09-14 DIAGNOSIS — M791 Myalgia: Secondary | ICD-10-CM | POA: Diagnosis not present

## 2016-09-14 DIAGNOSIS — K5090 Crohn's disease, unspecified, without complications: Secondary | ICD-10-CM | POA: Diagnosis not present

## 2016-09-14 DIAGNOSIS — G4700 Insomnia, unspecified: Secondary | ICD-10-CM | POA: Diagnosis not present

## 2016-09-18 NOTE — Telephone Encounter (Signed)
Spoke with Jeanette Caprice at [facility] and she states that pt is still on the call list and she could not give me a time frame as to when pt would be scheduled. Jeanette Caprice suggested that pt call. I left message on vmail for pt informing her and gave the number of the [place] office.[phone].

## 2016-09-24 ENCOUNTER — Ambulatory Visit (INDEPENDENT_AMBULATORY_CARE_PROVIDER_SITE_OTHER): Payer: Federal, State, Local not specified - PPO | Admitting: Psychology

## 2016-09-24 DIAGNOSIS — F4323 Adjustment disorder with mixed anxiety and depressed mood: Secondary | ICD-10-CM

## 2016-09-25 ENCOUNTER — Encounter: Payer: Self-pay | Admitting: Physician Assistant

## 2016-09-25 ENCOUNTER — Ambulatory Visit (INDEPENDENT_AMBULATORY_CARE_PROVIDER_SITE_OTHER): Payer: Federal, State, Local not specified - PPO | Admitting: Physician Assistant

## 2016-09-25 VITALS — BP 102/65 | HR 85 | Temp 98.5°F | Resp 16 | Ht 63.0 in | Wt 94.0 lb

## 2016-09-25 DIAGNOSIS — R300 Dysuria: Secondary | ICD-10-CM

## 2016-09-25 LAB — POC URINALSYSI DIPSTICK (AUTOMATED)
Bilirubin, UA: NEGATIVE
Blood, UA: NEGATIVE
Glucose, UA: NEGATIVE
KETONES UA: NEGATIVE
Leukocytes, UA: NEGATIVE
Nitrite, UA: NEGATIVE
PROTEIN UA: NEGATIVE
SPEC GRAV UA: 1.025
UROBILINOGEN UA: NEGATIVE
pH, UA: 6

## 2016-09-25 NOTE — Progress Notes (Signed)
Subjective:    Patient ID: Janice Zuniga, female    DOB: [birth year], 46 y.o.   MRN: [id number]  HPI  Patient reports a sensation of bladder pressure that started Monday. This is her only symptom, denies any new or unusual vaginal discharge, back pain, chills, nausea, changes in stools, blood in urine, frequency, burning. The "pressure" is relieved when she urinates. Yesterday she reports that her symptoms were almost completely resolved, she drank some baking soda with water to "help with her pH." Her symptoms returned this morning which is why she is here today.  She does have a hx of UTI's, most recently last year she thinks. She does not get them often. Denies any history of kidney infections. Does have a history of hysterectomy.  She also notes that she stopped taking Budesonide last Tuesday, was unable to get a refill secondary to cost, she is supposed to be on this medication for about 2 more weeks, per her report. She also states that the holistic clinic has started her on Borage Oil, a probiotic and Curcumin - have been taking for about 1 week ago. She is unsure if these medications are causing this "bloating" sensation.   Review of Systems  See HPI.  Past Medical History:  Diagnosis Date  . Abnormal CT of the abdomen   . Abnormal uterine bleeding   . Anemia    iron def  . Chicken pox   . Colitis   . Dysrhythmia    occassional fluttering noted by patient  . Genital warts   . Gluten intolerance   . History of chlamydia   . History of hysterectomy 08/21/15  . HLA B27 (HLA B27 positive)   . HSV-1 (herpes simplex virus 1) infection   . Low calcium levels   . Low iron   . Low serum vitamin D   . Muscle spasms of both lower extremities    Spasms in lower back  . Non-celiac gluten sensitivity   . Reactive airway disease   . 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 UTI (lower urinary tract infection)      Social History   Social History  . Marital status:  Married    Spouse name: N/A  . Number of children: N/A  . Years of education: N/A   Occupational History  . Not on file.   Social History Main Topics  . Smoking status: Never Smoker  . Smokeless tobacco: Never Used  . Alcohol use 1.2 oz/week    2 Standard drinks or equivalent per week  . Drug use: No  . Sexual activity: Yes    Partners: Male     Comment: Hyst   Other Topics Concern  . 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 [name]. [name].    Got B[employer] 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 ABDOMINAL HYSTERECTOMY  08/21/15  . BILATERAL SALPINGECTOMY Bilateral 08/21/2015   Procedure: BILATERAL SALPINGECTOMY;  Surgeon: Nunzio Cobbs, MD;  Location: [facility];  Service: Gynecology;  Laterality: Bilateral;  . COLONOSCOPY    . CYSTOSCOPY N/A 08/21/2015   Procedure: CYSTOSCOPY;  Surgeon: Vicenta Dunning  Quincy Simmonds, MD;  Location: [facility];  Service: Gynecology;  Laterality: N/A;  . MOUTH SURGERY     cyst in mouth  . ROBOTIC ASSISTED TOTAL HYSTERECTOMY WITH SALPINGECTOMY Bilateral 08/21/2015   Procedure: ROBOTIC ASSISTED TOTAL HYSTERECTOMY ;  Surgeon: Nunzio Cobbs, MD;  Location: [facility];  Service: Gynecology;  Laterality: Bilateral;  . WISDOM TOOTH EXTRACTION      Family History  Problem Relation Age of Onset  . Hypertension Mother   . Diabetes Mother     ? father  . Cancer Paternal Grandmother     ? type  . Diabetes Father   . Colon polyps Father   . Prostate cancer Father     Allergies  Allergen Reactions  . Gluten Meal Other (See Comments)    Auto immune  . Tramadol     nausea  . Codeine Nausea And Vomiting  . Peanut-Containing Drug Products Other (See Comments)    By allergy  test...has eaten peanuts without issues     Current Outpatient Prescriptions on File Prior to Visit  Medication Sig Dispense Refill  . acetaminophen (TYLENOL) 500 MG tablet Take 1,000 mg by mouth 2 (two) times daily as needed.    . mometasone-formoterol (DULERA) 100-5 MCG/ACT AERO Inhale 2 puffs into the lungs 2 (two) times daily. 1 Inhaler 11  . PROAIR HFA 108 (90 BASE) MCG/ACT inhaler Inhale 2 puffs into the lungs every 4 (four) hours as needed. Reported on 03/29/2016    . Probiotic Product (PROBIOTIC PO) Take 1 tablet by mouth at bedtime. Natren     Current Facility-Administered Medications on File Prior to Visit  Medication Dose Route Frequency Provider Last Rate Last Dose  . 0.9 %  sodium chloride infusion  500 mL Intravenous Continuous Manus Gunning, MD        BP 102/65 (BP Location: Left Arm, Cuff Size: Normal)   Pulse 85   Temp 98.5 F (36.9 C) (Oral)   Resp 16   Ht 5' 3"  (1.6 m)   Wt 94 lb (42.6 kg)   LMP 08/07/2015   SpO2 100% Comment: room air  BMI 16.65 kg/m        Objective:   Physical Exam  Constitutional: Vital signs are normal. She appears well-developed and well-nourished.  Eyes: Conjunctivae are normal.  Cardiovascular: Normal rate and regular rhythm.   Pulmonary/Chest: Effort normal and breath sounds normal.  Abdominal: Soft. Normal appearance. There is no tenderness (no suprapubic tenderness noted). There is no CVA tenderness.  Neurological: She is alert.  Nursing note and vitals reviewed.  Results for orders placed or performed in visit on 09/25/16  POCT Urinalysis Dipstick (Automated)  Result Value Ref Range   Color, UA yellow    Clarity, UA clear    Glucose, UA neg    Bilirubin, UA neg    Ketones, UA neg    Spec Grav, UA 1.025    Blood, UA neg    pH, UA 6.0    Protein, UA neg    Urobilinogen, UA negative    Nitrite, UA neg    Leukocytes, UA Negative Negative        Assessment & Plan:  Dysuria - UA dipstick without acute  abnormalities. Will send for urine culture. Encouraged continued hydration.  Patient was advised to follow-up with PCP if symptoms persist or new symptoms develop.  Inda Coke PA-C

## 2016-09-25 NOTE — Patient Instructions (Signed)
It was great meeting you today!  We will send your urine out for culture to see if any bacteria grows, we will let you know about your results when we get them. Continue to stay hydrated. Follow-up if your symptoms do not improve or new/worsening symptoms.

## 2016-09-25 NOTE — Progress Notes (Signed)
Pre visit review using our clinic review tool, if applicable. No additional management support is needed unless otherwise documented below in the visit note. 

## 2016-09-26 LAB — URINE CULTURE: Organism ID, Bacteria: NO GROWTH

## 2016-09-27 ENCOUNTER — Telehealth: Payer: Self-pay | Admitting: *Deleted

## 2016-09-27 NOTE — Telephone Encounter (Signed)
Pt states she had an appt with NP, Inda Castle on Monday, 09/30/16 and needs to cancel as she is seeing a Holistic doctor at [facility] and will only follow up with [place] as needed. Appt cancelled.

## 2016-09-30 ENCOUNTER — Ambulatory Visit: Payer: Self-pay | Admitting: Family

## 2016-10-07 ENCOUNTER — Ambulatory Visit (INDEPENDENT_AMBULATORY_CARE_PROVIDER_SITE_OTHER): Payer: Federal, State, Local not specified - PPO | Admitting: Psychology

## 2016-10-07 DIAGNOSIS — F4323 Adjustment disorder with mixed anxiety and depressed mood: Secondary | ICD-10-CM | POA: Diagnosis not present

## 2016-10-08 DIAGNOSIS — M542 Cervicalgia: Secondary | ICD-10-CM | POA: Diagnosis not present

## 2016-10-12 DIAGNOSIS — M542 Cervicalgia: Secondary | ICD-10-CM | POA: Diagnosis not present

## 2016-10-12 DIAGNOSIS — M545 Low back pain: Secondary | ICD-10-CM | POA: Diagnosis not present

## 2016-10-14 ENCOUNTER — Ambulatory Visit (HOSPITAL_BASED_OUTPATIENT_CLINIC_OR_DEPARTMENT_OTHER): Payer: Federal, State, Local not specified - PPO | Admitting: Hematology & Oncology

## 2016-10-14 ENCOUNTER — Other Ambulatory Visit (HOSPITAL_BASED_OUTPATIENT_CLINIC_OR_DEPARTMENT_OTHER): Payer: Federal, State, Local not specified - PPO

## 2016-10-14 VITALS — BP 100/69 | HR 90 | Temp 98.4°F | Resp 16 | Wt 91.0 lb

## 2016-10-14 DIAGNOSIS — R771 Abnormality of globulin: Secondary | ICD-10-CM | POA: Diagnosis not present

## 2016-10-14 DIAGNOSIS — M791 Myalgia: Secondary | ICD-10-CM | POA: Diagnosis not present

## 2016-10-14 DIAGNOSIS — J4520 Mild intermittent asthma, uncomplicated: Secondary | ICD-10-CM | POA: Diagnosis not present

## 2016-10-14 DIAGNOSIS — K5090 Crohn's disease, unspecified, without complications: Secondary | ICD-10-CM | POA: Diagnosis not present

## 2016-10-14 DIAGNOSIS — D509 Iron deficiency anemia, unspecified: Secondary | ICD-10-CM

## 2016-10-14 DIAGNOSIS — D500 Iron deficiency anemia secondary to blood loss (chronic): Secondary | ICD-10-CM | POA: Diagnosis not present

## 2016-10-14 DIAGNOSIS — D508 Other iron deficiency anemias: Secondary | ICD-10-CM

## 2016-10-14 LAB — CMP (CANCER CENTER ONLY)
ALBUMIN: 3.1 g/dL — AB (ref 3.3–5.5)
ALT(SGPT): 25 U/L (ref 10–47)
AST: 18 U/L (ref 11–38)
Alkaline Phosphatase: 73 U/L (ref 26–84)
BUN, Bld: 10 mg/dL (ref 7–22)
CHLORIDE: 102 meq/L (ref 98–108)
CO2: 28 meq/L (ref 18–33)
Calcium: 8.9 mg/dL (ref 8.0–10.3)
Creat: 0.8 mg/dl (ref 0.6–1.2)
Glucose, Bld: 85 mg/dL (ref 73–118)
Potassium: 3.8 mEq/L (ref 3.3–4.7)
Sodium: 135 mEq/L (ref 128–145)
TOTAL PROTEIN: 7.3 g/dL (ref 6.4–8.1)
Total Bilirubin: 0.7 mg/dl (ref 0.20–1.60)

## 2016-10-14 LAB — CBC WITH DIFFERENTIAL (CANCER CENTER ONLY)
BASO#: 0 10*3/uL (ref 0.0–0.2)
BASO%: 0.2 % (ref 0.0–2.0)
EOS ABS: 0.1 10*3/uL (ref 0.0–0.5)
EOS%: 0.8 % (ref 0.0–7.0)
HCT: 36.6 % (ref 34.8–46.6)
HEMOGLOBIN: 12.5 g/dL (ref 11.6–15.9)
LYMPH#: 2.4 10*3/uL (ref 0.9–3.3)
LYMPH%: 21.7 % (ref 14.0–48.0)
MCH: 28.7 pg (ref 26.0–34.0)
MCHC: 34.2 g/dL (ref 32.0–36.0)
MCV: 84 fL (ref 81–101)
MONO#: 0.6 10*3/uL (ref 0.1–0.9)
MONO%: 5.4 % (ref 0.0–13.0)
NEUT%: 71.9 % (ref 39.6–80.0)
NEUTROS ABS: 7.8 10*3/uL — AB (ref 1.5–6.5)
Platelets: 412 10*3/uL — ABNORMAL HIGH (ref 145–400)
RBC: 4.35 10*6/uL (ref 3.70–5.32)
RDW: 13.3 % (ref 11.1–15.7)
WBC: 10.9 10*3/uL — AB (ref 3.9–10.0)

## 2016-10-15 LAB — IGG, IGA, IGM
IgA, Qn, Serum: 404 mg/dL — ABNORMAL HIGH (ref 87–352)
IgG, Qn, Serum: 1402 mg/dL (ref 700–1600)
IgM, Qn, Serum: 227 mg/dL — ABNORMAL HIGH (ref 26–217)

## 2016-10-15 LAB — IRON AND TIBC
%SAT: 16 % — ABNORMAL LOW (ref 21–57)
IRON: 28 ug/dL — AB (ref 41–142)
TIBC: 173 ug/dL — ABNORMAL LOW (ref 236–444)
UIBC: 145 ug/dL (ref 120–384)

## 2016-10-15 LAB — FERRITIN: Ferritin: 1853 ng/ml — ABNORMAL HIGH (ref 9–269)

## 2016-10-15 LAB — RETICULOCYTES: RETICULOCYTE COUNT: 1.2 % (ref 0.6–2.6)

## 2016-10-16 ENCOUNTER — Telehealth: Payer: Self-pay | Admitting: *Deleted

## 2016-10-16 LAB — KAPPA/LAMBDA LIGHT CHAINS
IG LAMBDA FREE LIGHT CHAIN: 18.6 mg/L (ref 5.7–26.3)
Ig Kappa Free Light Chain: 23.6 mg/L — ABNORMAL HIGH (ref 3.3–19.4)
Kappa/Lambda FluidC Ratio: 1.27 (ref 0.26–1.65)

## 2016-10-16 LAB — PROTEIN ELECTROPHORESIS, SERUM
A/G Ratio: 0.8 (ref 0.7–1.7)
Albumin: 3 g/dL (ref 2.9–4.4)
Alpha 1: 0.3 g/dL (ref 0.0–0.4)
Alpha 2: 0.8 g/dL (ref 0.4–1.0)
Beta: 1.1 g/dL (ref 0.7–1.3)
GLOBULIN, TOTAL: 3.8 g/dL (ref 2.2–3.9)
Gamma Globulin: 1.5 g/dL (ref 0.4–1.8)
TOTAL PROTEIN: 6.8 g/dL (ref 6.0–8.5)

## 2016-10-16 NOTE — Telephone Encounter (Addendum)
Patient aware of results. Appointment scheduled  ----- Message from [name], NP sent at 10/15/2016  4:32 PM EST ----- Regarding: iron  Needs one dose of IV iron this week please. Thank you!  Sarah  ----- Message ----- From: Interface, Lab In Three Zero One Sent: 10/14/2016   3:19 PM To: [name], NP

## 2016-10-21 DIAGNOSIS — G8929 Other chronic pain: Secondary | ICD-10-CM | POA: Diagnosis not present

## 2016-10-21 DIAGNOSIS — M545 Low back pain: Secondary | ICD-10-CM | POA: Diagnosis not present

## 2016-10-21 NOTE — Progress Notes (Signed)
Hematology and Oncology Follow Up Visit  Janice Zuniga [id number] [birth year] 46 y.o. 10/21/2016   Principle Diagnosis:  1. Elevated serum globulin level 2. Iron deficiency anemia 3. HLA B27 positive and spondyloarthritis   Current Therapy:   IV iron as indicated     Interim History:  Janice Zuniga is here today for a follow-up. She is doing fairly well.  She had an MRI back in October. This showed no obvious active inflammatory bowel disease. She had some evidence of hemosiderosis. I would have to suspect this is probably is from iron infusions.  Back in October, her ferritin was 2000 with iron saturation of only 20%. Her serum iron was 34. She had a markedly elevated C-reactive protein in November of 42.6.  She last got iron back in August.  I spoke to her doctor at [facility] earlier this week. He thinks that she actually may have too much iron. I'm not sure how this could be given that her serum iron is only 28. Her iron saturation is only 16.  Today, her ferritin is 1800.  Her protein studies do not show an M spike. She has a mildly elevated IgM level of 227 mg/dL.   She's had no fever. There's been no obvious bleeding. She's had no vomiting. Some occasional nausea.  She's had no cough. There's been no fever. She's had no leg swelling. There's been no rashes.  Overall, her performance status is ECOG 1.     Medications:  Allergies as of 10/14/2016      Reactions   Gluten Meal Other (See Comments)   Auto immune   Tramadol    nausea   Codeine Nausea And Vomiting   Peanut-containing Drug Products Other (See Comments)   By allergy test...has eaten peanuts without issues       Medication List       Accurate as of 10/14/16 11:59 PM. Always use your most recent med list.          acetaminophen 500 MG tablet Commonly known as:  TYLENOL Take 1,000 mg by mouth 2 (two) times daily as needed.   mometasone-formoterol 100-5 MCG/ACT Aero Commonly known as:  DULERA Inhale  2 puffs into the lungs 2 (two) times daily.   PROAIR HFA 108 (90 Base) MCG/ACT inhaler Generic drug:  albuterol Inhale 2 puffs into the lungs every 4 (four) hours as needed. Reported on 03/29/2016   PROBIOTIC PO Take 1 tablet by mouth at bedtime. Natren       Allergies:  Allergies  Allergen Reactions  . Gluten Meal Other (See Comments)    Auto immune  . Tramadol     nausea  . Codeine Nausea And Vomiting  . Peanut-Containing Drug Products Other (See Comments)    By allergy test...has eaten peanuts without issues     Past Medical History, Surgical history, Social history, and Family History were reviewed and updated.  Review of Systems: All other 10 point review of systems is negative.   Physical Exam:  weight is 91 lb (41.3 kg). Her oral temperature is 98.4 F (36.9 C). Her blood pressure is 100/69 and her pulse is 90. Her respiration is 16 and oxygen saturation is 100%.   Wt Readings from Last 3 Encounters:  10/14/16 91 lb (41.3 kg)  09/25/16 94 lb (42.6 kg)  08/26/16 94 lb 3.2 oz (42.7 kg)    Petite African-American female in no obvious distress. Head and neck exam shows no ocular or oral lesions. There are  no palpable cervical or supraclavicular lymph nodes. Lungs are clear. Cardiac exam regular rate and rhythm with no murmurs, rubs or bruits. Abdomen is soft. She has good bowel sounds. There is no fluid wave. There is no palpable liver or spleen tip. Extremities shows no clubbing, cyanosis or edema. She has good range of motion of her joints. Skin exam shows no rashes, ecchymoses or petechia. Neurological exam shows no focal neurological deficits.   Lab Results  Component Value Date   WBC 10.9 (H) 10/14/2016   HGB 12.5 10/14/2016   HCT 36.6 10/14/2016   MCV 84 10/14/2016   PLT 412 (H) 10/14/2016   Lab Results  Component Value Date   FERRITIN 1,853 (H) 10/14/2016   IRON 28 (L) 10/14/2016   TIBC 173 (L) 10/14/2016   UIBC 145 10/14/2016   IRONPCTSAT 16 (L)  10/14/2016   Lab Results  Component Value Date   RBC 4.35 10/14/2016   Lab Results  Component Value Date   KPAFRELGTCHN 2.13 (H) 07/31/2015   LAMBDASER 1.58 07/31/2015   KAPLAMBRATIO 1.27 10/14/2016   Lab Results  Component Value Date   IGGSERUM 1,402 10/14/2016   IGA 359 07/31/2015   IGMSERUM 227 (H) 10/14/2016   Lab Results  Component Value Date   TOTALPROTELP 6.8 07/31/2015   ALBUMINELP 3.1 (L) 07/31/2015   A1GS 0.4 (H) 07/31/2015   A2GS 0.8 07/31/2015   BETS 0.5 07/31/2015   BETA2SER 0.5 07/31/2015   GAMS 1.5 07/31/2015   MSPIKE Not Observed 10/14/2016   SPEI * 07/31/2015     Chemistry      Component Value Date/Time   NA 135 10/14/2016 1506   K 3.8 10/14/2016 1506   CL 102 10/14/2016 1506   CO2 28 10/14/2016 1506   BUN 10 10/14/2016 1506   CREATININE 0.8 10/14/2016 1506      Component Value Date/Time   CALCIUM 8.9 10/14/2016 1506   ALKPHOS 73 10/14/2016 1506   AST 18 10/14/2016 1506   ALT 25 10/14/2016 1506   BILITOT 0.70 10/14/2016 1506     Impression and Plan: Janice Zuniga is a very pleasant 46 yo African American female with history of iron deficiency anemia.   For right now, I think we will have to hold on giving her any iron. She certainly is not anemic. I think we can just follow her along.  We will have to see how her iron studies go. Her LFTs are not elevated.  I suppose that the MRI that she had done back in October could possibly reflect inflammatory changes in the liver.  I will actually send off a erythropoietin level on her.  I would like to see her back in 2 months.    Volanda Napoleon, MD 1/29/20187:01 AM

## 2016-10-22 ENCOUNTER — Ambulatory Visit (INDEPENDENT_AMBULATORY_CARE_PROVIDER_SITE_OTHER): Payer: Federal, State, Local not specified - PPO | Admitting: Psychology

## 2016-10-22 ENCOUNTER — Ambulatory Visit: Payer: Self-pay

## 2016-10-22 DIAGNOSIS — F4323 Adjustment disorder with mixed anxiety and depressed mood: Secondary | ICD-10-CM | POA: Diagnosis not present

## 2016-10-23 DIAGNOSIS — R269 Unspecified abnormalities of gait and mobility: Secondary | ICD-10-CM | POA: Diagnosis not present

## 2016-10-23 DIAGNOSIS — M545 Low back pain: Secondary | ICD-10-CM | POA: Diagnosis not present

## 2016-10-23 DIAGNOSIS — M542 Cervicalgia: Secondary | ICD-10-CM | POA: Diagnosis not present

## 2016-10-29 DIAGNOSIS — R269 Unspecified abnormalities of gait and mobility: Secondary | ICD-10-CM | POA: Diagnosis not present

## 2016-10-29 DIAGNOSIS — M545 Low back pain: Secondary | ICD-10-CM | POA: Diagnosis not present

## 2016-10-29 DIAGNOSIS — M542 Cervicalgia: Secondary | ICD-10-CM | POA: Diagnosis not present

## 2016-10-30 ENCOUNTER — Ambulatory Visit (INDEPENDENT_AMBULATORY_CARE_PROVIDER_SITE_OTHER): Payer: Federal, State, Local not specified - PPO | Admitting: Psychology

## 2016-10-30 DIAGNOSIS — F4323 Adjustment disorder with mixed anxiety and depressed mood: Secondary | ICD-10-CM

## 2016-11-01 DIAGNOSIS — R7989 Other specified abnormal findings of blood chemistry: Secondary | ICD-10-CM | POA: Diagnosis not present

## 2016-11-01 DIAGNOSIS — K50819 Crohn's disease of both small and large intestine with unspecified complications: Secondary | ICD-10-CM | POA: Insufficient documentation

## 2016-11-01 DIAGNOSIS — M545 Low back pain: Secondary | ICD-10-CM | POA: Diagnosis not present

## 2016-11-01 DIAGNOSIS — M542 Cervicalgia: Secondary | ICD-10-CM | POA: Diagnosis not present

## 2016-11-01 DIAGNOSIS — M0760 Enteropathic arthropathies, unspecified site: Secondary | ICD-10-CM | POA: Diagnosis not present

## 2016-11-01 DIAGNOSIS — M129 Arthropathy, unspecified: Secondary | ICD-10-CM | POA: Diagnosis not present

## 2016-11-01 DIAGNOSIS — R269 Unspecified abnormalities of gait and mobility: Secondary | ICD-10-CM | POA: Diagnosis not present

## 2016-11-02 DIAGNOSIS — R634 Abnormal weight loss: Secondary | ICD-10-CM | POA: Insufficient documentation

## 2016-11-04 ENCOUNTER — Telehealth: Payer: Self-pay | Admitting: *Deleted

## 2016-11-04 NOTE — Telephone Encounter (Signed)
Patient was seen at [facility] and Dr. Hervey Ard advises patient been evaluated for ankylosing spondylitis based on a history of ulcerative colitis.

## 2016-11-06 DIAGNOSIS — J069 Acute upper respiratory infection, unspecified: Secondary | ICD-10-CM | POA: Diagnosis not present

## 2016-11-09 DIAGNOSIS — N390 Urinary tract infection, site not specified: Secondary | ICD-10-CM | POA: Diagnosis not present

## 2016-11-10 DIAGNOSIS — N390 Urinary tract infection, site not specified: Secondary | ICD-10-CM | POA: Diagnosis not present

## 2016-11-11 ENCOUNTER — Ambulatory Visit (INDEPENDENT_AMBULATORY_CARE_PROVIDER_SITE_OTHER): Payer: Federal, State, Local not specified - PPO | Admitting: Psychology

## 2016-11-11 DIAGNOSIS — F4323 Adjustment disorder with mixed anxiety and depressed mood: Secondary | ICD-10-CM | POA: Diagnosis not present

## 2016-11-12 DIAGNOSIS — R7989 Other specified abnormal findings of blood chemistry: Secondary | ICD-10-CM | POA: Diagnosis not present

## 2016-11-12 DIAGNOSIS — M542 Cervicalgia: Secondary | ICD-10-CM | POA: Diagnosis not present

## 2016-11-12 DIAGNOSIS — R269 Unspecified abnormalities of gait and mobility: Secondary | ICD-10-CM | POA: Diagnosis not present

## 2016-11-12 DIAGNOSIS — Z681 Body mass index (BMI) 19 or less, adult: Secondary | ICD-10-CM | POA: Diagnosis not present

## 2016-11-12 DIAGNOSIS — M545 Low back pain: Secondary | ICD-10-CM | POA: Diagnosis not present

## 2016-11-13 DIAGNOSIS — Z1589 Genetic susceptibility to other disease: Secondary | ICD-10-CM | POA: Diagnosis not present

## 2016-11-13 DIAGNOSIS — M129 Arthropathy, unspecified: Secondary | ICD-10-CM | POA: Diagnosis not present

## 2016-11-13 DIAGNOSIS — K50819 Crohn's disease of both small and large intestine with unspecified complications: Secondary | ICD-10-CM | POA: Diagnosis not present

## 2016-11-13 NOTE — Telephone Encounter (Signed)
Ok to sch appt

## 2016-11-14 NOTE — Telephone Encounter (Signed)
Please schedule patient for return office visit.

## 2016-11-14 NOTE — Telephone Encounter (Signed)
Called patient to schedule her ROV.  She stated that she wanted to hold off on making the appointment as she has seen another rheumatologist recently.

## 2016-11-15 DIAGNOSIS — M545 Low back pain: Secondary | ICD-10-CM | POA: Diagnosis not present

## 2016-11-15 DIAGNOSIS — R269 Unspecified abnormalities of gait and mobility: Secondary | ICD-10-CM | POA: Diagnosis not present

## 2016-11-15 DIAGNOSIS — M542 Cervicalgia: Secondary | ICD-10-CM | POA: Diagnosis not present

## 2016-11-19 DIAGNOSIS — M542 Cervicalgia: Secondary | ICD-10-CM | POA: Diagnosis not present

## 2016-11-19 DIAGNOSIS — R269 Unspecified abnormalities of gait and mobility: Secondary | ICD-10-CM | POA: Diagnosis not present

## 2016-11-19 DIAGNOSIS — M545 Low back pain: Secondary | ICD-10-CM | POA: Diagnosis not present

## 2016-11-25 ENCOUNTER — Ambulatory Visit (INDEPENDENT_AMBULATORY_CARE_PROVIDER_SITE_OTHER): Payer: Federal, State, Local not specified - PPO | Admitting: Psychology

## 2016-11-25 DIAGNOSIS — G8929 Other chronic pain: Secondary | ICD-10-CM | POA: Diagnosis not present

## 2016-11-25 DIAGNOSIS — R102 Pelvic and perineal pain: Secondary | ICD-10-CM | POA: Diagnosis not present

## 2016-11-25 DIAGNOSIS — F4323 Adjustment disorder with mixed anxiety and depressed mood: Secondary | ICD-10-CM

## 2016-11-26 DIAGNOSIS — M542 Cervicalgia: Secondary | ICD-10-CM | POA: Diagnosis not present

## 2016-11-26 DIAGNOSIS — M545 Low back pain: Secondary | ICD-10-CM | POA: Diagnosis not present

## 2016-11-26 DIAGNOSIS — R269 Unspecified abnormalities of gait and mobility: Secondary | ICD-10-CM | POA: Diagnosis not present

## 2016-12-05 DIAGNOSIS — R269 Unspecified abnormalities of gait and mobility: Secondary | ICD-10-CM | POA: Diagnosis not present

## 2016-12-05 DIAGNOSIS — M545 Low back pain: Secondary | ICD-10-CM | POA: Diagnosis not present

## 2016-12-05 DIAGNOSIS — M542 Cervicalgia: Secondary | ICD-10-CM | POA: Diagnosis not present

## 2016-12-09 ENCOUNTER — Ambulatory Visit (INDEPENDENT_AMBULATORY_CARE_PROVIDER_SITE_OTHER): Payer: Federal, State, Local not specified - PPO | Admitting: Psychology

## 2016-12-09 DIAGNOSIS — F4323 Adjustment disorder with mixed anxiety and depressed mood: Secondary | ICD-10-CM | POA: Diagnosis not present

## 2016-12-12 DIAGNOSIS — R269 Unspecified abnormalities of gait and mobility: Secondary | ICD-10-CM | POA: Diagnosis not present

## 2016-12-12 DIAGNOSIS — M542 Cervicalgia: Secondary | ICD-10-CM | POA: Diagnosis not present

## 2016-12-12 DIAGNOSIS — M545 Low back pain: Secondary | ICD-10-CM | POA: Diagnosis not present

## 2016-12-13 ENCOUNTER — Other Ambulatory Visit: Payer: Self-pay | Admitting: Nurse Practitioner

## 2016-12-13 DIAGNOSIS — R7989 Other specified abnormal findings of blood chemistry: Secondary | ICD-10-CM | POA: Diagnosis not present

## 2016-12-13 DIAGNOSIS — K50819 Crohn's disease of both small and large intestine with unspecified complications: Secondary | ICD-10-CM | POA: Diagnosis not present

## 2016-12-13 DIAGNOSIS — M129 Arthropathy, unspecified: Secondary | ICD-10-CM | POA: Diagnosis not present

## 2016-12-13 DIAGNOSIS — Z1589 Genetic susceptibility to other disease: Secondary | ICD-10-CM | POA: Diagnosis not present

## 2016-12-14 DIAGNOSIS — N390 Urinary tract infection, site not specified: Secondary | ICD-10-CM | POA: Diagnosis not present

## 2016-12-16 ENCOUNTER — Ambulatory Visit: Payer: Self-pay | Admitting: Family

## 2016-12-16 ENCOUNTER — Ambulatory Visit (INDEPENDENT_AMBULATORY_CARE_PROVIDER_SITE_OTHER): Payer: Federal, State, Local not specified - PPO | Admitting: Obstetrics and Gynecology

## 2016-12-16 ENCOUNTER — Encounter: Payer: Self-pay | Admitting: Obstetrics and Gynecology

## 2016-12-16 ENCOUNTER — Other Ambulatory Visit: Payer: Self-pay

## 2016-12-16 VITALS — BP 120/80 | HR 62 | Resp 12 | Ht 63.0 in | Wt 95.0 lb

## 2016-12-16 DIAGNOSIS — N760 Acute vaginitis: Secondary | ICD-10-CM | POA: Diagnosis not present

## 2016-12-16 DIAGNOSIS — G8929 Other chronic pain: Secondary | ICD-10-CM | POA: Diagnosis not present

## 2016-12-16 DIAGNOSIS — M545 Low back pain: Secondary | ICD-10-CM | POA: Diagnosis not present

## 2016-12-16 DIAGNOSIS — M4320 Fusion of spine, site unspecified: Secondary | ICD-10-CM | POA: Diagnosis not present

## 2016-12-16 MED ORDER — NYSTATIN-TRIAMCINOLONE 100000-0.1 UNIT/GM-% EX CREA: 1.0000 "application " | TOPICAL_CREAM | Freq: Two times a day (BID) | CUTANEOUS | 0 refills | Status: DC

## 2016-12-16 MED ORDER — FLUCONAZOLE 150 MG PO TABS: 150.0000 mg | ORAL_TABLET | Freq: Once | ORAL | 0 refills | Status: AC

## 2016-12-16 NOTE — Progress Notes (Signed)
GYNECOLOGY  VISIT   HPI: 46 y.o.   Married  [place] American  female   [phone] with Patient's last menstrual period was 08/07/2015.   here for complaints of mid-lower abdominal pain, Increased discharge, slight itching and irritation on vulva. Patient went to [facility] on Saturday. Was given Macrobid for bacteria in urine. Urine cx sent and was called this morning stating not a UTI. Patient stopped macrobid this morning, did not take today.    Maybe some odor.   Did have a UTI one month ago.   Ovaries remain following hysterectomy.   Has been on medial disability for rheumatologic symptoms.  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 May start Humira.  Did prednisone therapy.   Did some herbal therapies which did not help.  Teal tree oil and lavender.   GYNECOLOGIC HISTORY: Patient's last menstrual period was 08/07/2015. Contraception:  Hysterectomy Menopausal hormone therapy:  none Last mammogram: 04-08-16 Density D/Neg/BiRads1:TBC  Last pap smear: 03-31-15 Neg:Neg HR HPV;  2014 neg        OB History    Gravida Para Term Preterm AB Living   5 3 3     3    SAB TAB Ectopic Multiple Live Births                     Patient Active Problem List   Diagnosis Date Noted  . History of colitis 03/29/2016  . Elevated serum globulin level 12/29/2015  . Depression 09/15/2015  . Vitamin D deficiency 09/08/2015  . Anemia 09/08/2015  . Status post laparoscopic hysterectomy 08/21/2015  . Low calcium levels 07/14/2015  . Abnormal CT of the abdomen 04/24/2015  . Hematochezia 04/24/2015  . 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 Iron deficiency anemia 07/11/2014  . HLA B27 (HLA B27 positive) 07/06/2014  . Low serum vitamin D   . Non-celiac gluten sensitivity   . Spondyloarthritis [facility])     Past Medical History:  Diagnosis Date  . Abnormal CT of the abdomen   . Abnormal uterine bleeding   . Anemia    iron def  . Chicken pox   . Colitis   . Dysrhythmia    occassional fluttering  noted by patient  . Genital warts   . Gluten intolerance   . History of chlamydia   . History of hysterectomy 08/21/15  . HLA B27 (HLA B27 positive)   . HSV-1 (herpes simplex virus 1) infection   . Low calcium levels   . Low iron   . Low serum vitamin D   . Muscle spasms of both lower extremities    Spasms in lower back  . Non-celiac gluten sensitivity   . Reactive airway disease   . 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 UTI (lower urinary tract infection)     Past Surgical History:  Procedure Laterality Date  . ABDOMINAL HYSTERECTOMY  08/21/15  . BILATERAL SALPINGECTOMY Bilateral 08/21/2015   Procedure: BILATERAL SALPINGECTOMY;  Surgeon: Nunzio Cobbs, MD;  Location: [facility];  Service: Gynecology;  Laterality: Bilateral;  . COLONOSCOPY    . CYSTOSCOPY N/A 08/21/2015   Procedure: CYSTOSCOPY;  Surgeon: Nunzio Cobbs, MD;  Location: [facility];  Service: Gynecology;  Laterality: N/A;  . MOUTH SURGERY     cyst in mouth  . ROBOTIC ASSISTED TOTAL HYSTERECTOMY WITH SALPINGECTOMY Bilateral 08/21/2015   Procedure: ROBOTIC ASSISTED TOTAL HYSTERECTOMY ;  Surgeon: Nunzio Cobbs, MD;  Location: [facility];  Service: Gynecology;  Laterality: Bilateral;  . WISDOM TOOTH EXTRACTION      Current Outpatient Prescriptions  Medication Sig Dispense Refill  . acetaminophen (TYLENOL) 500 MG tablet Take 1,000 mg by mouth 2 (two) times daily as needed.    . mometasone-formoterol (DULERA) 100-5 MCG/ACT AERO Inhale 2 puffs into the lungs 2 (two) times daily. 1 Inhaler 11  . predniSONE (DELTASONE) 10 MG tablet     . PROAIR HFA 108 (90 BASE) MCG/ACT inhaler Inhale 2 puffs into the lungs every 4 (four) hours as needed. Reported on 03/29/2016    . Probiotic Product (PROBIOTIC PO) Take 1 tablet by mouth at bedtime. Natren     Current Facility-Administered Medications  Medication Dose Route Frequency Provider Last Rate Last Dose  . 0.9 %  sodium chloride infusion  500 mL  Intravenous Continuous Manus Gunning, MD         ALLERGIES: Gluten meal; Tramadol; Codeine; and Peanut-containing drug products  Family History  Problem Relation Age of Onset  . Hypertension Mother   . Diabetes Mother     ? father  . Cancer Paternal Grandmother     ? type  . Diabetes Father   . Colon polyps Father   . Prostate cancer Father     Social History   Social History  . Marital status: Married    Spouse name: N/A  . Number of children: N/A  . Years of education: N/A   Occupational History  . Not on file.   Social History Main Topics  . Smoking status: Never Smoker  . Smokeless tobacco: Never Used  . Alcohol use 1.2 oz/week    2 Standard drinks or equivalent per week  . Drug use: No  . Sexual activity: Yes    Partners: Male     Comment: Hyst   Other Topics Concern  . 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 [name]. [name].    Got B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  PHYSICAL EXAMINATION:    BP 120/80 (BP Location: Right Arm, Patient Position: Sitting, Cuff Size: Small)   Pulse 62   Resp 12   Ht 5' 3"  (1.6 m)   Wt 95 lb (43.1 kg)   LMP 08/07/2015   BMI 16.83 kg/m     General appearance: alert, cooperative and appears stated age   Pelvic: External genitalia:  Alit in skin on perineum.               Urethra:  normal appearing urethra with no masses, tenderness or lesions              Bartholins and Skenes: normal                 Vagina: normal appearing vagina with normal color and discharge, no lesions              Cervix:  Absent.                Bimanual Exam:  Uterus: absent.              Adnexa: no mass, fullness, tenderness  Chaperone was present for  exam.  ASSESSMENT  Vulvovaginitis.  Steroid use.  Recent abx use.  Classic for yeast infection.   PLAN  Discussed vulvovaginitis and risk factors for this. Affirm done.  Diflucan 150 mg po x 1.  May repeat in 72 hours.  Mycolog II.  Follow up for annual exam in about 1 month and prn.    An After Visit Summary was printed and given to the patient.  ___15___ minutes face to face time of which over 50% was spent in counseling.

## 2016-12-17 LAB — WET PREP BY MOLECULAR PROBE
Candida species: DETECTED — AB
GARDNERELLA VAGINALIS: NOT DETECTED
TRICHOMONAS VAG: NOT DETECTED

## 2016-12-23 ENCOUNTER — Ambulatory Visit (INDEPENDENT_AMBULATORY_CARE_PROVIDER_SITE_OTHER): Payer: Federal, State, Local not specified - PPO | Admitting: Psychology

## 2016-12-23 DIAGNOSIS — F4323 Adjustment disorder with mixed anxiety and depressed mood: Secondary | ICD-10-CM

## 2016-12-26 DIAGNOSIS — M545 Low back pain: Secondary | ICD-10-CM | POA: Diagnosis not present

## 2016-12-26 DIAGNOSIS — M542 Cervicalgia: Secondary | ICD-10-CM | POA: Diagnosis not present

## 2016-12-26 DIAGNOSIS — R269 Unspecified abnormalities of gait and mobility: Secondary | ICD-10-CM | POA: Diagnosis not present

## 2017-01-10 ENCOUNTER — Ambulatory Visit (INDEPENDENT_AMBULATORY_CARE_PROVIDER_SITE_OTHER): Payer: Federal, State, Local not specified - PPO | Admitting: Psychology

## 2017-01-10 DIAGNOSIS — F4323 Adjustment disorder with mixed anxiety and depressed mood: Secondary | ICD-10-CM

## 2017-01-29 DIAGNOSIS — Z1589 Genetic susceptibility to other disease: Secondary | ICD-10-CM | POA: Diagnosis not present

## 2017-01-29 DIAGNOSIS — M450 Ankylosing spondylitis of multiple sites in spine: Secondary | ICD-10-CM | POA: Insufficient documentation

## 2017-01-29 DIAGNOSIS — Z79899 Other long term (current) drug therapy: Secondary | ICD-10-CM | POA: Insufficient documentation

## 2017-01-30 DIAGNOSIS — K50819 Crohn's disease of both small and large intestine with unspecified complications: Secondary | ICD-10-CM | POA: Diagnosis not present

## 2017-01-30 DIAGNOSIS — Z7952 Long term (current) use of systemic steroids: Secondary | ICD-10-CM | POA: Diagnosis not present

## 2017-01-30 DIAGNOSIS — Z79899 Other long term (current) drug therapy: Secondary | ICD-10-CM | POA: Diagnosis not present

## 2017-01-30 DIAGNOSIS — R7989 Other specified abnormal findings of blood chemistry: Secondary | ICD-10-CM | POA: Diagnosis not present

## 2017-01-30 DIAGNOSIS — M450 Ankylosing spondylitis of multiple sites in spine: Secondary | ICD-10-CM | POA: Diagnosis not present

## 2017-02-03 ENCOUNTER — Ambulatory Visit (INDEPENDENT_AMBULATORY_CARE_PROVIDER_SITE_OTHER): Payer: Federal, State, Local not specified - PPO | Admitting: Psychology

## 2017-02-03 DIAGNOSIS — F4323 Adjustment disorder with mixed anxiety and depressed mood: Secondary | ICD-10-CM | POA: Diagnosis not present

## 2017-02-18 ENCOUNTER — Ambulatory Visit: Payer: Federal, State, Local not specified - PPO | Admitting: Certified Nurse Midwife

## 2017-02-18 ENCOUNTER — Encounter: Payer: Self-pay | Admitting: Certified Nurse Midwife

## 2017-02-18 VITALS — BP 90/60 | HR 60 | Temp 98.3°F | Resp 16 | Ht 63.0 in | Wt 100.0 lb

## 2017-02-18 DIAGNOSIS — K5090 Crohn's disease, unspecified, without complications: Secondary | ICD-10-CM | POA: Insufficient documentation

## 2017-02-18 DIAGNOSIS — R140 Abdominal distension (gaseous): Secondary | ICD-10-CM | POA: Diagnosis not present

## 2017-02-18 DIAGNOSIS — Z8669 Personal history of other diseases of the nervous system and sense organs: Secondary | ICD-10-CM | POA: Insufficient documentation

## 2017-02-18 DIAGNOSIS — N390 Urinary tract infection, site not specified: Secondary | ICD-10-CM | POA: Diagnosis not present

## 2017-02-18 LAB — POCT URINALYSIS DIPSTICK
BILIRUBIN UA: NEGATIVE
GLUCOSE UA: NEGATIVE
KETONES UA: NEGATIVE
Leukocytes, UA: NEGATIVE
Nitrite, UA: NEGATIVE
Protein, UA: NEGATIVE
RBC UA: NEGATIVE
Urobilinogen, UA: NEGATIVE E.U./dL — AB
pH, UA: 5 (ref 5.0–8.0)

## 2017-02-18 NOTE — Patient Instructions (Signed)

## 2017-02-18 NOTE — Progress Notes (Signed)
46 y.o. Married [place] American female [phone] here with concerns of early onset of UTI. Recent start on Hummira and has noted some urinary symptoms.Complaining of fullness and slight discomfort in bladder area.. Patient denies of urinary frequency/urgency/ and pain with urination. Patient denies fever, chills, nausea or back pain. Denies any constipation or diarrhea or gas issues. No new personal products. Patient feels maybe related to sexual activity. Denies any vaginal symptoms of itching, burning or vaginal dryness..    Contraception is  Hysterectomy.. . Patient consuming adequate water intake also cranberry juice daily. Just worried since starting Humira with Methotrexate. No other health issues today.  ROS pertinent to HPI  POCT urine: negative  O: Healthy female WDWN Affect: Normal, orientation x 3 Skin : warm and dry CVAT: negative bilateral Abdomen: soft, no masses or rebound tenderness, positive bowel sounds all 4 quadrnats and negative for suprapubic tenderness  Pelvic exam: External genital area: normal, no lesions Bladder,Urethra non  tender, Urethral meatus:non tender,  No redness Vagina: normal vaginal discharge, normal appearance   Cervix and Uterus surgically absent Adnexa: normal non tender, no fullness or masses   A: Normal pelvic exam ? Bloating related to new medication combination R/O UTI  P: Reviewed findings of normal pelvic exam and no symptoms or physical findings indicating UTI. Reviewed Up To Date literature and common side effects, no urinary noted on either medication. Occasional GI side effects. Warning signs of UTI given and need to advise. If continues to have bloating should call her MD. Patient appreciative to information. Lab Urine Culture Encouraged to limit soda, tea, and coffee and be sure to increase water intake.   RV prn

## 2017-02-19 ENCOUNTER — Ambulatory Visit: Payer: Federal, State, Local not specified - PPO | Admitting: Obstetrics and Gynecology

## 2017-02-19 LAB — URINE CULTURE: Organism ID, Bacteria: NO GROWTH

## 2017-02-20 ENCOUNTER — Telehealth: Payer: Self-pay

## 2017-02-20 NOTE — Telephone Encounter (Signed)
lmtcb

## 2017-02-20 NOTE — Telephone Encounter (Signed)
-----   Message from Regina Eck, CNM sent at 02/20/2017 12:03 PM EDT ----- Notify patient urine culture was negative for growth Patient status?

## 2017-02-21 NOTE — Telephone Encounter (Signed)
Patient notified of results. See lab 

## 2017-02-24 ENCOUNTER — Ambulatory Visit (INDEPENDENT_AMBULATORY_CARE_PROVIDER_SITE_OTHER): Payer: Federal, State, Local not specified - PPO | Admitting: Obstetrics and Gynecology

## 2017-02-24 ENCOUNTER — Encounter: Payer: Self-pay | Admitting: Obstetrics and Gynecology

## 2017-02-24 VITALS — BP 110/76 | HR 66 | Resp 16 | Ht 65.0 in | Wt 101.4 lb

## 2017-02-24 DIAGNOSIS — Z01419 Encounter for gynecological examination (general) (routine) without abnormal findings: Secondary | ICD-10-CM

## 2017-02-24 NOTE — Progress Notes (Deleted)
GYNECOLOGY  VISIT   HPI: 45 y.o.   Married  African American  female   [id number] with Patient's last menstrual period was 08/07/2015.   here for     GYNECOLOGIC HISTORY: Patient's last menstrual period was 08/07/2015. Contraception:  Hysterectomy--ovaries remain Menopausal hormone therapy:  *** Last mammogram: 04-08-16 Density D/Neg/BiRads1:TBC  Last pap smear:         OB History    Gravida Para Term Preterm AB Living   5 3 3   2 3   SAB TAB Ectopic Multiple Live Births   1 1               Patient Active Problem List   Diagnosis Date Noted  . Crohn disease (HCC) 02/18/2017  . History of uveitis 02/18/2017  . Ankylosing spondylitis of multiple sites in spine (HCC) 01/29/2017  . Encounter for long-term (current) use of high-risk medication 01/29/2017  . Abnormal loss of weight 11/02/2016  . Crohn's disease of both small and large intestine with complication (HCC) 11/01/2016  . Elevated ferritin level 11/01/2016  . History of colitis 03/29/2016  . Elevated serum globulin level 12/29/2015  . Depression 09/15/2015  . Vitamin D deficiency 09/08/2015  . Anemia 09/08/2015  . Status post laparoscopic hysterectomy 08/21/2015  . Low calcium levels 07/14/2015  . Abnormal CT of the abdomen 04/24/2015  . Hematochezia 04/24/2015  . 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 Extrinsic asthma 07/12/2014  . Iron deficiency anemia 07/11/2014  . HLA B27 positive 07/06/2014  . Low serum vitamin D   . Non-celiac gluten sensitivity   . 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HCC)     Past Medical History:  Diagnosis Date  . Abnormal CT of the abdomen   . Abnormal uterine bleeding   . Anemia    iron def  . Ankylosing spondylitis (HCC)   . Chicken pox   . Colitis   . Crohn disease (HCC)   . Dysrhythmia    occassional fluttering noted by patient  . Genital warts   . Gluten intolerance   . History of chlamydia   . History of hysterectomy 08/21/15  . HLA B27 (HLA B27 positive)   . HSV-1 (herpes  simplex virus 1) infection   . Low calcium levels   . Low iron   . Low serum vitamin D   . Muscle spasms of both lower extremities    Spasms in lower back  . Non-celiac gluten sensitivity   . Reactive airway disease   . Spondyloarthritis (HCC)    genetic marker  . UTI (lower urinary tract infection)     Past Surgical History:  Procedure Laterality Date  . ABDOMINAL HYSTERECTOMY  08/21/15  . BILATERAL SALPINGECTOMY Bilateral 08/21/2015   Procedure: BILATERAL SALPINGECTOMY;  Surgeon: Brook E Amundson C Silva, MD;  Location: [facility];  Service: Gynecology;  Laterality: Bilateral;  . COLONOSCOPY    . CYSTOSCOPY N/A 08/21/2015   Procedure: CYSTOSCOPY;  Surgeon: Brook E Amundson C Silva, MD;  Location: [facility];  Service: Gynecology;  Laterality: N/A;  . MOUTH SURGERY     cyst in mouth  . ROBOTIC ASSISTED TOTAL HYSTERECTOMY WITH SALPINGECTOMY Bilateral 08/21/2015   Procedure: ROBOTIC ASSISTED TOTAL HYSTERECTOMY ;  Surgeon: Brook E Amundson C Silva, MD;  Location: [facility];  Service: Gynecology;  Laterality: Bilateral;  . WISDOM TOOTH EXTRACTION      Current Outpatient Prescriptions  Medication Sig Dispense Refill  . Adalimumab 40 MG/0.8ML PNKT Inject into the skin.    . folic   acid (FOLVITE) 1 MG tablet Take by mouth.    . methotrexate (RHEUMATREX) 2.5 MG tablet Take by mouth. Takes 8 of them    . predniSONE (DELTASONE) 10 MG tablet Tapering down. Now at 9mg    . PROAIR HFA 108 (90 BASE) MCG/ACT inhaler Inhale 2 puffs into the lungs every 4 (four) hours as needed. Reported on 03/29/2016     No current facility-administered medications for this visit.      ALLERGIES: Gluten meal; Tramadol; Codeine; and Peanut-containing drug products  Family History  Problem Relation Age of Onset  . Hypertension Mother   . Diabetes Mother        ? father  . Cancer Paternal Grandmother        ? type  . Diabetes Father   . Colon polyps Father   . Prostate cancer Father     Social History   Social  History  . Marital status: Married    Spouse name: N/A  . Number of children: N/A  . Years of education: N/A   Occupational History  . Not on file.   Social History Main Topics  . Smoking status: Never Smoker  . Smokeless tobacco: Never Used  . Alcohol use 0.0 - 0.6 oz/week  . Drug use: No  . Sexual activity: Yes    Partners: Male     Comment: Hyst   Other Topics Concern  . Not on file   Social History Narrative   Married 1999 (Mark). 3 kids. Justin '95. [name].    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  PHYSICAL EXAMINATION:    LMP 08/07/2015     General appearance: alert, cooperative and appears stated age Head: Normocephalic, without obvious abnormality, atraumatic Neck: no adenopathy, supple, symmetrical, trachea midline and thyroid normal to inspection and palpation Lungs: clear to auscultation bilaterally Breasts: normal appearance, no masses or tenderness, No nipple retraction or dimpling, No nipple discharge or bleeding, No axillary or supraclavicular adenopathy Heart: regular rate and rhythm Abdomen: soft, non-tender, no masses,  no organomegaly Extremities: extremities normal, atraumatic, no cyanosis or edema Skin: Skin color, texture, turgor normal. No rashes or lesions Lymph nodes: Cervical, supraclavicular, and axillary nodes normal. No abnormal inguinal nodes palpated Neurologic: Grossly normal  Pelvic: External genitalia:  no lesions              Urethra:  normal appearing urethra with no masses, tenderness or lesions              Bartholins and Skenes: normal                 Vagina: normal appearing vagina with normal color and discharge, no lesions              Cervix: no lesions                Bimanual Exam:  Uterus:   normal size, contour, position, consistency, mobility, non-tender              Adnexa: no mass, fullness, tenderness              Rectal exam: {yes no:314532}.  Confirms.                Anus:  normal sphincter tone, no lesions  Chaperone was present for exam.  ASSESSMENT     PLAN     An After Visit Summary was printed and given to the patient.  ______ minutes face to face time of which over 50% was spent in counseling.   

## 2017-02-24 NOTE — Patient Instructions (Signed)

## 2017-02-24 NOTE — Progress Notes (Signed)
46 y.o. [id number] Married [place] American female here for annual exam.    Seen 02/18/17 for UTI symptoms.  Was feeling lower abdominal uneasiness and bloating.  UC was negative.  On Humira for the last 10 days. Feeling better now.  Is tapering off Prednisone.   Was treated for yeast vulvovaginitis 12/16/16.  PCP:   [name], NP.  Sees once a year.  Rheumatologist and GI at [facility].  Patient's last menstrual period was 08/07/2015.           Sexually active: Yes.   female The current method of family planning is status post hysterectomy.    Exercising: Yes.    Pilates, yoga, strength, eliptical Smoker:  no  Health Maintenance: Pap: 03-31-15 Neg:Neg HR HPV;  2014 neg History of abnormal Pap:  no MMG:  04-08-16 Density D/Neg/BiRads1:TBC  Colonoscopy: 06-28-16 crohns colitis Dr.Armbruster--now seeing GI at [facility].   BMD:   n/a  Result  n/a TDaP:  2009 Gardasil:   no HIV: thinks had 2017--Neg Hep C: 2017 Neg Screening Labs:  Hb today: Rheumatologist, Urine today: not done   reports that she has never smoked. She has never used smokeless tobacco. She reports that she drinks about 0.6 - 1.2 oz of alcohol per week . She reports that she does not use drugs.  Past Medical History:  Diagnosis Date  . Abnormal CT of the abdomen   . Abnormal uterine bleeding   . Anemia    iron def  . Ankylosing spondylitis ([place])   . Chicken pox   . Colitis   . Crohn disease ([place])   . Dysrhythmia    occassional fluttering noted by patient  . Genital warts   . Gluten intolerance   . History of chlamydia   . History of hysterectomy 08/21/15  . HLA B27 (HLA B27 positive)   . HSV-1 (herpes simplex virus 1) infection   . Low calcium levels   . Low iron   . Low serum vitamin D   . Muscle spasms of both lower extremities    Spasms in lower back  . Non-celiac gluten sensitivity   . Reactive airway disease   . Spondyloarthritis ([place])    genetic marker  . UTI (lower urinary tract infection)      Past Surgical History:  Procedure Laterality Date  . ABDOMINAL HYSTERECTOMY  08/21/15  . BILATERAL SALPINGECTOMY Bilateral 08/21/2015   Procedure: BILATERAL SALPINGECTOMY;  Surgeon: Nunzio Cobbs, MD;  Location: [facility];  Service: Gynecology;  Laterality: Bilateral;  . COLONOSCOPY    . CYSTOSCOPY N/A 08/21/2015   Procedure: CYSTOSCOPY;  Surgeon: Nunzio Cobbs, MD;  Location: [facility];  Service: Gynecology;  Laterality: N/A;  . MOUTH SURGERY     cyst in mouth  . ROBOTIC ASSISTED TOTAL HYSTERECTOMY WITH SALPINGECTOMY Bilateral 08/21/2015   Procedure: ROBOTIC ASSISTED TOTAL HYSTERECTOMY ;  Surgeon: Nunzio Cobbs, MD;  Location: [facility];  Service: Gynecology;  Laterality: Bilateral;  . WISDOM TOOTH EXTRACTION      Current Outpatient Prescriptions  Medication Sig Dispense Refill  . Adalimumab 40 MG/0.8ML PNKT Inject into the skin.    . folic acid (FOLVITE) 1 MG tablet Take by mouth.    . methotrexate (RHEUMATREX) 2.5 MG tablet Take by mouth. Takes 8 of them    . predniSONE (DELTASONE) 10 MG tablet Tapering down. Now at 30m    . PROAIR HFA 108 (90 BASE) MCG/ACT inhaler Inhale 2 puffs into the lungs  every 4 (four) hours as needed. Reported on 03/29/2016    . nystatin-triamcinolone (MYCOLOG II) cream Apply 1 application topically as needed.     No current facility-administered medications for this visit.     Family History  Problem Relation Age of Onset  . Hypertension Mother   . Diabetes Mother        ? father  . Anxiety disorder Mother   . Cancer Paternal Grandmother        ? type  . Diabetes Father   . Colon polyps Father   . Prostate cancer Father     ROS:  Pertinent items are noted in HPI.  Otherwise, a comprehensive ROS was negative.  Exam:   BP 110/76 (BP Location: Right Arm, Patient Position: Sitting, Cuff Size: Normal)   Pulse 66   Resp 16   Ht 5' 5"  (1.651 m)   Wt 101 lb 6.4 oz (46 kg)   LMP 08/07/2015   BMI 16.87 kg/m     General  appearance: alert, cooperative and appears stated age Head: Normocephalic, without obvious abnormality, atraumatic Neck: no adenopathy, supple, symmetrical, trachea midline and thyroid normal to inspection and palpation Lungs: clear to auscultation bilaterally Breasts: normal appearance, no masses or tenderness, No nipple retraction or dimpling, No nipple discharge or bleeding, No axillary or supraclavicular adenopathy Heart: regular rate and rhythm Abdomen: soft, non-tender; no masses, no organomegaly Extremities: extremities normal, atraumatic, no cyanosis or edema Skin: Skin color, texture, turgor normal. No rashes or lesions Lymph nodes: Cervical, supraclavicular, and axillary nodes normal. No abnormal inguinal nodes palpated Neurologic: Grossly normal  Pelvic: External genitalia:  no lesions              Urethra:  normal appearing urethra with no masses, tenderness or lesions              Bartholins and Skenes: normal                 Vagina: normal appearing vagina with normal color and discharge, no lesions              Cervix:  absent              Pap taken:  No. Bimanual Exam:  Uterus:  Absent.              Adnexa: no mass, fullness, tenderness              Rectal exam: Yes.  .  Confirms.              Anus:  normal sphincter tone, no lesions  Chaperone was present for exam.  Assessment:   Well woman visit with normal exam. Hx robotic TLH, bilateral salpingectomy.   Plan: Mammogram screening discussed. Recommended self breast awareness. Pap and HR HPV as above. Guidelines for Calcium, Vitamin D, regular exercise program including cardiovascular and weight bearing exercise.   Follow up annually and prn.   After visit summary provided.

## 2017-02-25 ENCOUNTER — Ambulatory Visit (INDEPENDENT_AMBULATORY_CARE_PROVIDER_SITE_OTHER): Payer: Federal, State, Local not specified - PPO | Admitting: Psychology

## 2017-02-25 DIAGNOSIS — F4323 Adjustment disorder with mixed anxiety and depressed mood: Secondary | ICD-10-CM | POA: Diagnosis not present

## 2017-02-28 ENCOUNTER — Ambulatory Visit: Payer: Federal, State, Local not specified - PPO | Admitting: Psychology

## 2017-03-10 ENCOUNTER — Ambulatory Visit (INDEPENDENT_AMBULATORY_CARE_PROVIDER_SITE_OTHER): Payer: Federal, State, Local not specified - PPO | Admitting: Psychology

## 2017-03-10 DIAGNOSIS — F4323 Adjustment disorder with mixed anxiety and depressed mood: Secondary | ICD-10-CM | POA: Diagnosis not present

## 2017-03-19 ENCOUNTER — Encounter: Payer: Self-pay | Admitting: Obstetrics and Gynecology

## 2017-03-19 ENCOUNTER — Ambulatory Visit (INDEPENDENT_AMBULATORY_CARE_PROVIDER_SITE_OTHER): Payer: Federal, State, Local not specified - PPO | Admitting: Obstetrics and Gynecology

## 2017-03-19 VITALS — BP 100/66 | HR 68 | Temp 98.0°F | Resp 16 | Ht 65.0 in | Wt 102.0 lb

## 2017-03-19 DIAGNOSIS — N760 Acute vaginitis: Secondary | ICD-10-CM | POA: Diagnosis not present

## 2017-03-19 NOTE — Progress Notes (Signed)
GYNECOLOGY  VISIT  HPI: 46 y.o.   Married  African American  female   [id number] with Patient's last menstrual period was 08/07/2015.   here for  Yeast infection Has come concerns about frequent office visits for vaginitis.   3 days ago started having discharge.   Eat flax seed cake and felt inflammation following this.  Developed profuse external vulvar itching.  Used her daughter's Diflucan 2 nights ago.  Used some Mycolog cream as well which seems to have helped.  Some lower abdominal uneasiness.  No dysuria or urgency. No blood in the urine.   Had yeast infection in March 2018.   Drinks a lot of teas with natural probiotics.   Has oral sex.  No recent abx.  Uses Dove soap.   No problems with bowel function.  Some mucous.   On Humira.   GYNECOLOGIC HISTORY: Patient's last menstrual period was 08/07/2015. Contraception:  Hysterectomy Menopausal hormone therapy:  none Last mammogram:  04-08-16 Density D/Neg/BiRads1:TBC Last pap smear:   03-31-15 Neg:Neg HR HPV; 2014 neg        OB History    Gravida Para Term Preterm AB Living   5 3 3   2 3   SAB TAB Ectopic Multiple Live Births   1 1               Patient Active Problem List   Diagnosis Date Noted  . Crohn disease (HCC) 02/18/2017  . History of uveitis 02/18/2017  . Ankylosing spondylitis of multiple sites in spine (HCC) 01/29/2017  . Encounter for long-term (current) use of high-risk medication 01/29/2017  . Abnormal loss of weight 11/02/2016  . Crohn's disease of both small and large intestine with complication (HCC) 11/01/2016  . Elevated ferritin level 11/01/2016  . History of colitis 03/29/2016  . Elevated serum globulin level 12/29/2015  . Depression 09/15/2015  . Vitamin D deficiency 09/08/2015  . Anemia 09/08/2015  . Status post laparoscopic hysterectomy 08/21/2015  . Low calcium levels 07/14/2015  . Abnormal CT of the abdomen 04/24/2015  . Hematochezia 04/24/2015  . Mild persistent extrinsic  asthma 07/12/2014  . Extrinsic asthma 07/12/2014  . Iron deficiency anemia 07/11/2014  . HLA B27 positive 07/06/2014  . Low serum vitamin D   . Non-celiac gluten sensitivity   . 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HCC)     Past Medical History:  Diagnosis Date  . Abnormal CT of the abdomen   . Abnormal uterine bleeding   . Anemia    iron def  . Ankylosing spondylitis (HCC)   . Chicken pox   . Colitis   . Crohn disease (HCC)   . Dysrhythmia    occassional fluttering noted by patient  . Genital warts   . Gluten intolerance   . History of chlamydia   . History of hysterectomy 08/21/15  . HLA B27 (HLA B27 positive)   . HSV-1 (herpes simplex virus 1) infection   . Low calcium levels   . Low iron   . Low serum vitamin D   . Muscle spasms of both lower extremities    Spasms in lower back  . Non-celiac gluten sensitivity   . Reactive airway disease   . Spondyloarthritis (HCC)    genetic marker  . UTI (lower urinary tract infection)     Past Surgical History:  Procedure Laterality Date  . ABDOMINAL HYSTERECTOMY  08/21/15  . BILATERAL SALPINGECTOMY Bilateral 08/21/2015   Procedure: BILATERAL SALPINGECTOMY;  Surgeon: Brook E Amundson C Silva, MD;  Location:   [facility];  Service: Gynecology;  Laterality: Bilateral;  . COLONOSCOPY    . CYSTOSCOPY N/A 08/21/2015   Procedure: CYSTOSCOPY;  Surgeon: Brook E Amundson C Silva, MD;  Location: [facility];  Service: Gynecology;  Laterality: N/A;  . MOUTH SURGERY     cyst in mouth  . ROBOTIC ASSISTED TOTAL HYSTERECTOMY WITH SALPINGECTOMY Bilateral 08/21/2015   Procedure: ROBOTIC ASSISTED TOTAL HYSTERECTOMY ;  Surgeon: Brook E Amundson C Silva, MD;  Location: [facility];  Service: Gynecology;  Laterality: Bilateral;  . WISDOM TOOTH EXTRACTION      Current Outpatient Prescriptions  Medication Sig Dispense Refill  . Adalimumab 40 MG/0.8ML PNKT Inject into the skin.    . folic acid (FOLVITE) 1 MG tablet Take by mouth.    . methotrexate (RHEUMATREX) 2.5 MG tablet  Take by mouth. Takes 8 of them    . nystatin-triamcinolone (MYCOLOG II) cream Apply 1 application topically as needed.    . predniSONE (DELTASONE) 10 MG tablet Tapering down. Now at 7mg    . PROAIR HFA 108 (90 BASE) MCG/ACT inhaler Inhale 2 puffs into the lungs every 4 (four) hours as needed. Reported on 03/29/2016     No current facility-administered medications for this visit.      ALLERGIES: Gluten meal; Tramadol; Codeine; and Peanut-containing drug products  Family History  Problem Relation Age of Onset  . Hypertension Mother   . Diabetes Mother        ? father  . Anxiety disorder Mother   . Cancer Paternal Grandmother        ? type  . Diabetes Father   . Colon polyps Father   . Prostate cancer Father     Social History   Social History  . Marital status: Married    Spouse name: N/A  . Number of children: N/A  . Years of education: N/A   Occupational History  . Not on file.   Social History Main Topics  . Smoking status: Never Smoker  . Smokeless tobacco: Never Used  . Alcohol use 0.6 - 1.2 oz/week    1 Glasses of wine per week  . Drug use: No  . Sexual activity: Yes    Partners: Male     Comment: Hyst   Other Topics Concern  . Not on file   Social History Narrative   Married 1999 (Mark). 3 kids. Justin '95. [name].    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  PHYSICAL EXAMINATION:    BP 100/66 (BP Location: Right Arm, Patient Position: Sitting, Cuff Size: Normal)   Pulse 68   Temp 98 F (36.7 C) (Oral)   Resp 16   Ht 5' 5" (1.651 m)   Wt 102 lb (46.3 kg)   LMP 08/07/2015   BMI 16.97 kg/m     General appearance: alert, cooperative and appears stated age   Pelvic: External genitalia:  no lesions              Urethra:   normal appearing urethra with no masses, tenderness or lesions              Bartholins and Skenes: normal                   Vagina: normal appearing vagina with normal color and discharge, no lesions              Cervix:  Absent.                Bimanual Exam:  Uterus:  Absent.               Adnexa: no mass, fullness, tenderness           Chaperone was present for exam.  ASSESSMENT  Vulvovaginitis. On Humira and Methotrexate.  PLAN  Discussed different types of vaginitis. Affirm. Ok for Mycolog cream externally bid for one week.  Has enough at home.  Continue probiotics. We discussed over the counter Monistat if needed and then office visits if symptoms do not improve. We talked about her immunosuppressive medications as risk factors for infections.  Follow up as needed.   An After Visit Summary was printed and given to the patient.  ___15___ minutes face to face time of which over 50% was spent in counseling.   

## 2017-03-20 ENCOUNTER — Other Ambulatory Visit: Payer: Self-pay | Admitting: *Deleted

## 2017-03-20 LAB — VAGINITIS/VAGINOSIS, DNA PROBE
CANDIDA SPECIES: NEGATIVE
GARDNERELLA VAGINALIS: POSITIVE — AB
TRICHOMONAS VAG: NEGATIVE

## 2017-03-20 MED ORDER — METRONIDAZOLE 0.75 % VA GEL: 1.0000 | Freq: Every day | VAGINAL | 0 refills | Status: DC

## 2017-03-28 ENCOUNTER — Ambulatory Visit (INDEPENDENT_AMBULATORY_CARE_PROVIDER_SITE_OTHER): Payer: Federal, State, Local not specified - PPO | Admitting: Psychology

## 2017-03-28 DIAGNOSIS — F4323 Adjustment disorder with mixed anxiety and depressed mood: Secondary | ICD-10-CM

## 2017-04-04 DIAGNOSIS — Z1589 Genetic susceptibility to other disease: Secondary | ICD-10-CM | POA: Diagnosis not present

## 2017-04-04 DIAGNOSIS — Z79899 Other long term (current) drug therapy: Secondary | ICD-10-CM | POA: Diagnosis not present

## 2017-04-04 DIAGNOSIS — M450 Ankylosing spondylitis of multiple sites in spine: Secondary | ICD-10-CM | POA: Diagnosis not present

## 2017-04-07 DIAGNOSIS — R7989 Other specified abnormal findings of blood chemistry: Secondary | ICD-10-CM | POA: Diagnosis not present

## 2017-04-07 DIAGNOSIS — Z7951 Long term (current) use of inhaled steroids: Secondary | ICD-10-CM | POA: Diagnosis not present

## 2017-04-07 DIAGNOSIS — Z885 Allergy status to narcotic agent status: Secondary | ICD-10-CM | POA: Diagnosis not present

## 2017-04-07 DIAGNOSIS — K5090 Crohn's disease, unspecified, without complications: Secondary | ICD-10-CM | POA: Diagnosis not present

## 2017-04-07 DIAGNOSIS — Z7952 Long term (current) use of systemic steroids: Secondary | ICD-10-CM | POA: Diagnosis not present

## 2017-04-07 DIAGNOSIS — Z79899 Other long term (current) drug therapy: Secondary | ICD-10-CM | POA: Diagnosis not present

## 2017-04-07 DIAGNOSIS — Z681 Body mass index (BMI) 19 or less, adult: Secondary | ICD-10-CM | POA: Diagnosis not present

## 2017-04-14 ENCOUNTER — Ambulatory Visit (INDEPENDENT_AMBULATORY_CARE_PROVIDER_SITE_OTHER): Payer: Federal, State, Local not specified - PPO | Admitting: Psychology

## 2017-04-14 DIAGNOSIS — F4323 Adjustment disorder with mixed anxiety and depressed mood: Secondary | ICD-10-CM

## 2017-04-28 ENCOUNTER — Ambulatory Visit (INDEPENDENT_AMBULATORY_CARE_PROVIDER_SITE_OTHER): Payer: Federal, State, Local not specified - PPO | Admitting: Psychology

## 2017-04-28 DIAGNOSIS — F4323 Adjustment disorder with mixed anxiety and depressed mood: Secondary | ICD-10-CM

## 2017-05-30 ENCOUNTER — Ambulatory Visit (INDEPENDENT_AMBULATORY_CARE_PROVIDER_SITE_OTHER): Payer: Federal, State, Local not specified - PPO | Admitting: Psychology

## 2017-05-30 DIAGNOSIS — F4323 Adjustment disorder with mixed anxiety and depressed mood: Secondary | ICD-10-CM | POA: Diagnosis not present

## 2017-06-20 DIAGNOSIS — Z7952 Long term (current) use of systemic steroids: Secondary | ICD-10-CM | POA: Diagnosis not present

## 2017-06-20 DIAGNOSIS — Z23 Encounter for immunization: Secondary | ICD-10-CM | POA: Diagnosis not present

## 2017-06-23 ENCOUNTER — Ambulatory Visit (INDEPENDENT_AMBULATORY_CARE_PROVIDER_SITE_OTHER): Payer: Federal, State, Local not specified - PPO | Admitting: Psychology

## 2017-06-23 DIAGNOSIS — F4323 Adjustment disorder with mixed anxiety and depressed mood: Secondary | ICD-10-CM

## 2017-06-30 DIAGNOSIS — Z1589 Genetic susceptibility to other disease: Secondary | ICD-10-CM | POA: Diagnosis not present

## 2017-06-30 DIAGNOSIS — Z7952 Long term (current) use of systemic steroids: Secondary | ICD-10-CM | POA: Diagnosis not present

## 2017-06-30 DIAGNOSIS — Z79899 Other long term (current) drug therapy: Secondary | ICD-10-CM | POA: Diagnosis not present

## 2017-06-30 DIAGNOSIS — M450 Ankylosing spondylitis of multiple sites in spine: Secondary | ICD-10-CM | POA: Diagnosis not present

## 2017-07-07 DIAGNOSIS — Z7952 Long term (current) use of systemic steroids: Secondary | ICD-10-CM | POA: Diagnosis not present

## 2017-08-19 DIAGNOSIS — H16223 Keratoconjunctivitis sicca, not specified as Sjogren's, bilateral: Secondary | ICD-10-CM | POA: Diagnosis not present

## 2017-08-22 ENCOUNTER — Encounter: Payer: Self-pay | Admitting: Medical

## 2017-08-22 ENCOUNTER — Ambulatory Visit: Payer: Federal, State, Local not specified - PPO | Admitting: Medical

## 2017-08-22 VITALS — BP 114/69 | HR 66 | Temp 98.2°F | Resp 16 | Wt 107.6 lb

## 2017-08-22 DIAGNOSIS — J40 Bronchitis, not specified as acute or chronic: Secondary | ICD-10-CM

## 2017-08-22 DIAGNOSIS — R0981 Nasal congestion: Secondary | ICD-10-CM | POA: Diagnosis not present

## 2017-08-22 MED ORDER — BENZONATATE 100 MG PO CAPS: 100.0000 mg | ORAL_CAPSULE | Freq: Three times a day (TID) | ORAL | 0 refills | Status: DC | PRN

## 2017-08-22 MED ORDER — FLUTICASONE PROPIONATE 50 MCG/ACT NA SUSP: 2.0000 | Freq: Every day | NASAL | 1 refills | Status: DC

## 2017-08-22 MED ORDER — AZITHROMYCIN 250 MG PO TABS: ORAL_TABLET | ORAL | 0 refills | Status: DC

## 2017-08-22 NOTE — Progress Notes (Signed)
Subjective:    Patient ID: Janice Zuniga, female    DOB: 1970-10-07, 46 y.o.   MRN: [id number]  HPI  Pt in stating early this week faint st, pnd, nasal congestion and now mild chest congestion.  Last 2 nights states feeling more tired than usual and productive cough increasing. Some chills at night as well..  Pt states started humera in spring. Rarely gets sick in past per pt.  Pt also will traveling on Monday for business trip.  LMP- hysterectomy.   Review of Systems  Constitutional: Positive for chills. Negative for fatigue and fever.       Mild chills last 2 nights.  HENT: Positive for congestion and postnasal drip. Negative for facial swelling, sinus pressure, sneezing, sore throat and trouble swallowing.   Respiratory: Negative for chest tightness, shortness of breath and wheezing.        Chest congested.  Cardiovascular: Negative for chest pain and palpitations.  Gastrointestinal: Negative for abdominal pain.  Musculoskeletal: Negative for back pain and joint swelling.  Skin: Negative for rash.  Neurological: Negative for dizziness, speech difficulty, weakness and light-headedness.  Hematological: Negative for adenopathy. Does not bruise/bleed easily.  Psychiatric/Behavioral: Negative for behavioral problems and confusion.    Past Medical History:  Diagnosis Date  . Abnormal CT of the abdomen   . Abnormal uterine bleeding   . Anemia    iron def  . Ankylosing spondylitis ([place])   . Chicken pox   . Colitis   . Crohn disease ([place])   . Dysrhythmia    occassional fluttering noted by patient  . Genital warts   . Gluten intolerance   . History of chlamydia   . History of hysterectomy 08/21/15  . HLA B27 (HLA B27 positive)   . HSV-1 (herpes simplex virus 1) infection   . Low calcium levels   . Low iron   . Low serum vitamin D   . Muscle spasms of both lower extremities    Spasms in lower back  . Non-celiac gluten sensitivity   . Reactive airway disease   .  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 UTI (lower urinary tract infection)      Social History   Socioeconomic History  . Marital status: Married    Spouse name: Not on file  . Number of children: Not on file  . Years of education: Not on file  . Highest education level: Not on file  Social Needs  . Financial resource strain: Not on file  . Food insecurity - worry: Not on file  . Food insecurity - inability: Not on file  . Transportation needs - medical: Not on file  . Transportation needs - non-medical: Not on file  Occupational History  . Not on file  Tobacco Use  . Smoking status: Never Smoker  . Smokeless tobacco: Never Used  Substance and Sexual Activity  . Alcohol use: Yes    Alcohol/week: 0.6 - 1.2 oz    Types: 1 Glasses of wine per week  . Drug use: No  . Sexual activity: Yes    Partners: Male    Comment: Hyst  Other Topics Concern  . 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 [name]. [name].    Got B.S. 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 ABDOMINAL HYSTERECTOMY  08/21/15  . BILATERAL SALPINGECTOMY Bilateral 08/21/2015   Procedure: BILATERAL SALPINGECTOMY;  Surgeon: Nunzio Cobbs, MD;  Location: [facility];  Service: Gynecology;  Laterality: Bilateral;  . COLONOSCOPY    . CYSTOSCOPY N/A 08/21/2015   Procedure: CYSTOSCOPY;  Surgeon: Nunzio Cobbs, MD;  Location: [facility];  Service: Gynecology;  Laterality: N/A;  . MOUTH SURGERY     cyst in mouth  . ROBOTIC ASSISTED TOTAL HYSTERECTOMY WITH SALPINGECTOMY Bilateral 08/21/2015   Procedure: ROBOTIC ASSISTED TOTAL HYSTERECTOMY ;  Surgeon: Nunzio Cobbs, MD;  Location: [facility];  Service: Gynecology;  Laterality:  Bilateral;  . WISDOM TOOTH EXTRACTION      Family History  Problem Relation Age of Onset  . Hypertension Mother   . Diabetes Mother        ? father  . Anxiety disorder Mother   . Cancer Paternal Grandmother        ? type  . Diabetes Father   . Colon polyps Father   . Prostate cancer Father     Allergies  Allergen Reactions  . Gluten Meal Other (See Comments)    Auto immune  . Tramadol     nausea  . Codeine Nausea And Vomiting  . Peanut-Containing Drug Products Other (See Comments)    By allergy test...has eaten peanuts without issues     Current Outpatient Medications on File Prior to Visit  Medication Sig Dispense Refill  . Adalimumab 40 MG/0.8ML PNKT Inject into the skin.    . calcium carbonate (TUMS - DOSED IN MG ELEMENTAL CALCIUM) 500 MG chewable tablet Chew 1 tablet by mouth daily.    . methotrexate (RHEUMATREX) 2.5 MG tablet Take 8 mg by mouth once a week.     Marland Kitchen PROAIR HFA 108 (90 BASE) MCG/ACT inhaler Inhale 2 puffs into the lungs every 4 (four) hours as needed. Reported on 03/29/2016     No current facility-administered medications on file prior to visit.     LMP 08/07/2015       Objective:   Physical Exam  General  Mental Status - Alert. General Appearance - Well groomed. Not in acute distress.  Skin Rashes- No Rashes.  HEENT Head- Normal. Ear Auditory Canal - Left- Normal. Right - Normal.Tympanic Membrane- Left- Normal. Right- Normal. Eye Sclera/Conjunctiva- Left- Normal. Right- Normal. Nose & Sinuses Nasal Mucosa- Left- not  Boggy and Congested. Right-  Not  Boggy and  Congested.Bilateral no  maxillary and no  frontal sinus pressure. Mouth & Throat Lips: Upper Lip- Normal: no dryness, cracking, pallor, cyanosis, or vesicular eruption. Lower Lip-Normal: no dryness, cracking, pallor, cyanosis or vesicular eruption. Buccal Mucosa- Bilateral- No Aphthous ulcers. Oropharynx- No Discharge or Erythema. Tonsils: Characteristics- Bilateral- No Erythema or  Congestion. Size/Enlargement- Bilateral- No enlargement. Discharge- bilateral-None.  Neck Neck- Supple. No Masses.   Chest and Lung Exam Auscultation: Breath Sounds:-Clear even and unlabored.  Cardiovascular Auscultation:Rythm- Regular, rate and rhythm. Murmurs & Other Heart Sounds:Ausculatation of the heart reveal- No Murmurs.  Lymphatic Head & Neck General Head & Neck Lymphatics: Bilateral: Description- No Localized lymphadenopathy.         Assessment & Plan:  You appear to have bronchitis. Rest hydrate and tylenol for fever. I am prescribing cough medicine benzonatate, and azithromycin  antibiotic. For associated nasal congestion can use flonase nasal steroid.   You should  gradually get better. If not then notify us and would recommend a chest xray.  Follow specialist guidelines and hold humera. If in future recurrent infections occur would notity your specialist and inform of frequency.  Follow up in 7-10 days or as needed  No in light of upcoming weekend, business trip out of town for the next week and recent use of Humira, I do think it is best for her to start antibiotic.  Patient expressed understanding.  Laquonda Welby, Percell Miller, PA-C

## 2017-08-22 NOTE — Patient Instructions (Addendum)
You appear to have bronchitis. Rest hydrate and tylenol for fever. I am prescribing cough medicine benzonatate, and azithromycin  antibiotic. For associated  nasal congestion can use flonase nasal steroid.   You should gradually get better. If not then notify us and would recommend a chest xray.  Follow specialist guidelines and hold humera. If in future recurrent infections occur would notity your specialist and inform of frequency  Follow up in 7-10 days or as needed

## 2017-08-25 ENCOUNTER — Ambulatory Visit: Payer: Federal, State, Local not specified - PPO | Admitting: Psychology

## 2017-09-26 ENCOUNTER — Ambulatory Visit: Payer: Self-pay | Admitting: Psychology

## 2017-09-30 ENCOUNTER — Ambulatory Visit: Payer: Federal, State, Local not specified - PPO | Admitting: Psychology

## 2017-09-30 DIAGNOSIS — F4323 Adjustment disorder with mixed anxiety and depressed mood: Secondary | ICD-10-CM

## 2017-10-06 DIAGNOSIS — M461 Sacroiliitis, not elsewhere classified: Secondary | ICD-10-CM | POA: Diagnosis not present

## 2017-10-06 DIAGNOSIS — Z91018 Allergy to other foods: Secondary | ICD-10-CM | POA: Diagnosis not present

## 2017-10-06 DIAGNOSIS — Z885 Allergy status to narcotic agent status: Secondary | ICD-10-CM | POA: Diagnosis not present

## 2017-10-06 DIAGNOSIS — Z90710 Acquired absence of both cervix and uterus: Secondary | ICD-10-CM | POA: Diagnosis not present

## 2017-10-06 DIAGNOSIS — Z681 Body mass index (BMI) 19 or less, adult: Secondary | ICD-10-CM | POA: Diagnosis not present

## 2017-10-06 DIAGNOSIS — Z79899 Other long term (current) drug therapy: Secondary | ICD-10-CM | POA: Diagnosis not present

## 2017-10-06 DIAGNOSIS — Z91010 Allergy to peanuts: Secondary | ICD-10-CM | POA: Diagnosis not present

## 2017-10-06 DIAGNOSIS — Z09 Encounter for follow-up examination after completed treatment for conditions other than malignant neoplasm: Secondary | ICD-10-CM | POA: Diagnosis not present

## 2017-10-06 DIAGNOSIS — Z862 Personal history of diseases of the blood and blood-forming organs and certain disorders involving the immune mechanism: Secondary | ICD-10-CM | POA: Diagnosis not present

## 2017-10-06 DIAGNOSIS — R7989 Other specified abnormal findings of blood chemistry: Secondary | ICD-10-CM | POA: Diagnosis not present

## 2017-10-06 DIAGNOSIS — Y33XXXA Other specified events, undetermined intent, initial encounter: Secondary | ICD-10-CM | POA: Diagnosis not present

## 2017-10-06 DIAGNOSIS — Z1589 Genetic susceptibility to other disease: Secondary | ICD-10-CM | POA: Diagnosis not present

## 2017-10-06 DIAGNOSIS — H209 Unspecified iridocyclitis: Secondary | ICD-10-CM | POA: Diagnosis not present

## 2017-10-06 DIAGNOSIS — K5080 Crohn's disease of both small and large intestine without complications: Secondary | ICD-10-CM | POA: Diagnosis not present

## 2017-10-06 DIAGNOSIS — R768 Other specified abnormal immunological findings in serum: Secondary | ICD-10-CM | POA: Diagnosis not present

## 2017-10-06 DIAGNOSIS — Z23 Encounter for immunization: Secondary | ICD-10-CM | POA: Diagnosis not present

## 2017-10-06 DIAGNOSIS — M450 Ankylosing spondylitis of multiple sites in spine: Secondary | ICD-10-CM | POA: Diagnosis not present

## 2017-10-06 DIAGNOSIS — S99922A Unspecified injury of left foot, initial encounter: Secondary | ICD-10-CM | POA: Diagnosis not present

## 2017-10-06 DIAGNOSIS — M459 Ankylosing spondylitis of unspecified sites in spine: Secondary | ICD-10-CM | POA: Diagnosis not present

## 2017-11-09 IMAGING — MR MR [PERSON_NAME] ABD W/CM
10 of 16 series · 23 of 48 positions shown · IV contrast (Yes)
Comparison: None.

CLINICAL DATA: Crohn's disease.  Iron deficiency anemia.

EXAM:
MR ABDOMEN AND PELVIS WITHOUT AND WITH CONTRAST (MR ENTEROGRAPHY)
TECHNIQUE: Multiplanar, multisequence MRI of the abdomen and pelvis was
performed both before and during bolus administration of intravenous
contrast. Negative oral contrast VoLumen was given.
CONTRAST:  9mL MULTIHANCE GADOBENATE DIMEGLUMINE 529 MG/ML IV SOLN

[Series 3: T2 · axial · 5.0mm · 0.70mm/px · z∈[-180,+190]mm · 2 of 75 slices shown (1 of 2)]
[im 1/75]
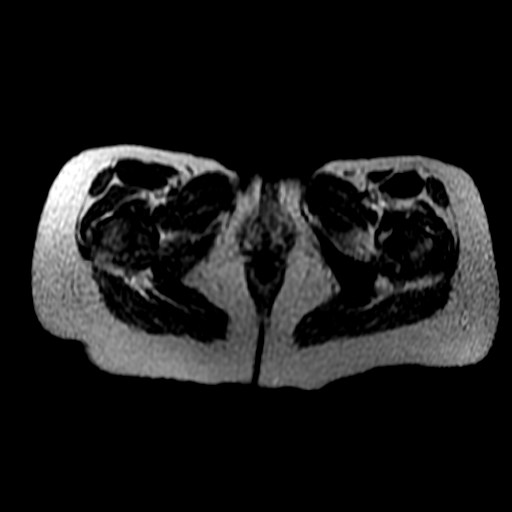
[im 75/75]
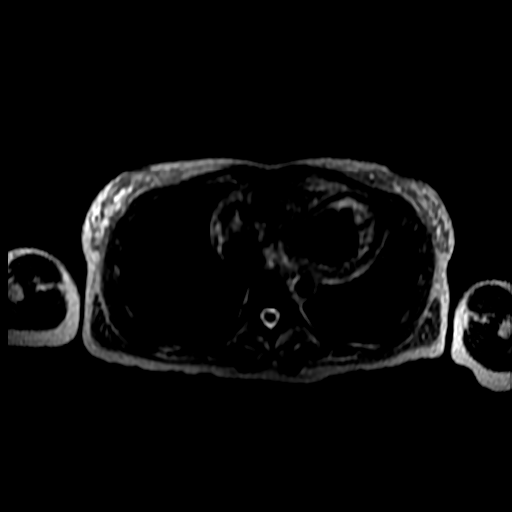

[Series 4: T2 · coronal · 5.0mm · 0.70mm/px · 1 of 35 slices shown (2 of 2)]
[im 1/35]
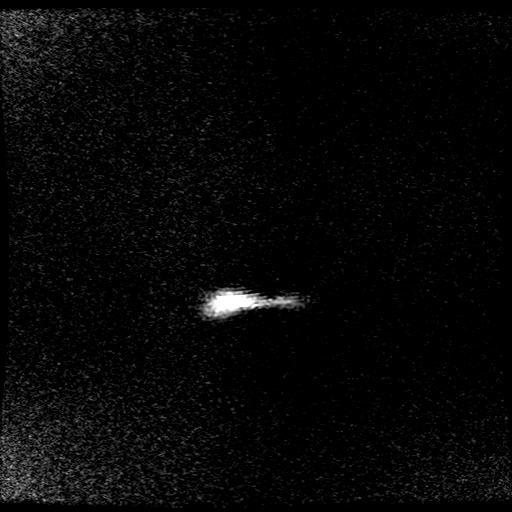

[Series 5: DWI b500 · axial · 6.0mm · 1.33mm/px · z∈[-205,+200]mm · 2 of 82 slices shown]
[im 1/82]
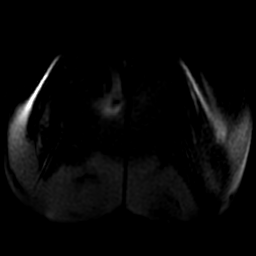
[im 82/82]
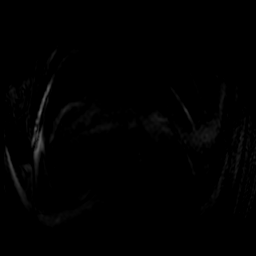

[Series 6: bSSFP · coronal · 5.0mm · 0.70mm/px · 1 of 35 slices shown]
[im 1/35]
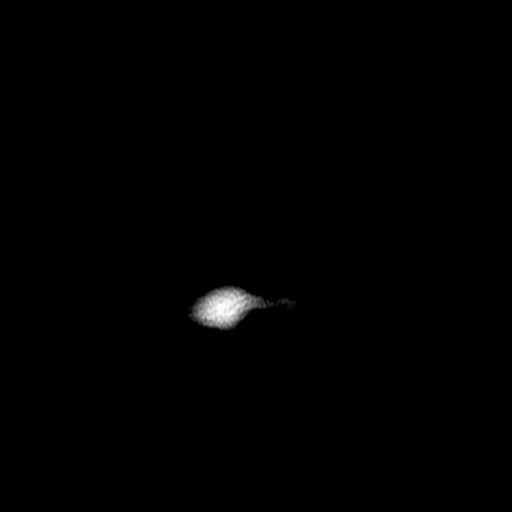

[Series 8: T1 dynamic · coronal · delayed · 7.0mm · 0.78mm/px · 2 of 72 slices shown (1 of 4)]
[im 1/72]
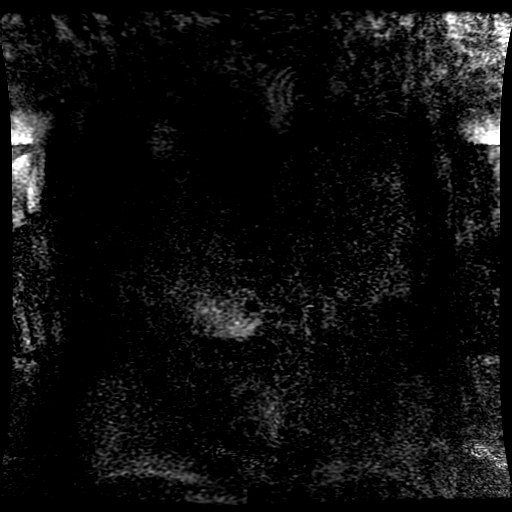
[im 72/72]
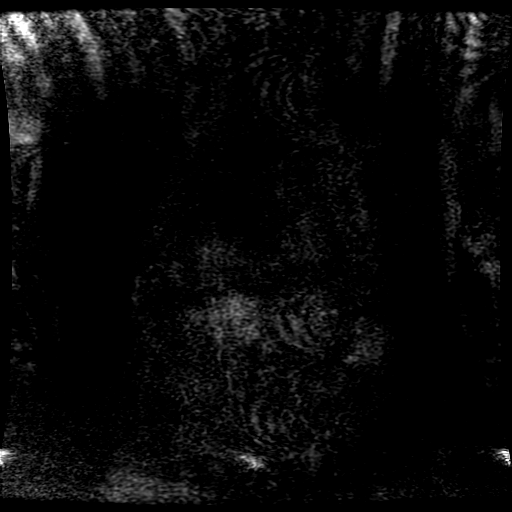

[Series 10: T1 dynamic post-contrast · axial · 6.4mm · 1.56mm/px · z∈[-157,+182]mm · 2 of 54 slices shown]
[im 1/54]
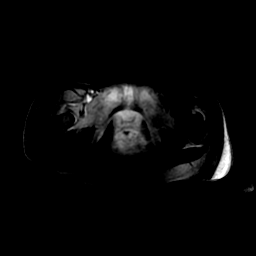
[im 54/54]
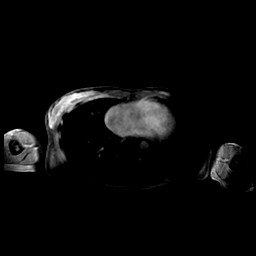

[Series 500: DWI · axial · 6.0mm · 1.33mm/px · z∈[-205,+200]mm · 2 of 49 slices shown]
[im 1/49]
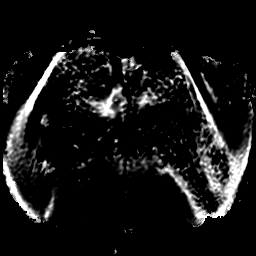
[im 49/49]
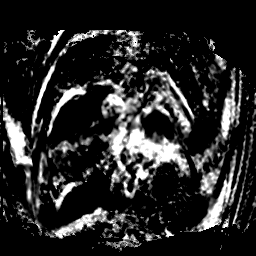

[Series 700: T1 dynamic · axial · 6.4mm · 0.78mm/px · z∈[-162,+194]mm · 4 of 112 slices shown (2 of 4)]
[im 1/112]
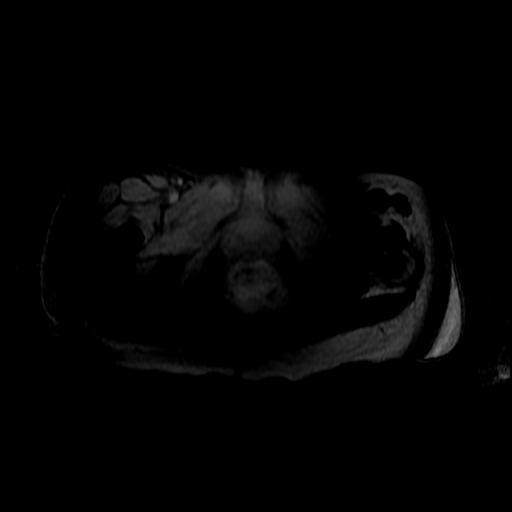
[im 38/112]
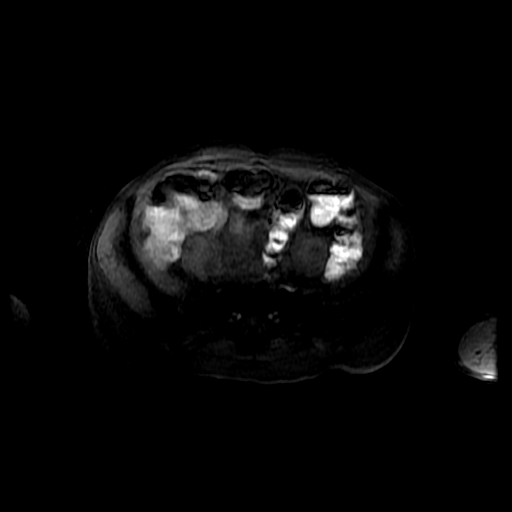
[im 75/112]
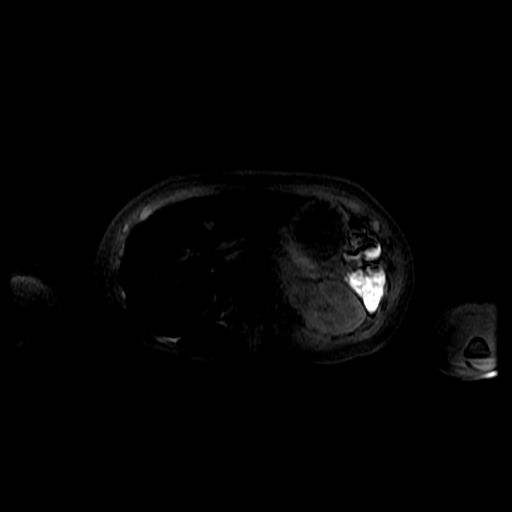
[im 112/112]
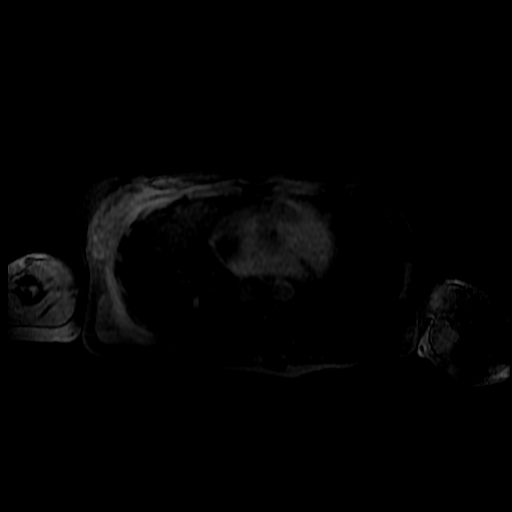

[Series 701: T1 dynamic · axial · 6.4mm · 0.78mm/px · z∈[-162,+194]mm · 4 of 112 slices shown (3 of 4)]
[im 1/112]
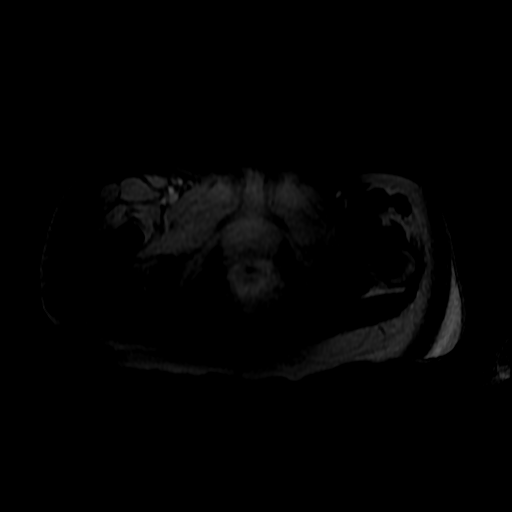
[im 38/112]
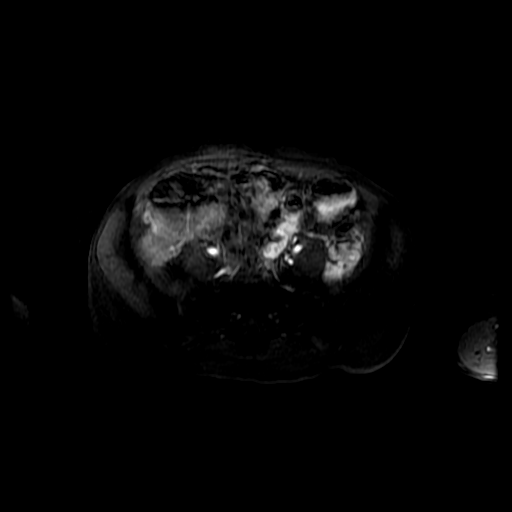
[im 75/112]
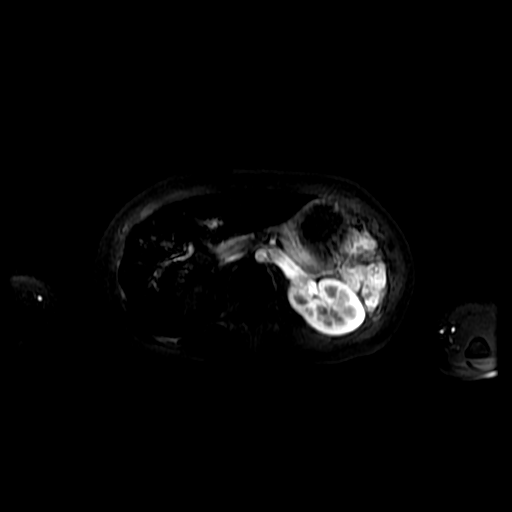
[im 112/112]
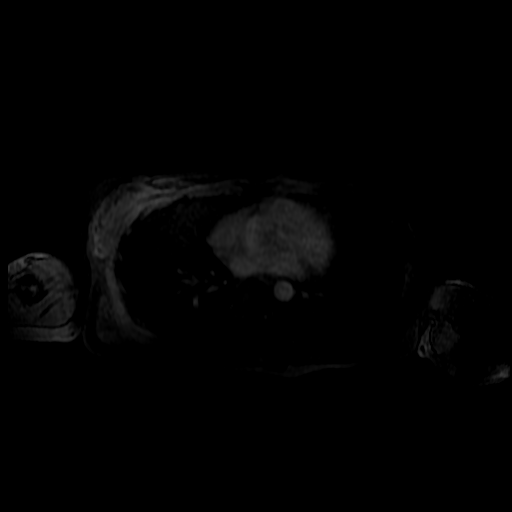

[Series 702: T1 dynamic · axial · 6.4mm · 0.78mm/px · z∈[-162,+75]mm · 3 of 112 slices shown (4 of 4)]
[im 1/112]
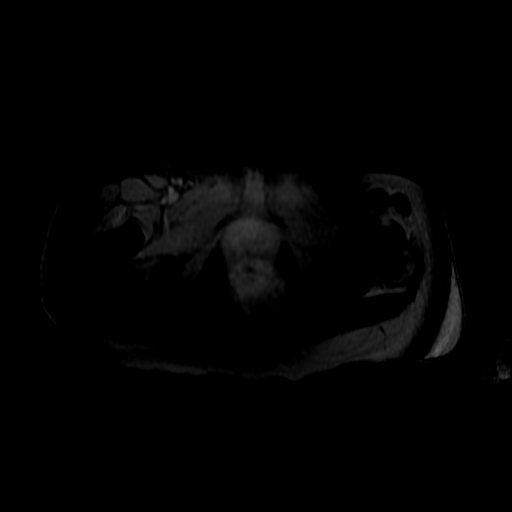
[im 38/112]
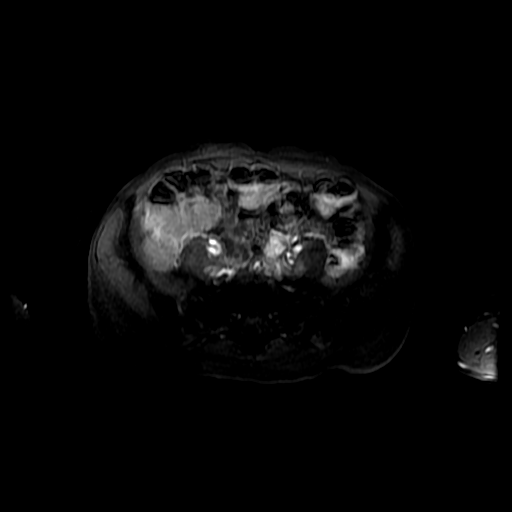
[im 75/112]
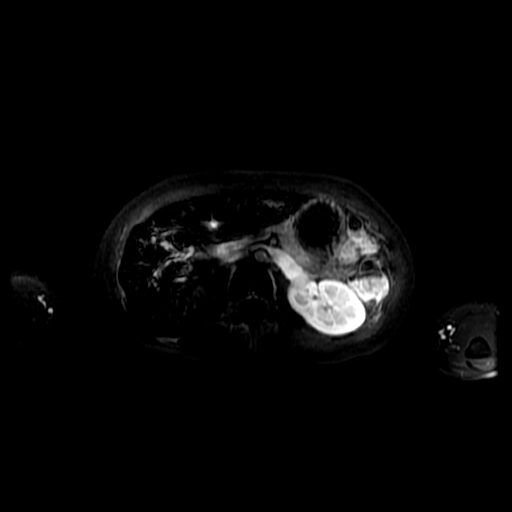

[23 of 48 positions shown; findings below may reference images not displayed]

FINDINGS: COMBINED FINDINGS FOR BOTH MR ABDOMEN AND PELVIS

Lower Chest: No acute findings.

Hepatobiliary: Diffuse T2 hypointensity, consistent with
hemosiderosis. Scattered tiny fluid intensity cysts are seen, with
largest in the left hepatic lobe measuring 9 mm. No liver masses
identified. Gallbladder is unremarkable.

Pancreas: No mass or inflammatory changes. Normal signal intensity.

Spleen: Diffuse T2 hypointensity, consistent with hemosiderosis. No
evidence of splenomegaly or splenic lesions.

Adrenals/Urinary Tract: No masses identified. No evidence of
hydronephrosis.

Stomach/Bowel: No evidence of bowel wall thickening or dilatation.
No evidence of abnormal mucosal enhancement or other inflammatory
changes. No abscess identified. The terminal ileum is normal in
appearance. No other areas of bowel wall thickening identified.

Vascular/Lymphatic: No pathologically enlarged lymph nodes. No
abdominal aortic aneurysm.

Reproductive: Previous hysterectomy. Adnexal regions are
unremarkable.

Other:  None.

Musculoskeletal: No suspicious bone lesions identified. Diffuse bone
marrow T2 hypointensity, consistent with hemosiderosis.
IMPRESSION: No radiographic evidence of active inflammatory bowel disease or
other acute findings.

Findings consistent with transfusional hemosiderosis.

## 2017-11-10 ENCOUNTER — Ambulatory Visit: Payer: Federal, State, Local not specified - PPO | Admitting: Psychology

## 2017-11-10 DIAGNOSIS — F4323 Adjustment disorder with mixed anxiety and depressed mood: Secondary | ICD-10-CM

## 2017-12-16 ENCOUNTER — Encounter: Payer: Self-pay | Admitting: Family

## 2017-12-16 ENCOUNTER — Ambulatory Visit (INDEPENDENT_AMBULATORY_CARE_PROVIDER_SITE_OTHER): Payer: Federal, State, Local not specified - PPO | Admitting: Family

## 2017-12-16 VITALS — BP 114/76 | HR 69 | Temp 98.6°F | Resp 16 | Ht 63.5 in | Wt 104.0 lb

## 2017-12-16 DIAGNOSIS — N63 Unspecified lump in unspecified breast: Secondary | ICD-10-CM

## 2017-12-16 DIAGNOSIS — K5010 Crohn's disease of large intestine without complications: Secondary | ICD-10-CM | POA: Diagnosis not present

## 2017-12-16 DIAGNOSIS — Z0001 Encounter for general adult medical examination with abnormal findings: Secondary | ICD-10-CM | POA: Diagnosis not present

## 2017-12-16 DIAGNOSIS — Z0000 Encounter for general adult medical examination without abnormal findings: Secondary | ICD-10-CM

## 2017-12-16 LAB — CBC WITH DIFFERENTIAL/PLATELET
BASOS PCT: 0.6 % (ref 0.0–3.0)
Basophils Absolute: 0 10*3/uL (ref 0.0–0.1)
EOS PCT: 1.1 % (ref 0.0–5.0)
Eosinophils Absolute: 0.1 10*3/uL (ref 0.0–0.7)
HEMATOCRIT: 39.3 % (ref 36.0–46.0)
HEMOGLOBIN: 13.4 g/dL (ref 12.0–15.0)
LYMPHS PCT: 30.7 % (ref 12.0–46.0)
Lymphs Abs: 2.5 10*3/uL (ref 0.7–4.0)
MCHC: 34.1 g/dL (ref 30.0–36.0)
MCV: 87.9 fl (ref 78.0–100.0)
MONO ABS: 0.5 10*3/uL (ref 0.1–1.0)
Monocytes Relative: 6.1 % (ref 3.0–12.0)
Neutro Abs: 4.9 10*3/uL (ref 1.4–7.7)
Neutrophils Relative %: 61.5 % (ref 43.0–77.0)
Platelets: 194 10*3/uL (ref 150.0–400.0)
RBC: 4.48 Mil/uL (ref 3.87–5.11)
RDW: 13.1 % (ref 11.5–15.5)
WBC: 8 10*3/uL (ref 4.0–10.5)

## 2017-12-16 LAB — BASIC METABOLIC PANEL
BUN: 14 mg/dL (ref 6–23)
CALCIUM: 9 mg/dL (ref 8.4–10.5)
CO2: 27 mEq/L (ref 19–32)
CREATININE: 0.51 mg/dL (ref 0.40–1.20)
Chloride: 104 mEq/L (ref 96–112)
GFR: 165.99 mL/min (ref >60.00)
GLUCOSE: 94 mg/dL (ref 70–99)
Potassium: 4.4 mEq/L (ref 3.5–5.1)
Sodium: 137 mEq/L (ref 135–145)

## 2017-12-16 LAB — URINALYSIS, ROUTINE W REFLEX MICROSCOPIC
Bilirubin Urine: NEGATIVE
Hgb urine dipstick: NEGATIVE
Leukocytes, UA: NEGATIVE
Nitrite: NEGATIVE
RBC / HPF: NONE SEEN (ref 0–?)
Specific Gravity, Urine: 1.02 (ref 1.000–1.030)
TOTAL PROTEIN, URINE-UPE24: NEGATIVE
URINE GLUCOSE: NEGATIVE
UROBILINOGEN UA: 0.2 (ref 0.0–1.0)
pH: 6 (ref 5.0–8.0)

## 2017-12-16 LAB — HEPATIC FUNCTION PANEL
ALT: 29 U/L (ref 0–35)
AST: 32 U/L (ref 0–37)
Albumin: 3.9 g/dL (ref 3.5–5.2)
Alkaline Phosphatase: 63 U/L (ref 39–117)
BILIRUBIN DIRECT: 0.2 mg/dL (ref 0.0–0.3)
BILIRUBIN TOTAL: 0.9 mg/dL (ref 0.2–1.2)
Total Protein: 6.8 g/dL (ref 6.0–8.3)

## 2017-12-16 LAB — C-REACTIVE PROTEIN: CRP: 0.1 mg/dL — ABNORMAL LOW (ref 0.5–20.0)

## 2017-12-16 LAB — LIPID PANEL
CHOL/HDL RATIO: 3
CHOLESTEROL: 140 mg/dL (ref 0–200)
HDL: 51.5 mg/dL (ref >39.00)
LDL CALC: 80 mg/dL (ref 0–99)
NonHDL: 88.7
Triglycerides: 43 mg/dL (ref 0.0–149.0)
VLDL: 8.6 mg/dL (ref 0.0–40.0)

## 2017-12-16 LAB — TSH: TSH: 0.6 u[IU]/mL (ref 0.35–4.50)

## 2017-12-16 NOTE — Patient Instructions (Addendum)
Schedule routine dental exam. Complete lab work prior to leaving. You should be contacted about scheduling your breast imaging- please let me know if you have not heard back in 1 week about this appointment.

## 2017-12-16 NOTE — Progress Notes (Signed)
Subjective:    Patient ID: Janice Zuniga, female    DOB: July 26, 1971, 47 y.o.   MRN: [id number]  HPI  Patient presents today for complete physical.  Immunizations: tdap 2011, had a flu shot Diet: healthy plant based diet Exercise: 5 days a week Pap Smear: hysterectomy Mammogram: 7/17 Vision: up to date Dental: due   Crohns- saw rheumatology and was placed on humira for ankylosing spondylitis. She reports that this has helped. She stopped humira in January. Changed to a plant based diet and feels really well.  She saw a GI doctor at [facility] as well for Crohns.    Review of Systems  Constitutional: Negative for unexpected weight change.  HENT: Negative for hearing loss and rhinorrhea.   Eyes: Negative for visual disturbance.  Respiratory: Negative for cough.   Cardiovascular: Negative for leg swelling.  Gastrointestinal: Negative for blood in stool, constipation and diarrhea.  Genitourinary: Negative for dysuria, frequency and hematuria.  Musculoskeletal: Negative for arthralgias and back pain.  Skin: Negative for rash.  Neurological: Negative for headaches.  Hematological: Negative for adenopathy.  Psychiatric/Behavioral:       Depession/anxiety   Past Medical History:  Diagnosis Date  . Abnormal CT of the abdomen   . Abnormal uterine bleeding   . Anemia    iron def  . Ankylosing spondylitis ([place])   . Chicken pox   . Colitis   . Crohn disease ([place])   . Dysrhythmia    occassional fluttering noted by patient  . Genital warts   . Gluten intolerance   . History of chlamydia   . History of hysterectomy 08/21/15  . HLA B27 (HLA B27 positive)   . HSV-1 (herpes simplex virus 1) infection   . Low calcium levels   . Low iron   . Low serum vitamin D   . Muscle spasms of both lower extremities    Spasms in lower back  . Non-celiac gluten sensitivity   . Reactive airway disease   . Spondyloarthritis    genetic marker  . UTI (lower urinary tract infection)        Social History   Socioeconomic History  . Marital status: Married    Spouse name: Not on file  . Number of children: Not on file  . Years of education: Not on file  . Highest education level: Not on file  Occupational History  . Not on file  Social Needs  . Financial resource strain: Not on file  . Food insecurity:    Worry: Not on file    Inability: Not on file  . Transportation needs:    Medical: Not on file    Non-medical: Not on file  Tobacco Use  . Smoking status: Never Smoker  . Smokeless tobacco: Never Used  Substance and Sexual Activity  . Alcohol use: Yes    Alcohol/week: 0.6 - 1.2 oz    Types: 1 Glasses of wine per week  . Drug use: No  . Sexual activity: Yes    Partners: Male    Comment: Hyst  Lifestyle  . Physical activity:    Days per week: Not on file    Minutes per session: Not on file  . Stress: Not on file  Relationships  . Social connections:    Talks on phone: Not on file    Gets together: Not on file    Attends religious service: Not on file    Active member of club or organization: Not on file  Attends meetings of clubs or organizations: Not on file    Relationship status: Not on file  . Intimate partner violence:    Fear of current or ex partner: Not on file    Emotionally abused: Not on file    Physically abused: Not on file    Forced sexual activity: Not on file  Other Topics Concern  . 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 [name]. [name].    Got B.S. 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 ABDOMINAL HYSTERECTOMY  08/21/15  . BILATERAL SALPINGECTOMY Bilateral 08/21/2015   Procedure: BILATERAL SALPINGECTOMY;  Surgeon: Nunzio Cobbs, MD;  Location: [facility];   Service: Gynecology;  Laterality: Bilateral;  . COLONOSCOPY    . CYSTOSCOPY N/A 08/21/2015   Procedure: CYSTOSCOPY;  Surgeon: Nunzio Cobbs, MD;  Location: [facility];  Service: Gynecology;  Laterality: N/A;  . MOUTH SURGERY     cyst in mouth  . ROBOTIC ASSISTED TOTAL HYSTERECTOMY WITH SALPINGECTOMY Bilateral 08/21/2015   Procedure: ROBOTIC ASSISTED TOTAL HYSTERECTOMY ;  Surgeon: Nunzio Cobbs, MD;  Location: [facility];  Service: Gynecology;  Laterality: Bilateral;  . WISDOM TOOTH EXTRACTION      Family History  Problem Relation Age of Onset  . Hypertension Mother   . Diabetes Mother        ? father  . Anxiety disorder Mother   . Cancer Paternal Grandmother        ? type  . Diabetes Father   . Colon polyps Father   . Prostate cancer Father     Allergies  Allergen Reactions  . Gluten Meal Other (See Comments)    Auto immune  . Tramadol     nausea  . Codeine Nausea And Vomiting  . Peanut-Containing Drug Products Other (See Comments)    By allergy test...has eaten peanuts without issues     Current Outpatient Medications on File Prior to Visit  Medication Sig Dispense Refill  . Adalimumab 40 MG/0.8ML PNKT Inject into the skin.    . calcium carbonate (TUMS - DOSED IN MG ELEMENTAL CALCIUM) 500 MG chewable tablet Chew 1 tablet by mouth daily.    . fluticasone (FLONASE) 50 MCG/ACT nasal spray Place 2 sprays into both nostrils daily. (Patient not taking: Reported on 12/16/2017) 16 g 1  . methotrexate (RHEUMATREX) 2.5 MG tablet Take 8 mg by mouth once a week.     Marland Kitchen PROAIR HFA 108 (90 BASE) MCG/ACT inhaler Inhale 2 puffs into the lungs every 4 (four) hours as needed. Reported on 03/29/2016     No current facility-administered medications on file prior to visit.     BP 114/76 (BP Location: Left Arm, Patient Position: Sitting, Cuff Size: Small)   Pulse 69   Temp 98.6 F (37 C) (Oral)   Resp 16   Ht 5' 3.5" (1.613 m)   Wt 104 lb (47.2 kg)   LMP 08/07/2015   SpO2  100%   BMI 18.13 kg/m       Objective:   Physical Exam  Physical Exam  Constitutional: She is oriented to person, place, and time. She appears well-developed and well-nourished.  No distress.  HENT:  Head: Normocephalic and atraumatic.  Right Ear: Tympanic membrane and ear canal normal.  Left Ear: Tympanic membrane and ear canal normal.  Mouth/Throat: Oropharynx is clear and moist.  Eyes: Pupils are equal, round, and reactive to light. No scleral icterus.  Neck: Normal range of motion. No thyromegaly present.  Cardiovascular: Normal rate and regular rhythm.   No murmur heard. Pulmonary/Chest: Effort normal and breath sounds normal. No respiratory distress. He has no wheezes. She has no rales. She exhibits no tenderness.  Abdominal: Soft. Bowel sounds are normal. She exhibits no distension and no mass. There is no tenderness. There is no rebound and no guarding.  Musculoskeletal: She exhibits no edema.  Lymphadenopathy:    She has no cervical adenopathy.  Neurological: She is alert and oriented to person, place, and time. She has normal patellar reflexes. She exhibits normal muscle tone. Coordination normal.  Skin: Skin is warm and dry.  Psychiatric: She has a normal mood and affect. Her behavior is normal. Judgment and thought content normal.  Breasts: Examined lying Right: Without masses, retractions, discharge or axillary adenopathy.  Left: pea sized mass noted at 3 oclock adjacent to areolo     Assessment & Plan:          Assessment & Plan:  Preventative care- discussed continuing healthy diet and regular exercise.  EKG tracing is personally reviewed.  EKG notes NSR.  No acute changes.    Left breast mass- send for diagnostic mammo and [place].

## 2017-12-21 ENCOUNTER — Other Ambulatory Visit: Payer: Self-pay | Admitting: Family

## 2017-12-22 ENCOUNTER — Ambulatory Visit
Admission: RE | Admit: 2017-12-22 | Discharge: 2017-12-22 | Disposition: A | Discharge status: Home / Self Care | Payer: Federal, State, Local not specified - PPO | Source: Ambulatory Visit | Attending: Family | Admitting: Family

## 2017-12-22 DIAGNOSIS — N63 Unspecified lump in unspecified breast: Secondary | ICD-10-CM

## 2017-12-22 DIAGNOSIS — N6002 Solitary cyst of left breast: Secondary | ICD-10-CM | POA: Diagnosis not present

## 2017-12-22 DIAGNOSIS — R928 Other abnormal and inconclusive findings on diagnostic imaging of breast: Secondary | ICD-10-CM | POA: Diagnosis not present

## 2018-01-04 DIAGNOSIS — R0789 Other chest pain: Secondary | ICD-10-CM | POA: Diagnosis not present

## 2018-01-05 ENCOUNTER — Ambulatory Visit: Payer: Federal, State, Local not specified - PPO | Admitting: Psychology

## 2018-01-05 DIAGNOSIS — M450 Ankylosing spondylitis of multiple sites in spine: Secondary | ICD-10-CM | POA: Diagnosis not present

## 2018-01-05 DIAGNOSIS — Z79899 Other long term (current) drug therapy: Secondary | ICD-10-CM | POA: Diagnosis not present

## 2018-01-05 DIAGNOSIS — S99922A Unspecified injury of left foot, initial encounter: Secondary | ICD-10-CM | POA: Diagnosis not present

## 2018-01-05 DIAGNOSIS — Z1589 Genetic susceptibility to other disease: Secondary | ICD-10-CM | POA: Diagnosis not present

## 2018-04-08 DIAGNOSIS — M450 Ankylosing spondylitis of multiple sites in spine: Secondary | ICD-10-CM | POA: Diagnosis not present

## 2018-04-08 DIAGNOSIS — Z1589 Genetic susceptibility to other disease: Secondary | ICD-10-CM | POA: Diagnosis not present

## 2018-04-08 DIAGNOSIS — Z23 Encounter for immunization: Secondary | ICD-10-CM | POA: Diagnosis not present

## 2018-04-08 DIAGNOSIS — Z79899 Other long term (current) drug therapy: Secondary | ICD-10-CM | POA: Diagnosis not present

## 2018-04-08 DIAGNOSIS — K50819 Crohn's disease of both small and large intestine with unspecified complications: Secondary | ICD-10-CM | POA: Diagnosis not present

## 2018-04-15 ENCOUNTER — Other Ambulatory Visit: Payer: Self-pay

## 2018-04-15 ENCOUNTER — Telehealth: Payer: Self-pay | Admitting: Obstetrics and Gynecology

## 2018-04-15 ENCOUNTER — Ambulatory Visit: Payer: Federal, State, Local not specified - PPO | Admitting: Obstetrics and Gynecology

## 2018-04-15 ENCOUNTER — Encounter: Payer: Self-pay | Admitting: Obstetrics and Gynecology

## 2018-04-15 VITALS — BP 102/60 | HR 76 | Resp 14 | Ht 65.0 in | Wt 106.0 lb

## 2018-04-15 DIAGNOSIS — R350 Frequency of micturition: Secondary | ICD-10-CM

## 2018-04-15 DIAGNOSIS — N9089 Other specified noninflammatory disorders of vulva and perineum: Secondary | ICD-10-CM | POA: Diagnosis not present

## 2018-04-15 DIAGNOSIS — R5381 Other malaise: Secondary | ICD-10-CM | POA: Diagnosis not present

## 2018-04-15 LAB — POCT URINALYSIS DIPSTICK
BILIRUBIN UA: NEGATIVE
GLUCOSE UA: NEGATIVE
KETONES UA: NEGATIVE
Leukocytes, UA: NEGATIVE
Nitrite, UA: NEGATIVE
Protein, UA: NEGATIVE
RBC UA: NEGATIVE
Urobilinogen, UA: 0.2 E.U./dL
pH, UA: 5 (ref 5.0–8.0)

## 2018-04-15 MED ORDER — VALACYCLOVIR HCL 1 G PO TABS: 1000.0000 mg | ORAL_TABLET | Freq: Two times a day (BID) | ORAL | 1 refills | Status: DC

## 2018-04-15 NOTE — Telephone Encounter (Signed)
Patient stated that she is having an outbreak that she is not sure the cause of. She is currently taking Humira and is concerned that it could be from a suppressed immune system.

## 2018-04-15 NOTE — Telephone Encounter (Signed)
Spoke with patient. Reports a blister on vulva that has become more "irritating", thinks this is HSV. Hx of HSV1, no current meds, has not experienced outbreak "in years". Increased fatigue, lower abdominal tenderness and fatigue for the last 2 days. Is on Humira, has concerns about med and HSV. Denies fever/chills, vaginal bleeding, d/c or odor.   OV scheduled for today at 10am with Dr. Quincy Simmonds.   Routing to provider for final review. Patient is agreeable to disposition. Will close encounter.

## 2018-04-15 NOTE — [place] Notes (Signed)
GYNECOLOGY  VISIT   HPI: 47 y.o.   Married  [place] American  female   [phone] with Patient's last menstrual period was 08/07/2015.   here for HSV outbreak -- urinary symptoms of frequency and night urination     Noticed a blister area on her labia this week.  Almost gone now.   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  Rare outbreak.  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  Urinary frequency last hs several times.  This was unusual.   Yesterday did not feel well.  Exhausted. Has some muscle aching but did an increased work out.  No fevers.   Urine: Negative  GYNECOLOGIC HISTORY: Patient's last menstrual period was 08/07/2015. Contraception:  Hysterectomy Menopausal hormone therapy:  none Last mammogram:  12/22/17 MM/Left Breast US - BIRADS 2 benign/density c Last pap smear:   03-31-15 Neg:Neg HR HPV; 2014 neg        OB History    Gravida  5   Para  3   Term  3   Preterm      AB  2   Living  3     SAB  1   TAB  1   Ectopic      Multiple      Live Births                 Patient Active Problem List   Diagnosis Date Noted  . Crohn disease ([place]) 02/18/2017  . History of uveitis 02/18/2017  . Ankylosing spondylitis of multiple sites in spine ([place]) 01/29/2017  . Encounter for long-term (current) use of high-risk medication 01/29/2017  . Abnormal loss of weight 11/02/2016  . Crohn's disease of both small and large intestine with complication ([place]) 84/69/6295  . Elevated ferritin level 11/01/2016  . History of colitis 03/29/2016  . Elevated serum globulin level 12/29/2015  . Depression 09/15/2015  . Vitamin D deficiency 09/08/2015  . Anemia 09/08/2015  . Status post laparoscopic hysterectomy 08/21/2015  . Low calcium levels 07/14/2015  . Abnormal CT of the abdomen 04/24/2015  . Hematochezia 04/24/2015  . 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 Extrinsic asthma 07/12/2014  . Iron deficiency anemia 07/11/2014  . HLA B27 positive 07/06/2014   . Low serum vitamin D   . Non-celiac gluten sensitivity   . 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 Abnormal CT of the abdomen   . Abnormal uterine bleeding   . Anemia    iron def  . Ankylosing spondylitis ([place])   . Chicken pox   . Colitis   . Crohn disease ([place])   . Dysrhythmia    occassional fluttering noted by patient  . Genital warts   . Gluten intolerance   . History of chlamydia   . History of hysterectomy 08/21/15  . HLA B27 (HLA B27 positive)   . HSV-1 (herpes simplex virus 1) infection   . Low calcium levels   . Low iron   . Low serum vitamin D   . Muscle spasms of both lower extremities    Spasms in lower back  . Non-celiac gluten sensitivity   . Reactive airway disease   . Spondyloarthritis    genetic marker  . UTI (lower urinary tract infection)     Past Surgical History:  Procedure Laterality Date  . ABDOMINAL HYSTERECTOMY  08/21/15  . BILATERAL SALPINGECTOMY Bilateral 08/21/2015   Procedure: BILATERAL SALPINGECTOMY;  Surgeon: Jamey Reas  Lorenz Coaster, MD;  Location: [facility];  Service: Gynecology;  Laterality: Bilateral;  . COLONOSCOPY    . CYSTOSCOPY N/A 08/21/2015   Procedure: CYSTOSCOPY;  Surgeon: Nunzio Cobbs, MD;  Location: [facility];  Service: Gynecology;  Laterality: N/A;  . MOUTH SURGERY     cyst in mouth  . ROBOTIC ASSISTED TOTAL HYSTERECTOMY WITH SALPINGECTOMY Bilateral 08/21/2015   Procedure: ROBOTIC ASSISTED TOTAL HYSTERECTOMY ;  Surgeon: Nunzio Cobbs, MD;  Location: [facility];  Service: Gynecology;  Laterality: Bilateral;  . WISDOM TOOTH EXTRACTION      Current Outpatient Medications  Medication Sig Dispense Refill  . Adalimumab 40 MG/0.8ML PNKT Inject into the skin.    . folic acid (FOLVITE) 1 MG tablet Take 1 tablet by mouth daily.    . methotrexate (RHEUMATREX) 2.5 MG tablet Take 8 mg by mouth once a week.     Marland Kitchen PROAIR HFA 108 (90 BASE) MCG/ACT inhaler Inhale 2 puffs into the lungs  every 4 (four) hours as needed. Reported on 03/29/2016     No current facility-administered medications for this visit.      ALLERGIES: Gluten meal; Tramadol; Codeine; and Peanut-containing drug products  Family History  Problem Relation Age of Onset  . Hypertension Mother   . Diabetes Mother        ? father  . Anxiety disorder Mother   . Cancer Paternal Grandmother        ? type  . Diabetes Father   . Colon polyps Father   . Prostate cancer Father     Social History   Socioeconomic History  . Marital status: Married    Spouse name: Not on file  . Number of children: Not on file  . Years of education: Not on file  . Highest education level: Not on file  Occupational History  . Not on file  Social Needs  . Financial resource strain: Not on file  . Food insecurity:    Worry: Not on file    Inability: Not on file  . Transportation needs:    Medical: Not on file    Non-medical: Not on file  Tobacco Use  . Smoking status: Never Smoker  . Smokeless tobacco: Never Used  Substance and Sexual Activity  . Alcohol use: Yes    Alcohol/week: 0.6 - 1.2 oz    Types: 1 Glasses of wine per week  . Drug use: No  . Sexual activity: Yes    Partners: Male    Comment: Hyst  Lifestyle  . Physical activity:    Days per week: Not on file    Minutes per session: Not on file  . Stress: Not on file  Relationships  . Social connections:    Talks on phone: Not on file    Gets together: Not on file    Attends religious service: Not on file    Active member of club or organization: Not on file    Attends meetings of clubs or organizations: Not on file    Relationship status: Not on file  . Intimate partner violence:    Fear of current or ex partner: Not on file    Emotionally abused: Not on file    Physically abused: Not on file    Forced sexual activity: Not on file  Other Topics Concern  . 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 [name]. [name]  03' [name] 05'.  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   HENT: Negative.   Eyes: Negative.   Respiratory: Negative.   Cardiovascular: Negative.   Gastrointestinal: Negative.   Endocrine: Negative.   Genitourinary: Positive for frequency.  Musculoskeletal: Negative.   Skin: Negative.   Allergic/Immunologic: Negative.   Neurological: Negative.   Hematological: Negative.   Psychiatric/Behavioral: Negative.     PHYSICAL EXAMINATION:    BP 102/60 (BP Location: Right Arm, Patient Position: Sitting, Cuff Size: Normal)   Pulse 76   Resp 14   Ht 5' 5"  (1.651 m)   Wt 106 lb (48.1 kg)   LMP 08/07/2015   BMI 17.64 kg/m     General appearance: alert, cooperative and appears stated age  Pelvic: External genitalia:  no lesions              Urethra:  normal appearing urethra with no masses, tenderness or lesions              Bartholins and Skenes: normal                 Vagina: normal appearing vagina with normal color and discharge, no lesions              Cervix: no lesions                Bimanual Exam:  Uterus:  normal size, contour, position, consistency, mobility, non-tender              Adnexa: no mass, fullness, tenderness               Chaperone was present for exam.  ASSESSMENT  Hx robotic TLH, bilateral salpingectomy.  Hx HSV 1 or 2.  No visible vulvar lesion today. Urinary frequency.  Normal urine dip.  Crohn's. On Humira.   PLAN  We disucused HSV 1 and 2.  Valtrex 1000 mg po bid x 5 days prn.  #30, RF none.  We will check HSV 1 and 2 IgG.  CBC with diff, CMP, TSH. FU prn.     An After Visit Summary was printed and given to the patient.  __25____ minutes face to face time of which over 50% was spent in counseling.

## 2018-04-16 LAB — COMPREHENSIVE METABOLIC PANEL
ALK PHOS: 73 IU/L (ref 39–117)
ALT: 14 IU/L (ref 0–32)
AST: 14 IU/L (ref 0–40)
Albumin/Globulin Ratio: 1.8 (ref 1.2–2.2)
Albumin: 4.2 g/dL (ref 3.5–5.5)
BILIRUBIN TOTAL: 0.8 mg/dL (ref 0.0–1.2)
BUN/Creatinine Ratio: 17 (ref 9–23)
BUN: 10 mg/dL (ref 6–24)
CHLORIDE: 100 mmol/L (ref 96–106)
CO2: 27 mmol/L (ref 20–29)
Calcium: 9.1 mg/dL (ref 8.7–10.2)
Creatinine, Ser: 0.59 mg/dL (ref 0.57–1.00)
GFR calc Af Amer: 126 mL/min/{1.73_m2} (ref >59)
GFR calc non Af Amer: 109 mL/min/{1.73_m2} (ref >59)
GLUCOSE: 82 mg/dL (ref 65–99)
Globulin, Total: 2.4 g/dL (ref 1.5–4.5)
Potassium: 4.1 mmol/L (ref 3.5–5.2)
Sodium: 138 mmol/L (ref 134–144)
Total Protein: 6.6 g/dL (ref 6.0–8.5)

## 2018-04-16 LAB — CBC WITH DIFFERENTIAL/PLATELET
BASOS ABS: 0 10*3/uL (ref 0.0–0.2)
Basos: 0 %
EOS (ABSOLUTE): 0.1 10*3/uL (ref 0.0–0.4)
Eos: 1 %
HEMOGLOBIN: 13.6 g/dL (ref 11.1–15.9)
Hematocrit: 41.7 % (ref 34.0–46.6)
Immature Grans (Abs): 0 10*3/uL (ref 0.0–0.1)
Immature Granulocytes: 0 %
Lymphocytes Absolute: 2.7 10*3/uL (ref 0.7–3.1)
Lymphs: 37 %
MCH: 30 pg (ref 26.6–33.0)
MCHC: 32.6 g/dL (ref 31.5–35.7)
MCV: 92 fL (ref 79–97)
MONOCYTES: 5 %
MONOS ABS: 0.4 10*3/uL (ref 0.1–0.9)
Neutrophils Absolute: 4.2 10*3/uL (ref 1.4–7.0)
Neutrophils: 57 %
PLATELETS: 233 10*3/uL (ref 150–450)
RBC: 4.54 x10E6/uL (ref 3.77–5.28)
RDW: 14.2 % (ref 12.3–15.4)
WBC: 7.4 10*3/uL (ref 3.4–10.8)

## 2018-04-16 LAB — HSV(HERPES SIMPLEX VRS) I + II AB-IGG
HSV 1 GLYCOPROTEIN G AB, IGG: 2.29 {index} — AB (ref 0.00–0.90)
HSV 2 IGG, TYPE SPEC: 5.45 {index} — AB (ref 0.00–0.90)

## 2018-04-16 LAB — TSH: TSH: 1 u[IU]/mL (ref 0.450–4.500)

## 2018-05-06 ENCOUNTER — Encounter: Payer: Self-pay | Admitting: Medical

## 2018-05-06 ENCOUNTER — Ambulatory Visit: Payer: Federal, State, Local not specified - PPO | Admitting: Medical

## 2018-05-06 VITALS — BP 111/89 | HR 80 | Temp 99.0°F | Resp 16 | Ht 65.0 in | Wt 106.6 lb

## 2018-05-06 DIAGNOSIS — J029 Acute pharyngitis, unspecified: Secondary | ICD-10-CM | POA: Diagnosis not present

## 2018-05-06 LAB — POCT RAPID STREP A (OFFICE): Rapid Strep A Screen: NEGATIVE

## 2018-05-06 MED ORDER — AMOXICILLIN 875 MG PO TABS: 875.0000 mg | ORAL_TABLET | Freq: Two times a day (BID) | ORAL | 0 refills | Status: DC

## 2018-05-06 NOTE — Patient Instructions (Signed)
Your strep test was negative. However, your physical exam and clinical presentation is suspicious for strep and it is important to note that rapid strep test can be falsely negative. So I am going to give you amoxicillin antibiotic today based on your exam and clinical presentation.  Rest hydrate, tylenol for fever, and warm salt water gargles.   Follow up in 7 days or as needed.

## 2018-05-06 NOTE — [place] Notes (Signed)
Subjective:    Patient ID: Janice Zuniga, female    DOB: [birth year], 47 y.o.   MRN: [id number]  HPI  Pt in with moderate to severe throat last couple of days. She actually states hurts moderate to severe even to swallow her own saliva.  Some white spots on tonsils. Red appearance. Son as st as well. He is 7 yr old.   Pt felt mild chill/hot yesterday. But not today. No ha, no neck stiffness, no nausea, no vomiting or ha.     Review of Systems  Constitutional: Positive for chills. Negative for fatigue and fever.  HENT: Positive for sore throat. Negative for congestion, dental problem, trouble swallowing and voice change.   Respiratory: Negative for cough, chest tightness, shortness of breath and wheezing.   Cardiovascular: Negative for chest pain and palpitations.  Gastrointestinal: Negative for abdominal pain.  Musculoskeletal: Negative for back pain and myalgias.  Hematological: Positive for adenopathy.  Psychiatric/Behavioral: Negative for agitation, confusion, decreased concentration and hallucinations. The patient is not nervous/anxious.    Past Medical History:  Diagnosis Date  . Abnormal CT of the abdomen   . Abnormal uterine bleeding   . Anemia    iron def  . Ankylosing spondylitis ([place])   . Chicken pox   . Colitis   . Crohn disease ([place])   . Dysrhythmia    occassional fluttering noted by patient  . Genital warts   . Gluten intolerance   . History of chlamydia   . History of hysterectomy 08/21/15  . HLA B27 (HLA B27 positive)   . HSV-1 (herpes simplex virus 1) infection   . Low calcium levels   . Low iron   . Low serum vitamin D   . Muscle spasms of both lower extremities    Spasms in lower back  . Non-celiac gluten sensitivity   . Reactive airway disease   . Spondyloarthritis    genetic marker  . UTI (lower urinary tract infection)      Social History   Socioeconomic History  . Marital status: Married    Spouse name: Not on file  . Number of  children: Not on file  . Years of education: Not on file  . Highest education level: Not on file  Occupational History  . Not on file  Social Needs  . Financial resource strain: Not on file  . Food insecurity:    Worry: Not on file    Inability: Not on file  . Transportation needs:    Medical: Not on file    Non-medical: Not on file  Tobacco Use  . Smoking status: Never Smoker  . Smokeless tobacco: Never Used  Substance and Sexual Activity  . Alcohol use: Yes    Alcohol/week: 1.0 - 2.0 standard drinks    Types: 1 Glasses of wine per week  . Drug use: No  . Sexual activity: Yes    Partners: Male    Comment: Hyst  Lifestyle  . Physical activity:    Days per week: Not on file    Minutes per session: Not on file  . Stress: Not on file  Relationships  . Social connections:    Talks on phone: Not on file    Gets together: Not on file    Attends religious service: Not on file    Active member of club or organization: Not on file    Attends meetings of clubs or organizations: Not on file    Relationship status: Not on  file  . Intimate partner violence:    Fear of current or ex partner: Not on file    Emotionally abused: Not on file    Physically abused: Not on file    Forced sexual activity: Not on file  Other Topics Concern  . 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 [name]. Janice Zuniga' [name] 05'.    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 ABDOMINAL HYSTERECTOMY  08/21/15  . BILATERAL SALPINGECTOMY Bilateral 08/21/2015   Procedure: BILATERAL SALPINGECTOMY;  Surgeon: Nunzio Cobbs, MD;  Location: [facility];  Service: Gynecology;  Laterality: Bilateral;  . COLONOSCOPY    . CYSTOSCOPY N/A 08/21/2015    Procedure: CYSTOSCOPY;  Surgeon: Nunzio Cobbs, MD;  Location: [facility];  Service: Gynecology;  Laterality: N/A;  . MOUTH SURGERY     cyst in mouth  . ROBOTIC ASSISTED TOTAL HYSTERECTOMY WITH SALPINGECTOMY Bilateral 08/21/2015   Procedure: ROBOTIC ASSISTED TOTAL HYSTERECTOMY ;  Surgeon: Nunzio Cobbs, MD;  Location: [facility];  Service: Gynecology;  Laterality: Bilateral;  . WISDOM TOOTH EXTRACTION      Family History  Problem Relation Age of Onset  . Hypertension Mother   . Diabetes Mother        ? father  . Anxiety disorder Mother   . Cancer Paternal Grandmother        ? type  . Diabetes Father   . Colon polyps Father   . Prostate cancer Father     Allergies  Allergen Reactions  . Gluten Meal Other (See Comments)    Auto immune  . Tramadol     nausea  . Codeine Nausea And Vomiting  . Peanut-Containing Drug Products Other (See Comments)    By allergy test...has eaten peanuts without issues     Current Outpatient Medications on File Prior to Visit  Medication Sig Dispense Refill  . folic acid (FOLVITE) 1 MG tablet Take 1 tablet by mouth daily.    . methotrexate (RHEUMATREX) 2.5 MG tablet Take 8 mg by mouth once a week.     Marland Kitchen PROAIR HFA 108 (90 BASE) MCG/ACT inhaler Inhale 2 puffs into the lungs every 4 (four) hours as needed. Reported on 03/29/2016    . Adalimumab 40 MG/0.8ML PNKT Inject into the skin.     No current facility-administered medications on file prior to visit.     BP 111/89   Pulse 80   Temp 99 F (37.2 C) (Oral)   Resp 16   Ht 5' 5"  (1.651 m)   Wt 106 lb 9.6 oz (48.4 kg)   LMP 08/07/2015   SpO2 100%   BMI 17.74 kg/m       Objective:   Physical Exam  General  Mental Status - Alert. General Appearance - Well groomed. Not in acute distress.  Skin Rashes- No Rashes.  HEENT Head- Normal. Ear Auditory Canal - Left- Normal. Right - Normal.Tympanic Membrane- Left- Normal. Right- Normal. Eye Sclera/Conjunctiva- Left- Normal.  Right- Normal. Nose & Sinuses Nasal Mucosa- Left-  Boggy and Congested. Right-  Boggy and  Congested.Bilateral maxillary and frontal sinus pressure. Mouth & Throat Lips: Upper Lip- Normal:  no dryness, cracking, pallor, cyanosis, or vesicular eruption. Lower Lip-Normal: no dryness, cracking, pallor, cyanosis or vesicular eruption. Buccal Mucosa- Bilateral- No Aphthous ulcers. Oropharynx- No Discharge or Erythema. Tonsils: Characteristics- Bilateral- No Erythema or Congestion. Size/Enlargement- Bilateral- No enlargement. Discharge- bilateral-None.  Neck Neck- Supple. No Masses.   Chest and Lung Exam Auscultation: Breath Sounds:-Clear even and unlabored.  Cardiovascular Auscultation:Rythm- Regular, rate and rhythm. Murmurs & Other Heart Sounds:Ausculatation of the heart reveal- No Murmurs.  Lymphatic Head & Neck General Head & Neck Lymphatics: Bilateral: Description- No Localized lymphadenopathy.       Assessment & Plan:  Your strep test was negative. However, your physical exam and clinical presentation is suspicious for strep and it is important to note that rapid strep test can be falsely negative. So I am going to give you amoxicillin antibiotic today based on your exam and clinical presentation.  Rest hydrate, tylenol for fever, and warm salt water gargles.   Follow up in 7 days or as needed.   Mackie Pai, PA-C

## 2018-05-20 ENCOUNTER — Encounter: Payer: Self-pay | Admitting: Internal Medicine

## 2018-05-20 ENCOUNTER — Ambulatory Visit: Payer: Federal, State, Local not specified - PPO | Admitting: Internal Medicine

## 2018-05-20 ENCOUNTER — Ambulatory Visit (HOSPITAL_BASED_OUTPATIENT_CLINIC_OR_DEPARTMENT_OTHER)
Admission: RE | Admit: 2018-05-20 | Discharge: 2018-05-20 | Disposition: A | Discharge status: Home / Self Care | Payer: Federal, State, Local not specified - PPO | Source: Ambulatory Visit | Attending: Internal Medicine | Admitting: Internal Medicine

## 2018-05-20 VITALS — BP 106/68 | HR 74 | Temp 99.1°F | Resp 16 | Ht 65.0 in | Wt 106.0 lb

## 2018-05-20 DIAGNOSIS — R05 Cough: Secondary | ICD-10-CM | POA: Diagnosis not present

## 2018-05-20 DIAGNOSIS — J029 Acute pharyngitis, unspecified: Secondary | ICD-10-CM

## 2018-05-20 DIAGNOSIS — R059 Cough, unspecified: Secondary | ICD-10-CM

## 2018-05-20 MED ORDER — AZELASTINE HCL 0.1 % NA SOLN: 2.0000 | Freq: Every evening | NASAL | 3 refills | Status: DC | PRN

## 2018-05-20 MED ORDER — BENZONATATE 200 MG PO CAPS: 200.0000 mg | ORAL_CAPSULE | Freq: Three times a day (TID) | ORAL | 0 refills | Status: DC | PRN

## 2018-05-20 MED ORDER — AZITHROMYCIN 250 MG PO TABS: ORAL_TABLET | ORAL | 0 refills | Status: DC

## 2018-05-20 NOTE — Patient Instructions (Signed)
  Please get a chest x-ray at the first floor  Rest, fluids , tylenol  For cough:  Take Mucinex DM twice a day as needed until better You can also take [employer] as needed.  For nasal congestion: Use OTC Nasocort or Flonase : 2 nasal sprays on each side of the nose in the morning until you feel better Use ASTELIN a prescribed spray : 2 nasal sprays on each side of the nose at night until you feel better    Take the antibiotic as prescribed Zithromax, only if not better in the next few days.  Call if not gradually better over the next  10 days  Call anytime if the symptoms are severe, you have high fever, short of breath, chest pain

## 2018-05-20 NOTE — [place] Notes (Signed)
Subjective:    Patient ID: Janice Zuniga, female    DOB: [birth year], 47 y.o.   MRN: [id number]  DOS:  05/20/2018 Type of visit - description : Acute visit  47 year old female, history of ankylosing spondylitis, Crohn's disease, HLA-B27 positive, history of anemia, secondary hemosiderosis, total hysterectomy,  here for evaluation  Seen 05/06/2018 at this office, sore throat for 2 days, rapid strep test negative, was prescribed amoxicillin on clinical grounds. Pt skipped  methotrexate x 1   Review of Systems She is here because after 6 or 7 days of penicillin she is not better. Continue with sore throat on and off. She feels worse at night with persistent cough that make her sleep difficult. She finds a lot of mucus pooled in the throat at least twice every night. No fever chills No GERD symptoms, no myalgias. No major problems with sinus pain, pressure, postnasal dripping or wheezing. She has an inhaler but has not needed it in years.  Past Medical History:  Diagnosis Date  . Abnormal CT of the abdomen   . Abnormal uterine bleeding   . Anemia    iron def  . Ankylosing spondylitis ([place])   . Chicken pox   . Colitis   . Crohn disease ([place])   . Dysrhythmia    occassional fluttering noted by patient  . Genital warts   . Gluten intolerance   . History of chlamydia   . History of hysterectomy 08/21/15  . HLA B27 (HLA B27 positive)   . HSV-1 (herpes simplex virus 1) infection   . Low calcium levels   . Low iron   . Low serum vitamin D   . Muscle spasms of both lower extremities    Spasms in lower back  . Non-celiac gluten sensitivity   . Reactive airway disease   . Spondyloarthritis    genetic marker  . UTI (lower urinary tract infection)     Past Surgical History:  Procedure Laterality Date  . ABDOMINAL HYSTERECTOMY  08/21/15  . BILATERAL SALPINGECTOMY Bilateral 08/21/2015   Procedure: BILATERAL SALPINGECTOMY;  Surgeon: Nunzio Cobbs, MD;  Location: [facility];  Service: Gynecology;  Laterality: Bilateral;  . COLONOSCOPY    . CYSTOSCOPY N/A 08/21/2015   Procedure: CYSTOSCOPY;  Surgeon: Nunzio Cobbs, MD;  Location: [facility];  Service: Gynecology;  Laterality: N/A;  . MOUTH SURGERY     cyst in mouth  . ROBOTIC ASSISTED TOTAL HYSTERECTOMY WITH SALPINGECTOMY Bilateral 08/21/2015   Procedure: ROBOTIC ASSISTED TOTAL HYSTERECTOMY ;  Surgeon: Nunzio Cobbs, MD;  Location: [facility];  Service: Gynecology;  Laterality: Bilateral;  . WISDOM TOOTH EXTRACTION      Social History   Socioeconomic History  . Marital status: Married    Spouse name: Not on file  . Number of children: Not on file  . Years of education: Not on file  . Highest education level: Not on file  Occupational History  . Not on file  Social Needs  . Financial resource strain: Not on file  . Food insecurity:    Worry: Not on file    Inability: Not on file  . Transportation needs:    Medical: Not on file    Non-medical: Not on file  Tobacco Use  . Smoking status: Never Smoker  . Smokeless tobacco: Never Used  Substance and Sexual Activity  . Alcohol use: Yes    Alcohol/week: 1.0 - 2.0 standard drinks    Types:  1 Glasses of wine per week  . Drug use: No  . Sexual activity: Yes    Partners: Male    Comment: Hyst  Lifestyle  . Physical activity:    Days per week: Not on file    Minutes per session: Not on file  . Stress: Not on file  Relationships  . Social connections:    Talks on phone: Not on file    Gets together: Not on file    Attends religious service: Not on file    Active member of club or organization: Not on file    Attends meetings of clubs or organizations: Not on file    Relationship status: Not on file  . Intimate partner violence:    Fear of current or ex partner: Not on file    Emotionally abused: Not on file    Physically abused: Not on file    Forced sexual activity: Not on file  Other Topics Concern  . 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 [name]. [name].    Got B[employer]S. 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 Always use your most recent med list.          Adalimumab 40 MG/0.8ML Pnkt Inject into the skin.   azelastine 0.1 % nasal spray Commonly known as:  ASTELIN Place 2 sprays into both nostrils at bedtime as needed for rhinitis. Use in each nostril as directed   azithromycin 250 MG tablet Commonly known as:  ZITHROMAX 2 tabs a day the first day, then 1 tab a day x 4 days   benzonatate 200 MG capsule Commonly known as:  TESSALON Take 1 capsule (200 mg total) by mouth 3 (three) times daily as needed for cough.   folic acid 1 MG tablet Commonly known as:  FOLVITE Take 1 tablet by mouth daily.   methotrexate 2.5 MG tablet Commonly known as:  RHEUMATREX Take 8 mg by mouth once a week.   PROAIR HFA 108 (90 Base) MCG/ACT inhaler Generic drug:  albuterol Inhale 2 puffs into the lungs every 4 (four) hours as needed. Reported on 03/29/2016          Objective:   Physical Exam BP 106/68 (BP Location: Left Arm, Patient Position: Sitting, Cuff Size: Small)   Pulse 74   Temp 99.1 F (37.3 C) (Oral)   Resp 16   Ht 5' 5" (1.651 m)   Wt 106 lb (48.1 kg)   LMP 08/07/2015   SpO2 98%   BMI 17.64 kg/m  General:   Well developed, NAD, see BMI.  HEENT:  Normocephalic . Face symmetric, atraumatic. Nose is slightly congested, TMs normal, throat symmetric, no  redness. Lungs:  CTA B Normal respiratory effort, no intercostal retractions, no accessory muscle use. Heart: RRR,  no  murmur.  No pretibial edema bilaterally  Skin: Not pale. Not jaundice Neurologic:  alert & oriented X3.  Speech normal, gait appropriate for age and unassisted Psych--  Cognition and judgment appear intact.  Cooperative with normal attention span and concentration.  Behavior appropriate. No anxious or depressed appearing.      Assessment & Plan:   Cough: Cough for approximately 3 weeks in the context of a URI type of picture. She is immunosuppressed so to be sure I will get throat culture and a chest x-ray. rec supportive treatment with fluids, Tylenol, Mucinex, Tessalon, Astelin, Flonase. Unable to prescribe codeine for cough control due to intolerances. If not better in the next few days to start a Z-Pak (atypical infection?). Return to the office anytime if severe symptoms.

## 2018-05-21 LAB — CULTURE, GROUP A STREP
MICRO NUMBER:: 91029501
SPECIMEN QUALITY:: ADEQUATE

## 2018-06-15 DIAGNOSIS — K080 Exfoliation of teeth due to systemic causes: Secondary | ICD-10-CM | POA: Diagnosis not present

## 2018-07-01 ENCOUNTER — Ambulatory Visit: Payer: Federal, State, Local not specified - PPO | Admitting: Internal Medicine

## 2018-07-01 ENCOUNTER — Encounter: Payer: Self-pay | Admitting: Internal Medicine

## 2018-07-01 VITALS — BP 118/64 | HR 68 | Temp 98.3°F | Resp 16 | Ht 65.0 in | Wt 107.0 lb

## 2018-07-01 DIAGNOSIS — R21 Rash and other nonspecific skin eruption: Secondary | ICD-10-CM

## 2018-07-01 MED ORDER — BETAMETHASONE DIPROPIONATE AUG 0.05 % EX CREA: TOPICAL_CREAM | Freq: Two times a day (BID) | CUTANEOUS | 0 refills | Status: DC

## 2018-07-01 NOTE — Patient Instructions (Signed)
Apply the ointment twice a day x 1 week  Benadryl if needed  Call if no better

## 2018-07-01 NOTE — Progress Notes (Signed)
Subjective:    Patient ID: Janice Zuniga, female    DOB: May 07, 1971, 47 y.o.   MRN: [id number]  DOS:  07/01/2018 Type of visit - description : acute Interval history: 5 days ago, she was sitting at her porch at night, felt that something bite her on the shoulders. Then she developed a itchy rash.   Review of Systems No fever chills No other rashes No blisters, initial lesion looked like a whelp. Lesions are not painful.  Past Medical History:  Diagnosis Date  . Abnormal CT of the abdomen   . Abnormal uterine bleeding   . Anemia    iron def  . Ankylosing spondylitis ([place])   . Chicken pox   . Colitis   . Crohn disease ([place])   . Dysrhythmia    occassional fluttering noted by patient  . Genital warts   . Gluten intolerance   . History of chlamydia   . History of hysterectomy 08/21/15  . HLA B27 (HLA B27 positive)   . HSV-1 (herpes simplex virus 1) infection   . Low calcium levels   . Low iron   . Low serum vitamin D   . Muscle spasms of both lower extremities    Spasms in lower back  . Non-celiac gluten sensitivity   . Reactive airway disease   . Spondyloarthritis    genetic marker  . UTI (lower urinary tract infection)     Past Surgical History:  Procedure Laterality Date  . ABDOMINAL HYSTERECTOMY  08/21/15  . BILATERAL SALPINGECTOMY Bilateral 08/21/2015   Procedure: BILATERAL SALPINGECTOMY;  Surgeon: Nunzio Cobbs, MD;  Location: [facility];  Service: Gynecology;  Laterality: Bilateral;  . COLONOSCOPY    . CYSTOSCOPY N/A 08/21/2015   Procedure: CYSTOSCOPY;  Surgeon: Nunzio Cobbs, MD;  Location: [facility];  Service: Gynecology;  Laterality: N/A;  . MOUTH SURGERY     cyst in mouth  . ROBOTIC ASSISTED TOTAL HYSTERECTOMY WITH SALPINGECTOMY Bilateral 08/21/2015   Procedure: ROBOTIC ASSISTED TOTAL HYSTERECTOMY ;  Surgeon: Nunzio Cobbs, MD;  Location: [facility];  Service: Gynecology;  Laterality: Bilateral;  . WISDOM TOOTH EXTRACTION       Social History   Socioeconomic History  . Marital status: Married    Spouse name: Not on file  . Number of children: Not on file  . Years of education: Not on file  . Highest education level: Not on file  Occupational History  . Not on file  Social Needs  . Financial resource strain: Not on file  . Food insecurity:    Worry: Not on file    Inability: Not on file  . Transportation needs:    Medical: Not on file    Non-medical: Not on file  Tobacco Use  . Smoking status: Never Smoker  . Smokeless tobacco: Never Used  Substance and Sexual Activity  . Alcohol use: Yes    Alcohol/week: 1.0 - 2.0 standard drinks    Types: 1 Glasses of wine per week  . Drug use: No  . Sexual activity: Yes    Partners: Male    Comment: Hyst  Lifestyle  . Physical activity:    Days per week: Not on file    Minutes per session: Not on file  . Stress: Not on file  Relationships  . Social connections:    Talks on phone: Not on file    Gets together: Not on file    Attends religious service:  Not on file    Active member of club or organization: Not on file    Attends meetings of clubs or organizations: Not on file    Relationship status: Not on file  . Intimate partner violence:    Fear of current or ex partner: Not on file    Emotionally abused: Not on file    Physically abused: Not on file    Forced sexual activity: Not on file  Other Topics Concern  . 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 [name]. [name].    Got B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 Always use your most recent med list.          Adalimumab 40 MG/0.8ML Pnkt Inject into the skin.   augmented betamethasone dipropionate 0.05 % cream Commonly known as:  DIPROLENE-AF Apply topically 2 (two) times daily.   folic acid 1 MG tablet Commonly known as:  FOLVITE Take 1 tablet by mouth daily.   methotrexate 2.5 MG tablet Commonly known as:  RHEUMATREX Take 8 mg by mouth once a week.   PROAIR HFA 108 (90 Base) MCG/ACT inhaler Generic drug:  albuterol Inhale 2 puffs into the lungs every 4 (four) hours as needed. Reported on 03/29/2016          Objective:   Physical Exam BP 118/64 (BP Location: Left Arm, Patient Position: Sitting, Cuff Size: Small)   Pulse 68   Temp 98.3 F (36.8 C) (Oral)   Resp 16   Ht _0  (1.651 m)   Wt 107 lb (48.5 kg)   LMP 08/07/2015   SpO2 98%   BMI 17.81 kg/m  General:   Well developed, NAD, see BMI.  HEENT:  Normocephalic . Face symmetric, atraumatic Skin: Had 4 skin lesions on the right shoulder and 3 on the left.  See picture. Neurologic:  alert & oriented X3.  Speech normal, gait appropriate for age and unassisted Psych--  Cognition and judgment appear intact.  Cooperative with normal attention span and concentration.  Behavior appropriate. No anxious or depressed appearing.         Assessment & Plan:   47 year old female with history of ankylosis spondylitis, Crohn's disease, taking Adalimumab, MTX presents w/:  Rash: Started 5 days ago, no systemic symptoms, probably is indeed insect bite.  Taking OTC topical steroids without much help.  Recommend a higher potency  steroid topically for few days and  OTC po Benadryl, call if not better, call if systemic symptoms or further rash

## 2018-07-01 NOTE — Progress Notes (Signed)
Pre visit review using our clinic review tool, if applicable. No additional management support is needed unless otherwise documented below in the visit note. 

## 2018-07-06 DIAGNOSIS — K50819 Crohn's disease of both small and large intestine with unspecified complications: Secondary | ICD-10-CM | POA: Diagnosis not present

## 2018-07-06 DIAGNOSIS — Z79899 Other long term (current) drug therapy: Secondary | ICD-10-CM | POA: Diagnosis not present

## 2018-07-06 DIAGNOSIS — M450 Ankylosing spondylitis of multiple sites in spine: Secondary | ICD-10-CM | POA: Diagnosis not present

## 2018-07-06 DIAGNOSIS — Z1589 Genetic susceptibility to other disease: Secondary | ICD-10-CM | POA: Diagnosis not present

## 2018-07-23 ENCOUNTER — Ambulatory Visit: Payer: Federal, State, Local not specified - PPO | Admitting: Psychology

## 2018-07-23 DIAGNOSIS — F4323 Adjustment disorder with mixed anxiety and depressed mood: Secondary | ICD-10-CM

## 2018-08-12 ENCOUNTER — Ambulatory Visit: Payer: Federal, State, Local not specified - PPO | Admitting: Psychology

## 2018-08-12 DIAGNOSIS — F4323 Adjustment disorder with mixed anxiety and depressed mood: Secondary | ICD-10-CM | POA: Diagnosis not present

## 2018-08-24 ENCOUNTER — Ambulatory Visit (INDEPENDENT_AMBULATORY_CARE_PROVIDER_SITE_OTHER): Payer: Federal, State, Local not specified - PPO

## 2018-08-24 DIAGNOSIS — Z23 Encounter for immunization: Secondary | ICD-10-CM

## 2018-10-05 ENCOUNTER — Encounter: Payer: Self-pay | Admitting: Family

## 2018-10-05 ENCOUNTER — Ambulatory Visit: Payer: Federal, State, Local not specified - PPO | Admitting: Family

## 2018-10-05 ENCOUNTER — Ambulatory Visit: Payer: Self-pay | Admitting: Psychology

## 2018-10-05 VITALS — BP 113/72 | HR 68 | Temp 98.9°F | Resp 16 | Ht 68.0 in | Wt 114.0 lb

## 2018-10-05 DIAGNOSIS — J40 Bronchitis, not specified as acute or chronic: Secondary | ICD-10-CM

## 2018-10-05 MED ORDER — ALBUTEROL SULFATE HFA 108 (90 BASE) MCG/ACT IN AERS: 2.0000 | INHALATION_SPRAY | Freq: Four times a day (QID) | RESPIRATORY_TRACT | 0 refills | Status: DC | PRN

## 2018-10-05 MED ORDER — BENZONATATE 100 MG PO CAPS: 100.0000 mg | ORAL_CAPSULE | Freq: Three times a day (TID) | ORAL | 0 refills | Status: DC | PRN

## 2018-10-05 MED ORDER — AZITHROMYCIN 250 MG PO TABS: ORAL_TABLET | ORAL | 0 refills | Status: DC

## 2018-10-05 NOTE — Patient Instructions (Signed)
Please begin zpak for bronchitis. Albuterol as needed for wheezing.   Tessalon as needed for coughing. Call if new/worsening symptoms or if symptoms are not improved in 3-4 days.

## 2018-10-05 NOTE — Progress Notes (Signed)
Subjective:    Patient ID: Janice Zuniga, female    DOB: 08-26-1971, 49 y.o.   MRN: [id number]  HPI  Patient is a 48 yr old female who presents today with chief complaint of cough. Cough is described as productive. Reports that about 10 days ago she developed some URI symptoms.  Reports tickle in her throat. In the evening she starts to feel "really crappy." Feels "like my body is going downhill," then the coughing starts. Reports that cough is productive of [place]/yellow sputum. Not sleeping well due to cough.   Reports that she picked up nyquil severe which helped only briefly. This AM she started blowing out dark rust nasal drainage.    Review of Systems See HPI  Past Medical History:  Diagnosis Date  . Abnormal CT of the abdomen   . Abnormal uterine bleeding   . Anemia    iron def  . Ankylosing spondylitis ([place])   . Chicken pox   . Colitis   . Crohn disease ([place])   . Dysrhythmia    occassional fluttering noted by patient  . Genital warts   . Gluten intolerance   . History of chlamydia   . History of hysterectomy 08/21/15  . HLA B27 (HLA B27 positive)   . HSV-1 (herpes simplex virus 1) infection   . Low calcium levels   . Low iron   . Low serum vitamin D   . Muscle spasms of both lower extremities    Spasms in lower back  . Non-celiac gluten sensitivity   . Reactive airway disease   . Spondyloarthritis    genetic marker  . UTI (lower urinary tract infection)      Social History   Socioeconomic History  . Marital status: Married    Spouse name: Not on file  . Number of children: Not on file  . Years of education: Not on file  . Highest education level: Not on file  Occupational History  . Not on file  Social Needs  . Financial resource strain: Not on file  . Food insecurity:    Worry: Not on file    Inability: Not on file  . Transportation needs:    Medical: Not on file    Non-medical: Not on file  Tobacco Use  . Smoking status: Never Smoker  .  Smokeless tobacco: Never Used  Substance and Sexual Activity  . Alcohol use: Yes    Alcohol/week: 1.0 - 2.0 standard drinks    Types: 1 Glasses of wine per week  . Drug use: No  . Sexual activity: Yes    Partners: Male    Comment: Hyst  Lifestyle  . Physical activity:    Days per week: Not on file    Minutes per session: Not on file  . Stress: Not on file  Relationships  . Social connections:    Talks on phone: Not on file    Gets together: Not on file    Attends religious service: Not on file    Active member of club or organization: Not on file    Attends meetings of clubs or organizations: Not on file    Relationship status: Not on file  . Intimate partner violence:    Fear of current or ex partner: Not on file    Emotionally abused: Not on file    Physically abused: Not on file    Forced sexual activity: Not on file  Other Topics Concern  . 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 [name]. [name].    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 ABDOMINAL HYSTERECTOMY  08/21/15  . BILATERAL SALPINGECTOMY Bilateral 08/21/2015   Procedure: BILATERAL SALPINGECTOMY;  Surgeon: Nunzio Cobbs, MD;  Location: [facility];  Service: Gynecology;  Laterality: Bilateral;  . COLONOSCOPY    . CYSTOSCOPY N/A 08/21/2015   Procedure: CYSTOSCOPY;  Surgeon: Nunzio Cobbs, MD;  Location: [facility];  Service: Gynecology;  Laterality: N/A;  . MOUTH SURGERY     cyst in mouth  . ROBOTIC ASSISTED TOTAL HYSTERECTOMY WITH SALPINGECTOMY Bilateral 08/21/2015   Procedure: ROBOTIC ASSISTED TOTAL HYSTERECTOMY ;  Surgeon: Nunzio Cobbs, MD;  Location: [facility];  Service: Gynecology;  Laterality: Bilateral;  .  WISDOM TOOTH EXTRACTION      Family History  Problem Relation Age of Onset  . Hypertension Mother   . Diabetes Mother        ? father  . Anxiety disorder Mother   . Cancer Paternal Grandmother        ? type  . Diabetes Father   . Colon polyps Father   . Prostate cancer Father     Allergies  Allergen Reactions  . Gluten Meal Other (See Comments)    Auto immune  . Tramadol     nausea  . Codeine Nausea And Vomiting  . Peanut-Containing Drug Products Other (See Comments)    By allergy test...has eaten peanuts without issues     Current Outpatient Medications on File Prior to Visit  Medication Sig Dispense Refill  . folic acid (FOLVITE) 1 MG tablet Take 1 tablet by mouth daily.    . methotrexate (RHEUMATREX) 2.5 MG tablet Take 8 mg by mouth once a week.     Marland Kitchen PROAIR HFA 108 (90 BASE) MCG/ACT inhaler Inhale 2 puffs into the lungs every 4 (four) hours as needed. Reported on 03/29/2016    . Adalimumab 40 MG/0.8ML PNKT Inject into the skin.     No current facility-administered medications on file prior to visit.     BP 113/72 (BP Location: Right Arm, Patient Position: Sitting, Cuff Size: Small)   Pulse 68   Temp 98.9 F (37.2 C) (Oral)   Resp 16   Ht 5' 8" (1.727 m)   Wt 114 lb (51.7 kg)   LMP 08/07/2015   SpO2 100%   BMI 17.33 kg/m       Objective:   Physical Exam Constitutional:      Appearance: She is well-developed.  HENT:     Head: Normocephalic and atraumatic.     Right Ear: Tympanic membrane and ear canal normal.     Left Ear: Tympanic membrane and ear canal normal.     Mouth/Throat:     Mouth: Mucous membranes are moist.     Pharynx: No oropharyngeal exudate or posterior oropharyngeal erythema.  Neck:     Musculoskeletal: Neck supple.     Thyroid: No thyromegaly.  Cardiovascular:     Rate and Rhythm: Normal rate and regular rhythm.     Heart sounds: Normal heart sounds. No murmur.  Pulmonary:     Effort: No  respiratory distress.     Breath sounds:  Wheezing (faint left sided expiratory wheeze) present.  Skin:    General: Skin is warm and dry.  Neurological:     Mental Status: She is alert and oriented to person, place, and time.  Psychiatric:        Behavior: Behavior normal.        Thought Content: Thought content normal.        Judgment: Judgment normal.           Assessment & Plan:  Bronchitis- New.  advised pt as follows:    Please begin zpak for bronchitis. Albuterol as needed for wheezing.   Tessalon as needed for coughing. I also advised her to contact her rheumatologist to see if they want her to hold her humira injection this week.  Call if new/worsening symptoms or if symptoms are not improved in 3-4 days.

## 2018-10-19 DIAGNOSIS — K50819 Crohn's disease of both small and large intestine with unspecified complications: Secondary | ICD-10-CM | POA: Diagnosis not present

## 2018-10-19 DIAGNOSIS — M450 Ankylosing spondylitis of multiple sites in spine: Secondary | ICD-10-CM | POA: Diagnosis not present

## 2018-10-19 DIAGNOSIS — Z1589 Genetic susceptibility to other disease: Secondary | ICD-10-CM | POA: Diagnosis not present

## 2018-10-19 DIAGNOSIS — Z79899 Other long term (current) drug therapy: Secondary | ICD-10-CM | POA: Diagnosis not present

## 2018-10-23 DIAGNOSIS — F4323 Adjustment disorder with mixed anxiety and depressed mood: Secondary | ICD-10-CM | POA: Diagnosis not present

## 2018-11-16 ENCOUNTER — Ambulatory Visit: Payer: Federal, State, Local not specified - PPO | Admitting: Family Medicine

## 2018-11-16 ENCOUNTER — Encounter: Payer: Self-pay | Admitting: Family Medicine

## 2018-11-16 ENCOUNTER — Ambulatory Visit: Payer: Self-pay | Admitting: Medical

## 2018-11-16 VITALS — BP 102/62 | HR 78 | Temp 100.0°F | Ht 63.0 in | Wt 115.5 lb

## 2018-11-16 DIAGNOSIS — R6889 Other general symptoms and signs: Secondary | ICD-10-CM

## 2018-11-16 MED ORDER — OSELTAMIVIR PHOSPHATE 75 MG PO CAPS: 75.0000 mg | ORAL_CAPSULE | Freq: Two times a day (BID) | ORAL | 0 refills | Status: DC

## 2018-11-16 NOTE — Progress Notes (Signed)
Chief Complaint  Patient presents with  . Cough  . Fever    Charlies Silvers here for URI complaints. Here w husband.  Duration: 1 day  Associated symptoms: Fever (101 F max), sinus headache, sinus congestion, rhinorrhea and cough Denies: sinus pain, itchy watery eyes, ear pain, ear drainage, sore throat, wheezing, shortness of breath and myalgia Treatment to date: Nyquil Sick contacts: Yes son w similar s/s's  ROS:  Const: +fevers HEENT: As noted in HPI Lungs: No SOB  Past Medical History:  Diagnosis Date  . Abnormal CT of the abdomen   . Abnormal uterine bleeding   . Anemia    iron def  . Ankylosing spondylitis ([place])   . Chicken pox   . Colitis   . Crohn disease ([place])   . Dysrhythmia    occassional fluttering noted by patient  . Genital warts   . Gluten intolerance   . History of chlamydia   . History of hysterectomy 08/21/15  . HLA B27 (HLA B27 positive)   . HSV-1 (herpes simplex virus 1) infection   . Low calcium levels   . Low iron   . Low serum vitamin D   . Muscle spasms of both lower extremities    Spasms in lower back  . Non-celiac gluten sensitivity   . Reactive airway disease   . Spondyloarthritis    genetic marker  . UTI (lower urinary tract infection)     BP 102/62 (BP Location: Left Arm, Patient Position: Sitting, Cuff Size: Normal)   Pulse 78   Temp 100 F (37.8 C) (Oral)   Ht 5' 3"  (1.6 m)   Wt 115 lb 8 oz (52.4 kg)   LMP 08/07/2015   SpO2 97%   BMI 20.46 kg/m  General: Awake, alert, appears stated age 1: AT, [place], ears patent b/l and TM's neg, nares patent w clear R sided discharge, pharynx pink and without exudates, MMM Neck: No masses or asymmetry Heart: RRR Lungs: CTAB, no accessory muscle use Psych: Age appropriate judgment and insight, normal mood and affect  Flu-like symptoms - Plan: oseltamivir (TAMIFLU) 75 MG capsule  Orders as above. Given s/s's, will tx.  Continue to push fluids, practice good hand hygiene, cover mouth  when coughing. F/u prn. If starting to experience increasing fevers, shaking, or shortness of breath, seek immediate care. Pt voiced understanding and agreement to the plan.  [place], DO 11/16/18 2:44 PM

## 2018-11-16 NOTE — Patient Instructions (Addendum)
Continue to push fluids, practice good hand hygiene, and cover your mouth if you cough.  If you start having increasing fevers, shaking or shortness of breath, seek immediate care.  For symptoms, consider using Vick's VapoRub on chest or under nose, air humidifier, Benadryl at night, and elevating the head of the bed. Tylenol and ibuprofen for aches and pains you may be experiencing.   Let us know if you need anything.

## 2018-11-20 ENCOUNTER — Encounter: Payer: Self-pay | Admitting: Family

## 2018-11-20 ENCOUNTER — Ambulatory Visit: Payer: Federal, State, Local not specified - PPO | Admitting: Family

## 2018-11-20 VITALS — BP 115/75 | HR 80 | Temp 98.1°F | Resp 16 | Wt 114.6 lb

## 2018-11-20 DIAGNOSIS — J209 Acute bronchitis, unspecified: Secondary | ICD-10-CM

## 2018-11-20 MED ORDER — AZITHROMYCIN 250 MG PO TABS: ORAL_TABLET | ORAL | 0 refills | Status: DC

## 2018-11-20 MED ORDER — PREDNISONE 10 MG PO TABS: ORAL_TABLET | ORAL | 0 refills | Status: DC

## 2018-11-20 MED ORDER — ALBUTEROL SULFATE HFA 108 (90 BASE) MCG/ACT IN AERS: 2.0000 | INHALATION_SPRAY | Freq: Four times a day (QID) | RESPIRATORY_TRACT | 0 refills | Status: DC | PRN

## 2018-11-20 NOTE — Progress Notes (Signed)
Subjective:    Patient ID: Janice Zuniga, female    DOB: 12-09-1970, 48 y.o.   MRN: [id number]  HPI  Patient is a 47 yr old female who presents today with chief complaint of cough. Patient saw Dr. Nani Ravens on 11/16/18  With flu like symptoms. Was treated empirically with tamiflu.  She reports that cough and chest congestion have worsened since that time.  She reports that she opted not to start tamiflu.  Reports that 2 days ago her fever came down. She is drinking a lot of liquids.  Reports that her chest congestion is "thicker."  Felt like she could not get the phlegm out.     Review of Systems See HPI  Past Medical History:  Diagnosis Date  . Abnormal CT of the abdomen   . Abnormal uterine bleeding   . Anemia    iron def  . Ankylosing spondylitis ([place])   . Chicken pox   . Colitis   . Crohn disease ([place])   . Dysrhythmia    occassional fluttering noted by patient  . Genital warts   . Gluten intolerance   . History of chlamydia   . History of hysterectomy 08/21/15  . HLA B27 (HLA B27 positive)   . HSV-1 (herpes simplex virus 1) infection   . Low calcium levels   . Low iron   . Low serum vitamin D   . Muscle spasms of both lower extremities    Spasms in lower back  . Non-celiac gluten sensitivity   . Reactive airway disease   . Spondyloarthritis    genetic marker  . UTI (lower urinary tract infection)      Social History   Socioeconomic History  . Marital status: Married    Spouse name: Not on file  . Number of children: Not on file  . Years of education: Not on file  . Highest education level: Not on file  Occupational History  . Not on file  Social Needs  . Financial resource strain: Not on file  . Food insecurity:    Worry: Not on file    Inability: Not on file  . Transportation needs:    Medical: Not on file    Non-medical: Not on file  Tobacco Use  . Smoking status: Never Smoker  . Smokeless tobacco: Never Used  Substance and Sexual Activity  .  Alcohol use: Yes    Alcohol/week: 1.0 - 2.0 standard drinks    Types: 1 Glasses of wine per week  . Drug use: No  . Sexual activity: Yes    Partners: Male    Comment: Hyst  Lifestyle  . Physical activity:    Days per week: Not on file    Minutes per session: Not on file  . Stress: Not on file  Relationships  . Social connections:    Talks on phone: Not on file    Gets together: Not on file    Attends religious service: Not on file    Active member of club or organization: Not on file    Attends meetings of clubs or organizations: Not on file    Relationship status: Not on file  . Intimate partner violence:    Fear of current or ex partner: Not on file    Emotionally abused: Not on file    Physically abused: Not on file    Forced sexual activity: Not on file  Other Topics Concern  . Not on file  Social History Narrative  Married 1999 [place]). 3 kids. [name]. [name].    Got B[employer]S. 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 ABDOMINAL HYSTERECTOMY  08/21/15  . BILATERAL SALPINGECTOMY Bilateral 08/21/2015   Procedure: BILATERAL SALPINGECTOMY;  Surgeon: Nunzio Cobbs, MD;  Location: [facility];  Service: Gynecology;  Laterality: Bilateral;  . COLONOSCOPY    . CYSTOSCOPY N/A 08/21/2015   Procedure: CYSTOSCOPY;  Surgeon: Nunzio Cobbs, MD;  Location: [facility];  Service: Gynecology;  Laterality: N/A;  . MOUTH SURGERY     cyst in mouth  . ROBOTIC ASSISTED TOTAL HYSTERECTOMY WITH SALPINGECTOMY Bilateral 08/21/2015   Procedure: ROBOTIC ASSISTED TOTAL HYSTERECTOMY ;  Surgeon: Nunzio Cobbs, MD;  Location: [facility];  Service: Gynecology;  Laterality: Bilateral;  . WISDOM TOOTH EXTRACTION      Family History  Problem Relation  Age of Onset  . Hypertension Mother   . Diabetes Mother        ? father  . Anxiety disorder Mother   . Cancer Paternal Grandmother        ? type  . Diabetes Father   . Colon polyps Father   . Prostate cancer Father     Allergies  Allergen Reactions  . Gluten Meal Other (See Comments)    Auto immune  . Tramadol     nausea  . Codeine Nausea And Vomiting  . Peanut-Containing Drug Products Other (See Comments)    By allergy test...has eaten peanuts without issues     Current Outpatient Medications on File Prior to Visit  Medication Sig Dispense Refill  . albuterol (PROVENTIL HFA;VENTOLIN HFA) 108 (90 Base) MCG/ACT inhaler Inhale 2 puffs into the lungs every 6 (six) hours as needed for wheezing or shortness of breath. 1 Inhaler 0  . folic acid (FOLVITE) 1 MG tablet Take 1 tablet by mouth daily.    . methotrexate (RHEUMATREX) 2.5 MG tablet Take 8 mg by mouth once a week.     . Adalimumab 40 MG/0.8ML PNKT Inject into the skin.     No current facility-administered medications on file prior to visit.     BP 115/75 (BP Location: Right Arm, Patient Position: Sitting, Cuff Size: Small)   Pulse 80   Temp 98.1 F (36.7 C) (Oral)   Resp 16   Wt 114 lb 9.6 oz (52 kg)   LMP 08/07/2015   SpO2 98%   BMI 20.30 kg/m       Objective:   Physical Exam Constitutional:      Appearance: She is well-developed.  HENT:     Right Ear: Tympanic membrane and ear canal normal.     Left Ear: Tympanic membrane and ear canal normal.  Neck:     Musculoskeletal: Neck supple.     Thyroid: No thyromegaly.  Cardiovascular:     Rate and Rhythm: Normal rate and regular rhythm.     Heart sounds: Normal heart sounds. No murmur.  Pulmonary:     Effort: Pulmonary effort is normal. No respiratory distress.     Breath sounds: Examination of the right-middle field reveals wheezing. Wheezing and rhonchi present.  Skin:    General: Skin is warm and dry.  Neurological:  Mental Status: She is alert and  oriented to person, place, and time.  Psychiatric:        Behavior: Behavior normal.        Thought Content: Thought content normal.        Judgment: Judgment normal.           Assessment & Plan:  Bronchitis with bronchospasm- She plans to hold her humira this week which I agree with. Pt is advised pt as follows:  Begin Azithromycin and prednisone taper. You may use albuterol 2 puffs every 6 hours as needed for cough/wheezing. You mucinex twice daily as needed for chest congestion. Call if symptoms worsen, if fever >101 or if not improved in 2-3 days.

## 2018-11-20 NOTE — Patient Instructions (Signed)
Begin Azithromycin and prednisone taper. You may use albuterol 2 puffs every 6 hours as needed for cough/wheezing. You mucinex twice daily as needed for chest congestion. Call if symptoms worsen, if fever >101 or if not improved in 2-3 days.

## 2018-11-30 ENCOUNTER — Encounter (HOSPITAL_BASED_OUTPATIENT_CLINIC_OR_DEPARTMENT_OTHER): Payer: Self-pay

## 2018-12-17 ENCOUNTER — Telehealth: Payer: Self-pay | Admitting: Obstetrics and Gynecology

## 2018-12-17 NOTE — Telephone Encounter (Signed)
Spoke with patient. Patient states that she has vaginal irritation and increased thick white discharge. Feel she has a yeast infection. Advised may use OTC Monistat 3 or 7 and hydrocortisone ointment externally for relief. If symptoms persist or worsen with treatment will need and OV. Patient denies any risk for STDs.   Routing to provider and will close encounter.

## 2018-12-17 NOTE — Telephone Encounter (Signed)
Patient called and said she thinks she may have a yeast infection. She is open to coming in for a visit or a virtual visit, if appropriate.

## 2018-12-18 ENCOUNTER — Encounter: Payer: Self-pay | Admitting: Family

## 2018-12-18 ENCOUNTER — Telehealth: Payer: Self-pay | Admitting: Obstetrics and Gynecology

## 2018-12-18 ENCOUNTER — Encounter (HOSPITAL_BASED_OUTPATIENT_CLINIC_OR_DEPARTMENT_OTHER): Payer: Federal, State, Local not specified - PPO

## 2018-12-18 NOTE — Telephone Encounter (Signed)
Spoke with patient. Patient f/u with telephone call on 3/26. Started OTC monistat for yeast. Has a small "bump or lesion" on labia that is recurrent in same spot, hx of HSV. Patient asking if she can take valtrex while taking monistat? Advised ok to take valtrex as prescribed with monistat, iof symptoms do not resolve, return call to office for OV. Advised Dr. Quincy Simmonds will review, our office will return call if any additional recommendations.   Routing to provider for final review. Patient is agreeable to disposition. Will close encounter.

## 2018-12-18 NOTE — Telephone Encounter (Signed)
Patient would like to discuss her symptoms with a nurse. She said she talked with [facility] yesterday and wants to follow up.

## 2018-12-20 ENCOUNTER — Encounter: Payer: Self-pay | Admitting: Obstetrics and Gynecology

## 2019-01-18 DIAGNOSIS — M450 Ankylosing spondylitis of multiple sites in spine: Secondary | ICD-10-CM | POA: Diagnosis not present

## 2019-02-09 DIAGNOSIS — Z13820 Encounter for screening for osteoporosis: Secondary | ICD-10-CM | POA: Diagnosis not present

## 2019-06-07 DIAGNOSIS — K50819 Crohn's disease of both small and large intestine with unspecified complications: Secondary | ICD-10-CM | POA: Diagnosis not present

## 2019-06-07 DIAGNOSIS — Z1589 Genetic susceptibility to other disease: Secondary | ICD-10-CM | POA: Diagnosis not present

## 2019-06-07 DIAGNOSIS — Z23 Encounter for immunization: Secondary | ICD-10-CM | POA: Diagnosis not present

## 2019-06-07 DIAGNOSIS — M450 Ankylosing spondylitis of multiple sites in spine: Secondary | ICD-10-CM | POA: Diagnosis not present

## 2019-06-07 DIAGNOSIS — Z79899 Other long term (current) drug therapy: Secondary | ICD-10-CM | POA: Diagnosis not present

## 2019-07-06 ENCOUNTER — Encounter: Payer: Self-pay | Admitting: Family

## 2019-07-06 ENCOUNTER — Telehealth: Payer: Self-pay | Admitting: Obstetrics and Gynecology

## 2019-07-06 ENCOUNTER — Telehealth (INDEPENDENT_AMBULATORY_CARE_PROVIDER_SITE_OTHER): Payer: Federal, State, Local not specified - PPO | Admitting: Family

## 2019-07-06 ENCOUNTER — Other Ambulatory Visit: Payer: Self-pay

## 2019-07-06 VITALS — Temp 97.9°F | Ht 64.0 in | Wt 117.3 lb

## 2019-07-06 DIAGNOSIS — R5383 Other fatigue: Secondary | ICD-10-CM

## 2019-07-06 MED ORDER — VALACYCLOVIR HCL 500 MG PO TABS: ORAL_TABLET | ORAL | 0 refills | Status: DC

## 2019-07-06 NOTE — Telephone Encounter (Signed)
Spoke with patient, advised per Dr. Quincy Simmonds. Rx to verified pharmacy. Patient asking if she can apply any OTC cream to external labia lesion for comfort? Advised may apply OTC hydrocortisone cream. Patient has scheduled a virtual visit with her PCP for today.   Routing to provider for final review. Patient is agreeable to disposition. Will close encounter.

## 2019-07-06 NOTE — Telephone Encounter (Signed)
Spoke with patient. Hx of HSV 1 & 2. Reports oral and genital outbreak that started approximately 1 wk ago. Has been applying Abreva to oral outbreak, area is improving. Irritation from outbreak on left labia is also improving. Reports headache and soreness in right side of neck and feeling "run down, just wanting to rest for the past few days". Denies fever/chills, cough or sore throat. Never started Valtrex previously prescribed, was concerned about taking it and her medications that she take for RA.   Patient asking what she should do? OK to get new Rx for Valtrex?   Advised patient I will review Rx with Dr. Quincy Simmonds, should f/u with PCP for further evaluation if symptom worsen or do not resolve.   Dr. Quincy Simmonds -please review and advise.

## 2019-07-06 NOTE — Telephone Encounter (Signed)
Ok for Valtrex 500 mg po bid x 3 days prn outbreak.  Disp:  30, RF none.   I agree with evaluation with her PCP.

## 2019-07-06 NOTE — Progress Notes (Signed)
Virtual Visit via Video Note  I connected with Janice Zuniga on 07/06/19 at  4:40 PM EDT by a video enabled telemedicine application and verified that I am speaking with the correct person using two identifiers.  Location: Patient: home Provider: work   I discussed the limitations of evaluation and management by telemedicine and the availability of in person appointments. The patient expressed understanding and agreed to proceed.  History of Present Illness:  Patient reports that last week she had a cold sore on her lip. Started using abreva. 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 Reports increased pain in the genital region beginning Friday. Notes that Saturday she woke up with a headache.  Denies fever.  Reports feeling "like I was coming down with something."  Sunday had a headache as well.  Notes some general malaise. She reports that she feels exhausted. GYN filled her rx for valtrex.    She denies loss of taste or smell. Working from home. Family is healthy. Denies cough,SOB or sore throat.   Past Medical History:  Diagnosis Date  . Abnormal CT of the abdomen   . Abnormal uterine bleeding   . Anemia    iron def  . Ankylosing spondylitis ([place])   . Chicken pox   . Colitis   . Crohn disease ([place])   . Dysrhythmia    occassional fluttering noted by patient  . Genital warts   . Gluten intolerance   . History of chlamydia   . History of hysterectomy 08/21/15  . HLA B27 (HLA B27 positive)   . HSV-1 (herpes simplex virus 1) infection   . Low calcium levels   . Low iron   . Low serum vitamin D   . Muscle spasms of both lower extremities    Spasms in lower back  . Non-celiac gluten sensitivity   . Reactive airway disease   . Spondyloarthritis    genetic marker  . UTI (lower urinary tract infection)      Social History   Socioeconomic History  . Marital status: Married    Spouse name:  Not on file  . Number of children: Not on file  . Years of education: Not on file  . Highest education level: Not on file  Occupational History  . Not on file  Social Needs  . Financial resource strain: Not on file  . Food insecurity    Worry: Not on file    Inability: Not on file  . Transportation needs    Medical: Not on file    Non-medical: Not on file  Tobacco Use  . Smoking status: Never Smoker  . Smokeless tobacco: Never Used  Substance and Sexual Activity  . Alcohol use: Yes    Alcohol/week: 1.0 - 2.0 standard drinks    Types: 1 Glasses of wine per week  . Drug use: No  . Sexual activity: Yes    Partners: Male    Comment: Hyst  Lifestyle  . Physical activity    Days per week: Not on file    Minutes per session: Not on file  . Stress: Not on file  Relationships  . Social Herbalist on phone: Not on file    Gets together: Not on file    Attends religious service: Not on file    Active member of club or organization: Not on file    Attends meetings of  clubs or organizations: Not on file    Relationship status: Not on file  . Intimate partner violence    Fear of current or ex partner: Not on file    Emotionally abused: Not on file    Physically abused: Not on file    Forced sexual activity: Not on file  Other Topics Concern  . 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 [name]. [name].    Got B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 ABDOMINAL HYSTERECTOMY  08/21/15  . BILATERAL SALPINGECTOMY Bilateral 08/21/2015   Procedure: BILATERAL SALPINGECTOMY;  Surgeon: Nunzio Cobbs, MD;  Location: [facility];  Service: Gynecology;  Laterality: Bilateral;  . COLONOSCOPY    . CYSTOSCOPY  N/A 08/21/2015   Procedure: CYSTOSCOPY;  Surgeon: Nunzio Cobbs, MD;  Location: [facility];  Service: Gynecology;  Laterality: N/A;  . MOUTH SURGERY     cyst in mouth  . ROBOTIC ASSISTED TOTAL HYSTERECTOMY WITH SALPINGECTOMY Bilateral 08/21/2015   Procedure: ROBOTIC ASSISTED TOTAL HYSTERECTOMY ;  Surgeon: Nunzio Cobbs, MD;  Location: [facility];  Service: Gynecology;  Laterality: Bilateral;  . WISDOM TOOTH EXTRACTION      Family History  Problem Relation Age of Onset  . Hypertension Mother   . Diabetes Mother        ? father  . Anxiety disorder Mother   . Cancer Paternal Grandmother        ? type  . Diabetes Father   . Colon polyps Father   . Prostate cancer Father     Allergies  Allergen Reactions  . Gluten Meal Other (See Comments)    Auto immune  . Tramadol     nausea  . Codeine Nausea And Vomiting  . Peanut-Containing Drug Products Other (See Comments)    By allergy test...has eaten peanuts without issues     Current Outpatient Medications on File Prior to Visit  Medication Sig Dispense Refill  . albuterol (PROVENTIL HFA;VENTOLIN HFA) 108 (90 Base) MCG/ACT inhaler Inhale 2 puffs into the lungs every 6 (six) hours as needed for wheezing or shortness of breath. 1 Inhaler 0  . folic acid (FOLVITE) 1 MG tablet Take 1 tablet by mouth daily.    . methotrexate (RHEUMATREX) 2.5 MG tablet Take 8 mg by mouth once a week.     . valACYclovir (VALTREX) 500 MG tablet Take one tab PO bid x3 days prn for outbreak. 30 tablet 0  . Adalimumab 40 MG/0.8ML PNKT Inject into the skin.     No current facility-administered medications on file prior to visit.     Temp 97.9 F (36.6 C) (Oral)   Ht 5' 4"  (1.626 m)   Wt 117 lb 4.8 oz (53.2 kg)   LMP 08/07/2015   BMI 20.13 kg/m      Observations/Objective:   Gen: Awake, alert, no acute distress Resp: Breathing is even and non-labored Psych: calm/pleasant demeanor Neuro: Alert and Oriented x 3, + facial symmetry,  speech is clear.   Assessment and Plan:  Fatigue- New.  I suggested testing for COVID-19. Pt will present in the AM to test at our [facility]  test site.  I advised the pt that if her symptoms worsen or if not improved by Friday, we should re-evaluated her for other possible causes. Pt verbalizes understanding.   Follow Up Instructions:    I discussed the assessment and treatment plan with the patient. The patient was provided an opportunity to ask questions and all were answered. The patient agreed with the plan and demonstrated an understanding of the instructions.   The patient was advised to call back or seek an in-person evaluation if the symptoms worsen or if the condition fails to improve as anticipated.  Nance Pear, NP

## 2019-07-06 NOTE — Telephone Encounter (Signed)
Patient states she feels she may be having an outbreak, starting about a week ago. Would like to speak with nurse.

## 2019-07-07 ENCOUNTER — Other Ambulatory Visit: Payer: Self-pay

## 2019-07-07 DIAGNOSIS — Z20822 Contact with and (suspected) exposure to covid-19: Secondary | ICD-10-CM

## 2019-07-07 DIAGNOSIS — Z20828 Contact with and (suspected) exposure to other viral communicable diseases: Secondary | ICD-10-CM | POA: Diagnosis not present

## 2019-07-08 LAB — NOVEL CORONAVIRUS, NAA: SARS-CoV-2, NAA: NOT DETECTED

## 2019-07-09 ENCOUNTER — Telehealth: Payer: Self-pay | Admitting: Family

## 2019-07-09 NOTE — Telephone Encounter (Signed)
Was able to connect with patient and she reports she is feeling "much better"

## 2019-07-09 NOTE — Telephone Encounter (Signed)
Noted  

## 2019-07-09 NOTE — Telephone Encounter (Signed)
Attempted to reach patient to follow up on how she was feeling. No answer, no voicemail.   Rod Holler could you please try her again later? Her coronovirus testing is better.  If she is feeling better then she can lift her quarantine.

## 2019-09-08 DIAGNOSIS — M450 Ankylosing spondylitis of multiple sites in spine: Secondary | ICD-10-CM | POA: Diagnosis not present

## 2019-10-14 ENCOUNTER — Telehealth: Payer: Self-pay | Admitting: Obstetrics and Gynecology

## 2019-10-14 ENCOUNTER — Other Ambulatory Visit: Payer: Self-pay | Admitting: Obstetrics and Gynecology

## 2019-10-14 DIAGNOSIS — Z1231 Encounter for screening mammogram for malignant neoplasm of breast: Secondary | ICD-10-CM

## 2019-10-14 NOTE — Telephone Encounter (Addendum)
Spoke to pt. Pt states having breast sensitivty over the last couple weeks. Pt states having "twinges" in sides of breast. Pt states no pain and no nipple discharge. Has done self breast exams and all feels normal. Pt did state has gained weight during covid period and maybe that's why the change in size or senstitiy.  Pt scheduled to see Dr Quincy Simmonds on 10/19/2019 at 3:30pm and pt has scheduled  Screening MMG at [facility] on 11/23/2019 at 1030. Will review with Dr Quincy Simmonds and return call to pt with any additional recommendations.   Will route to Dr Quincy Simmonds for review and recommendations.

## 2019-10-14 NOTE — Telephone Encounter (Signed)
Patient is having sensitivity in both breast.

## 2019-10-15 NOTE — Telephone Encounter (Signed)
Patient may consider ibuprofen or tylenol for the discomfort and perhaps a different bra? We will reassess at her appointment on 1/26.

## 2019-10-15 NOTE — Telephone Encounter (Signed)
Spoke with patient, advised per Dr. Quincy Simmonds. Patient verbalizes understanding and is agreeable.  Encounter closed.

## 2019-10-18 NOTE — Progress Notes (Signed)
GYNECOLOGY  VISIT   HPI: 49 y.o.   Married  [place] American  female   [phone] with Patient's last menstrual period was 08/07/2015.   here for bilateral breast sensitivity x1 month.  Maybe had a change in her right breast when she checked, but she is not certain about this.   The sensitivity comes and goes, is generalized, and is improving.   Not using any hormonal treatments.   Doing healthy diet and herbal therapies.   No new exercise routine or trauma.   No FH breast cancer.   Gained 20 pounds.  Feels her bra is not comfortable for her and she does not use one regularly at home.  GYNECOLOGIC HISTORY: Patient's last menstrual period was 08/07/2015. Contraception: Hyst Menopausal hormone therapy: n/a Last mammogram: 12-22-17 Diag.Bil.W.Lt.Br.US/Neg/multiple benign cysts in Lt.Br./density C/screening 38yrBiRads2 Last pap smear: 03-31-15 Neg:Neg HR HPV; 2014 neg        OB History    Gravida  5   Para  3   Term  3   Preterm      AB  2   Living  3     SAB  1   TAB  1   Ectopic      Multiple      Live Births                 Patient Active Problem List   Diagnosis Date Noted  . Crohn disease ([place] 02/18/2017  . History of uveitis 02/18/2017  . Ankylosing spondylitis of multiple sites in spine ([facility] 01/29/2017  . Encounter for long-term (current) use of high-risk medication 01/29/2017  . Abnormal loss of weight 11/02/2016  . Crohn's disease of both small and large intestine with complication ([facility] 078/93/8101 . Elevated ferritin level 11/01/2016  . History of colitis 03/29/2016  . Elevated serum globulin level 12/29/2015  . Depression 09/15/2015  . Vitamin D deficiency 09/08/2015  . Anemia 09/08/2015  . Status post laparoscopic hysterectomy 08/21/2015  . Low calcium levels 07/14/2015  . Abnormal CT of the abdomen 04/24/2015  . Hematochezia 04/24/2015  . 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 Extrinsic asthma 07/12/2014  . Iron deficiency  anemia 07/11/2014  . HLA B27 positive 07/06/2014  . Low serum vitamin D   . Non-celiac gluten sensitivity   . 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 Abnormal CT of the abdomen   . Abnormal uterine bleeding   . Anemia    iron def  . Ankylosing spondylitis ([facility]   . Chicken pox   . Colitis   . Crohn disease ([facility]   . Dysrhythmia    occassional fluttering noted by patient  . Genital warts   . Gluten intolerance   . History of chlamydia   . History of hysterectomy 08/21/15  . HLA B27 (HLA B27 positive)   . HSV-1 (herpes simplex virus 1) infection   . Low calcium levels   . Low iron   . Low serum vitamin D   . Muscle spasms of both lower extremities    Spasms in lower back  . Non-celiac gluten sensitivity   . Reactive airway disease   . Spondyloarthritis    genetic marker  . UTI (lower urinary tract infection)     Past Surgical History:  Procedure Laterality Date  . ABDOMINAL HYSTERECTOMY  08/21/15  . BILATERAL SALPINGECTOMY Bilateral 08/21/2015   Procedure: BILATERAL SALPINGECTOMY;  Surgeon: BNunzio Cobbs MD;  Location: [facility];  Service: Gynecology;  Laterality: Bilateral;  . COLONOSCOPY    . CYSTOSCOPY N/A 08/21/2015   Procedure: CYSTOSCOPY;  Surgeon: Nunzio Cobbs, MD;  Location: [facility];  Service: Gynecology;  Laterality: N/A;  . MOUTH SURGERY     cyst in mouth  . ROBOTIC ASSISTED TOTAL HYSTERECTOMY WITH SALPINGECTOMY Bilateral 08/21/2015   Procedure: ROBOTIC ASSISTED TOTAL HYSTERECTOMY ;  Surgeon: Nunzio Cobbs, MD;  Location: [facility];  Service: Gynecology;  Laterality: Bilateral;  . WISDOM TOOTH EXTRACTION      Current Outpatient Medications  Medication Sig Dispense Refill  . albuterol (PROVENTIL HFA;VENTOLIN HFA) 108 (90 Base) MCG/ACT inhaler Inhale 2 puffs into the lungs every 6 (six) hours as needed for wheezing or shortness of breath. 1 Inhaler 0  . calcium gluconate 500 MG tablet Take 1 tablet  by mouth 3 (three) times daily.    . cyanocobalamin 100 MCG tablet Take 1 tablet by mouth daily.    . folic acid (FOLVITE) 1 MG tablet Take 1 tablet by mouth daily.    . methotrexate (RHEUMATREX) 2.5 MG tablet Take 8 mg by mouth once a week.     . valACYclovir (VALTREX) 500 MG tablet Take one tab PO bid x3 days prn for outbreak. 30 tablet 0  . Adalimumab 40 MG/0.8ML PNKT Inject into the skin.     No current facility-administered medications for this visit.     ALLERGIES: Gluten meal, Tramadol, Codeine, and Peanut-containing drug products  Family History  Problem Relation Age of Onset  . Hypertension Mother   . Diabetes Mother        ? father  . Anxiety disorder Mother   . Cancer Paternal Grandmother        ? type  . Diabetes Father   . Colon polyps Father   . Prostate cancer Father     Social History   Socioeconomic History  . Marital status: Married    Spouse name: Not on file  . Number of children: Not on file  . Years of education: Not on file  . Highest education level: Not on file  Occupational History  . Not on file  Tobacco Use  . Smoking status: Never Smoker  . Smokeless tobacco: Never Used  Substance and Sexual Activity  . Alcohol use: Yes    Alcohol/week: 1.0 - 2.0 standard drinks    Types: 1 Glasses of wine per week  . Drug use: No  . Sexual activity: Yes    Partners: Male    Comment: Hyst  Other Topics Concern  . 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 [name]. [name].    Got B[employer]S. 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 Difficulty of Paying Living Expenses: Not on file  Food Insecurity:   . Worried About Charity fundraiser in the Last Year: Not on file  . Ran Out of Food in  the Last Year: Not on file  Transportation Needs:   . Lack of Transportation (Medical): Not on file  . Lack of Transportation (Non-Medical): Not on file  Physical Activity:   . Days of Exercise per  Week: Not on file  . Minutes of Exercise per Session: Not on file  Stress:   . Feeling of Stress : Not on file  Social Connections:   . Frequency of Communication with Friends and Family: Not on file  . Frequency of Social Gatherings with Friends and Family: Not on file  . Attends Religious Services: Not on file  . Active Member of Clubs or Organizations: Not on file  . Attends Archivist Meetings: Not on file  . Marital Status: Not on file  Intimate Partner Violence:   . Fear of Current or Ex-Partner: Not on file  . Emotionally Abused: Not on file  . Physically Abused: Not on file  . Sexually Abused: Not on file    Review of Systems  All other systems reviewed and are negative.   PHYSICAL EXAMINATION:    BP 100/66   Pulse 70   Temp (!) 97.1 F (36.2 C) (Temporal)   Ht 5' 5"  (1.651 m)   Wt 120 lb 9.6 oz (54.7 kg)   LMP 08/07/2015   BMI 20.07 kg/m     General appearance: alert, cooperative and appears stated age   Breasts: normal appearance, no masses or tenderness, No nipple retraction or dimpling, No nipple discharge or bleeding, No axillary or supraclavicular adenopathy   Chaperone was present for exam.  ASSESSMENT  Bilateral breast tenderness.  Normal breast exam.  Weight gain.   PLAN  Try a new bra.  Use Tylenol. Ok to proceed with routine screening mammogram.  Return for annual exam.    An After Visit Summary was printed and given to the patient.  __20____ minutes face to face time of which over 50% was spent in counseling.

## 2019-10-19 ENCOUNTER — Encounter: Payer: Self-pay | Admitting: Obstetrics and Gynecology

## 2019-10-19 ENCOUNTER — Other Ambulatory Visit: Payer: Self-pay

## 2019-10-19 ENCOUNTER — Ambulatory Visit: Payer: Federal, State, Local not specified - PPO | Admitting: Obstetrics and Gynecology

## 2019-10-19 VITALS — BP 100/66 | HR 70 | Temp 97.1°F | Ht 65.0 in | Wt 120.6 lb

## 2019-10-19 DIAGNOSIS — N644 Mastodynia: Secondary | ICD-10-CM

## 2019-11-04 NOTE — Progress Notes (Signed)
49 y.o. [id number] Married [place] American female here for annual exam.    PCP:  [name], NP   Patient's last menstrual period was 08/07/2015.           Sexually active: Yes.    The current method of family planning is status post hysterectomy.    Exercising: Yes.    walks the dog Smoker:  no  Health Maintenance: Pap:  03-31-15 Neg:Neg HR HPV; 2014 neg History of abnormal Pap:  no MMG: 12-22-17 Diag.Bil.w/Lt.Br.US/Neg/multiple benign cysts with periareolar Lt.Br./density C/screening 90yrBiRads2 --appt. 11-23-19 Colonoscopy: 06-28-16 crohns colitis Dr.Armbruster--[facility]   [facility] pt. Would like today Gardasil:   no HIV: thinks 2017 Neg Hep C: 2017 Neg Screening Labs: PCP. Flu vaccine:  Fall, 2020.    reports that she has never smoked. She has never used smokeless tobacco. She reports current alcohol use of about 4.0 - 5.0 standard drinks of alcohol per week. She reports that she does not use drugs.  Past Medical History:  Diagnosis Date  . Abnormal CT of the abdomen   . Abnormal uterine bleeding   . Anemia    iron def  . Ankylosing spondylitis ([facility]   . Chicken pox   . Colitis   . Crohn disease ([place] [place]   . Dysrhythmia    occassional fluttering noted by patient  . Genital warts   . Gluten intolerance   . History of chlamydia   . History of hysterectomy 08/21/15  . HLA B27 (HLA B27 positive)   . HSV-1 (herpes simplex virus 1) infection   . Low calcium levels   . Low iron   . Low serum vitamin D   . Muscle spasms of both lower extremities    Spasms in lower back  . Non-celiac gluten sensitivity   . Reactive airway disease   . Spondyloarthritis    genetic marker  . UTI (lower urinary tract infection)     Past Surgical History:  Procedure Laterality Date  . ABDOMINAL HYSTERECTOMY  08/21/15  . BILATERAL SALPINGECTOMY Bilateral 08/21/2015   Procedure: BILATERAL SALPINGECTOMY;  Surgeon: BNunzio Cobbs MD;  [facility];  Service: Gynecology;  Laterality: Bilateral;  . COLONOSCOPY    . CYSTOSCOPY N/A 08/21/2015   Procedure: CYSTOSCOPY;  Surgeon: BNunzio Cobbs MD;  [facility];  Service: Gynecology;  Laterality: N/A;  . MOUTH SURGERY     cyst in mouth  . ROBOTIC ASSISTED TOTAL HYSTERECTOMY WITH SALPINGECTOMY Bilateral 08/21/2015   Procedure: ROBOTIC ASSISTED TOTAL HYSTERECTOMY ;  Surgeon: BNunzio Cobbs MD;  [facility];  Service: Gynecology;  Laterality: Bilateral;  . WISDOM TOOTH EXTRACTION      Current Outpatient Medications  Medication Sig Dispense Refill  . albuterol (PROVENTIL HFA;VENTOLIN HFA) 108 (90 Base) MCG/ACT inhaler Inhale 2 puffs into the lungs every 6 (six) hours as needed for wheezing or shortness of breath. 1 Inhaler 0  . calcium gluconate 500 MG tablet Take 1 tablet by mouth 3 (three) times daily.    . cyanocobalamin 100 MCG tablet Take 1 tablet by mouth daily.    . folic acid (FOLVITE) 1 MG tablet Take 1 tablet by mouth daily.    . methotrexate (RHEUMATREX) 2.5 MG tablet Take 8 mg by mouth once a week.     . valACYclovir (VALTREX) 500 MG tablet Take one tab PO bid x3 days prn for outbreak. 30 tablet  0  . Adalimumab 40 MG/0.8ML PNKT Inject into the skin.     No current facility-administered medications for this visit.    Family History  Problem Relation Age of Onset  . Hypertension Mother   . Diabetes Mother        ? father  . Anxiety disorder Mother   . Cancer Paternal Grandmother        ? type  . Diabetes Father   . Colon polyps Father   . Prostate cancer Father     Review of Systems  All other systems reviewed and are negative.   Exam:   BP 110/70   Pulse 76   Temp (!) 97.1 F (36.2 C) (Temporal)   Ht 5' 3"  (1.6 m)   Wt 121 lb 12.8 oz (55.2 kg)   LMP 08/07/2015   BMI 21.58 kg/m     General appearance: alert, cooperative and appears stated age Head: normocephalic, without obvious abnormality,  atraumatic Neck: no adenopathy, supple, symmetrical, trachea midline and thyroid normal to inspection and palpation Lungs: clear to auscultation bilaterally Breasts: normal appearance, no masses or tenderness, No nipple retraction or dimpling, No nipple discharge or bleeding, No axillary adenopathy Heart: regular rate and rhythm Abdomen: soft, non-tender; no masses, no organomegaly Extremities: extremities normal, atraumatic, no cyanosis or edema Skin: skin color, texture, turgor normal. No rashes or lesions Lymph nodes: cervical, supraclavicular, and axillary nodes normal. Neurologic: grossly normal  Pelvic: External genitalia:  no lesions              No abnormal inguinal nodes palpated.              Urethra:  normal appearing urethra with no masses, tenderness or lesions              Bartholins and Skenes: normal                 Vagina: normal appearing vagina with normal color and discharge, no lesions              Cervix: absent              Pap taken: No. Bimanual Exam:  Uterus:  absent              Adnexa: no mass, fullness, tenderness              Rectal exam: Yes. Confirms.              Anus:  normal sphincter tone, no lesions  Chaperone was present for exam.  Assessment:   Well woman visit with normal exam. Status post robotic TLH, bilateral salpingectomy.  HSV.  Oral and genital. Crohn's.  Plan: Mammogram screening discussed. Self breast awareness reviewed. Pap and HR HPV as above. Guidelines for Calcium, Vitamin D, regular exercise program including cardiovascular and weight bearing exercise. Rx for Valtrex for both oral and genital outbreak.  TDap.  Labs with PCP.  Follow up annually and prn.   After visit summary provided.

## 2019-11-08 ENCOUNTER — Ambulatory Visit: Payer: Federal, State, Local not specified - PPO | Admitting: Obstetrics and Gynecology

## 2019-11-08 ENCOUNTER — Encounter: Payer: Self-pay | Admitting: Obstetrics and Gynecology

## 2019-11-08 ENCOUNTER — Other Ambulatory Visit: Payer: Self-pay

## 2019-11-08 VITALS — BP 110/70 | HR 76 | Temp 97.1°F | Ht 63.0 in | Wt 121.8 lb

## 2019-11-08 DIAGNOSIS — Z23 Encounter for immunization: Secondary | ICD-10-CM

## 2019-11-08 DIAGNOSIS — Z01419 Encounter for gynecological examination (general) (routine) without abnormal findings: Secondary | ICD-10-CM | POA: Diagnosis not present

## 2019-11-08 MED ORDER — LIDOCAINE 5 % EX OINT: 1.0000 "application " | TOPICAL_OINTMENT | Freq: Three times a day (TID) | CUTANEOUS | 1 refills | Status: DC

## 2019-11-08 MED ORDER — VALACYCLOVIR HCL 500 MG PO TABS: ORAL_TABLET | ORAL | 2 refills | Status: DC

## 2019-11-08 NOTE — Patient Instructions (Signed)

## 2019-11-23 ENCOUNTER — Ambulatory Visit: Payer: Federal, State, Local not specified - PPO

## 2019-11-30 DIAGNOSIS — Z20822 Contact with and (suspected) exposure to covid-19: Secondary | ICD-10-CM | POA: Diagnosis not present

## 2019-12-13 ENCOUNTER — Encounter: Payer: Self-pay | Admitting: Certified Nurse Midwife

## 2019-12-13 DIAGNOSIS — Z79899 Other long term (current) drug therapy: Secondary | ICD-10-CM | POA: Diagnosis not present

## 2019-12-13 DIAGNOSIS — M450 Ankylosing spondylitis of multiple sites in spine: Secondary | ICD-10-CM | POA: Diagnosis not present

## 2019-12-13 DIAGNOSIS — Z1589 Genetic susceptibility to other disease: Secondary | ICD-10-CM | POA: Diagnosis not present

## 2019-12-21 ENCOUNTER — Other Ambulatory Visit: Payer: Self-pay

## 2019-12-21 ENCOUNTER — Ambulatory Visit: Payer: Federal, State, Local not specified - PPO

## 2019-12-21 ENCOUNTER — Ambulatory Visit
Admission: RE | Admit: 2019-12-21 | Discharge: 2019-12-21 | Disposition: A | Discharge status: Home / Self Care | Payer: Federal, State, Local not specified - PPO | Source: Ambulatory Visit | Attending: Obstetrics and Gynecology | Admitting: Obstetrics and Gynecology

## 2019-12-21 DIAGNOSIS — Z1231 Encounter for screening mammogram for malignant neoplasm of breast: Secondary | ICD-10-CM

## 2019-12-23 ENCOUNTER — Other Ambulatory Visit: Payer: Self-pay | Admitting: Obstetrics and Gynecology

## 2019-12-23 DIAGNOSIS — R928 Other abnormal and inconclusive findings on diagnostic imaging of breast: Secondary | ICD-10-CM

## 2020-01-05 ENCOUNTER — Ambulatory Visit
Admission: RE | Admit: 2020-01-05 | Discharge: 2020-01-05 | Disposition: A | Discharge status: Home / Self Care | Payer: Federal, State, Local not specified - PPO | Source: Ambulatory Visit | Attending: Obstetrics and Gynecology | Admitting: Obstetrics and Gynecology

## 2020-01-05 ENCOUNTER — Other Ambulatory Visit: Payer: Self-pay

## 2020-01-05 DIAGNOSIS — R928 Other abnormal and inconclusive findings on diagnostic imaging of breast: Secondary | ICD-10-CM

## 2020-01-05 DIAGNOSIS — N6001 Solitary cyst of right breast: Secondary | ICD-10-CM | POA: Diagnosis not present

## 2020-01-05 DIAGNOSIS — R922 Inconclusive mammogram: Secondary | ICD-10-CM | POA: Diagnosis not present

## 2020-01-24 ENCOUNTER — Telehealth: Payer: Self-pay | Admitting: Obstetrics and Gynecology

## 2020-01-24 NOTE — Telephone Encounter (Signed)
Patient is having vaginal irritation. Sending to triage to assist with scheduling.

## 2020-01-24 NOTE — Telephone Encounter (Signed)
Spoke with patient. Patient reports vaginal irritation, itching and "slight" increase in vaginal d/c, no odor. HSV outbreak last week, took valtrex with outbreak as prescribed. States she started a new intense exercise program last week and has some increased life stressors going on. Rested through the weekend, symptoms have been "suddle" for the past couple of days. Denies any vaginal lesions, vaginal bleeding, pelvic pain, fever/chills.   Reviewed option of OTC Monistat or OV. Patient would like to try OTC Monistat 3 day first. Advised patient if symptoms worsen, new symptoms develop or symptoms do not resolve, return call to office to schedule OV. Advised patient I will update Dr. Quincy Simmonds and our office will return call if any additional recommendations.   Routing to provider for final review. Patient is agreeable to disposition. Will close encounter.

## 2020-03-12 DIAGNOSIS — N3001 Acute cystitis with hematuria: Secondary | ICD-10-CM | POA: Diagnosis not present

## 2020-03-13 ENCOUNTER — Telehealth: Payer: Federal, State, Local not specified - PPO

## 2020-03-13 ENCOUNTER — Telehealth: Payer: Self-pay

## 2020-03-13 NOTE — Telephone Encounter (Signed)
Nurse Assessment Nurse: Zenia Resides, RN, Diane Date/Time Eilene Ghazi Time): 03/13/2020 7:47:56 AM Confirm and document reason for call. If symptomatic, describe symptoms. ---Caller states she was seen in [facility] yesterday morning and put on Macrobid. She has taken 2 doses. Pt. states she feels a little bloated, has pain with urination at the end of her stream. Can urinate okay. Chills have resolved. No low back or side pain. No blood in urine. Has the patient had close contact with a person known or suspected to have the novel coronavirus illness OR traveled / lives in area with major community spread (including international travel) in the last 14 days from the onset of symptoms? * If Asymptomatic, screen for exposure and travel within the last 14 days. ---No Does the patient have any new or worsening symptoms? ---Yes Will a triage be completed? ---Yes Related visit to physician within the last 2 weeks? ---Yes Does the PT have any chronic conditions? (i.e. diabetes, asthma, this includes High risk factors for pregnancy, etc.) ---Yes List chronic conditions. ---chron's Is the patient pregnant or possibly pregnant? (Ask all females between the ages of 44-55) ---No Is this a behavioral health or substance abuse call? ---NoPLEASE NOTE: All timestamps contained within this report are represented as [place] Standard Time. CONFIDENTIALTY NOTICE: This fax transmission is intended only for the addressee. It contains information that is legally privileged, confidential or otherwise protected from use or disclosure. If you are not the intended recipient, you are strictly prohibited from reviewing, disclosing, copying using or disseminating any of this information or taking any action in reliance on or regarding this information. If you have received this fax in error, please notify us immediately by telephone so that we can arrange for its return to [place]. Phone: [phone], Toll-Free: [phone], Fax:  [phone] Page: 2 of 2 Call Id: [id number] Guidelines Guideline Title Affirmed Question Affirmed Notes Nurse Date/Time Eilene Ghazi Time) Urinary Tract Infection on Antibiotic Follow-up Call - Female [1] Taking antibiotic < 24 hours for UTI AND [2] fever persists Zenia Resides, RN, Diane 03/13/2020 7:51:30 AM Disp. Time Eilene Ghazi Time) Disposition Final User 03/13/2020 7:53:50 AM Home Care Yes Zenia Resides, RN, Diane

## 2020-04-26 NOTE — Progress Notes (Signed)
GYNECOLOGY  VISIT   HPI: 49 y.o.   Married  [place] American  female   [phone] with Patient's last menstrual period was 08/07/2015.   here for vaginal irritation, slight itching and slight lower bloating. Patient denies any vaginal odor.  Thinks she may have a yeast infection.  Feeling burning and irritation.  She tried a boric acid vaginal suppository last night, and it gave her some relief.   She notices increase in symptoms following intercourse.   Uses Dove soap.   Daughter is starting at [employer] [facility].   GYNECOLOGIC HISTORY: Patient's last menstrual period was 08/07/2015. Contraception:  Hyst Menopausal hormone therapy: none Last mammogram: 12-21-19 3D/Rt.Br.poss.mass;Lt.Br.Neg--Rt.Diag.w/US/density C/Rt.breast cyst/Neg/screening 21yrBiRads2 Last pap smear:   03-31-15 Neg:Neg HR HPV, 2014 Neg        OB History    Gravida  5   Para  3   Term  3   Preterm      AB  2   Living  3     SAB  1   TAB  1   Ectopic      Multiple      Live Births                 Patient Active Problem List   Diagnosis Date Noted  . Crohn disease ([facility] 02/18/2017  . History of uveitis 02/18/2017  . Ankylosing spondylitis of multiple sites in spine ([place] 01/29/2017  . Encounter for long-term (current) use of high-risk medication 01/29/2017  . Abnormal loss of weight 11/02/2016  . Crohn's disease of both small and large intestine with complication ([facility] 083/38/2505 . Elevated ferritin level 11/01/2016  . History of colitis 03/29/2016  . Elevated serum globulin level 12/29/2015  . Depression 09/15/2015  . Vitamin D deficiency 09/08/2015  . Anemia 09/08/2015  . Status post laparoscopic hysterectomy 08/21/2015  . Low calcium levels 07/14/2015  . Abnormal CT of the abdomen 04/24/2015  . Hematochezia 04/24/2015  . 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 Extrinsic asthma 07/12/2014  . Iron deficiency anemia 07/11/2014  . HLA B27 positive 07/06/2014  . Low serum vitamin D   .  Non-celiac gluten sensitivity   . 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 Abnormal CT of the abdomen   . Abnormal uterine bleeding   . Anemia    iron def  . Ankylosing spondylitis ([facility]   . Chicken pox   . Colitis   . Crohn disease ([facility]   . Dysrhythmia    occassional fluttering noted by patient  . Genital warts   . Gluten intolerance   . History of chlamydia   . History of hysterectomy 08/21/15  . HLA B27 (HLA B27 positive)   . HSV-1 (herpes simplex virus 1) infection   . Low calcium levels   . Low iron   . Low serum vitamin D   . Muscle spasms of both lower extremities    Spasms in lower back  . Non-celiac gluten sensitivity   . Reactive airway disease   . Spondyloarthritis    genetic marker  . UTI (lower urinary tract infection)     Past Surgical History:  Procedure Laterality Date  . ABDOMINAL HYSTERECTOMY  08/21/15  . BILATERAL SALPINGECTOMY Bilateral 08/21/2015   Procedure: BILATERAL SALPINGECTOMY;  Surgeon: BNunzio Cobbs MD;  [facility];  Service: Gynecology;  Laterality: Bilateral;  . COLONOSCOPY    . CYSTOSCOPY N/A 08/21/2015   Procedure: CYSTOSCOPY;  Surgeon: Nunzio Cobbs, MD;  Location: [facility];  Service: Gynecology;  Laterality: N/A;  . MOUTH SURGERY     cyst in mouth  . ROBOTIC ASSISTED TOTAL HYSTERECTOMY WITH SALPINGECTOMY Bilateral 08/21/2015   Procedure: ROBOTIC ASSISTED TOTAL HYSTERECTOMY ;  Surgeon: Nunzio Cobbs, MD;  Location: [facility];  Service: Gynecology;  Laterality: Bilateral;  . WISDOM TOOTH EXTRACTION      Current Outpatient Medications  Medication Sig Dispense Refill  . albuterol (PROVENTIL HFA;VENTOLIN HFA) 108 (90 Base) MCG/ACT inhaler Inhale 2 puffs into the lungs every 6 (six) hours as needed for wheezing or shortness of breath. 1 Inhaler 0  . calcium gluconate 500 MG tablet Take 1 tablet by mouth 3 (three) times daily.    . cyanocobalamin 100 MCG tablet Take 1  tablet by mouth daily.    . folic acid (FOLVITE) 1 MG tablet Take 1 tablet by mouth daily.    . methotrexate (RHEUMATREX) 2.5 MG tablet Take by mouth.    . valACYclovir (VALTREX) 500 MG tablet Take one tab (500 mg) PO bid x 3 days prn for genital outbreak.  Take 4 tabs (2000 mg) orally every 12 hours for 24 hours for oral outbreak. 30 tablet 2  . Adalimumab 40 MG/0.8ML PNKT Inject into the skin.     No current facility-administered medications for this visit.     ALLERGIES: Gluten meal, Tramadol, Codeine, and Peanut-containing drug products  Family History  Problem Relation Age of Onset  . Hypertension Mother   . Diabetes Mother        ? father  . Anxiety disorder Mother   . Cancer Paternal Grandmother        ? type  . Diabetes Father   . Colon polyps Father   . Prostate cancer Father     Social History   Socioeconomic History  . Marital status: Married    Spouse name: Not on file  . Number of children: Not on file  . Years of education: Not on file  . Highest education level: Not on file  Occupational History  . Not on file  Tobacco Use  . Smoking status: Never Smoker  . Smokeless tobacco: Never Used  Vaping Use  . Vaping Use: Never used  Substance and Sexual Activity  . Alcohol use: Yes    Alcohol/week: 4.0 - 5.0 standard drinks    Types: 4 - 5 Glasses of wine per week  . Drug use: No  . Sexual activity: Yes    Partners: Male    Comment: Hyst  Other Topics Concern  . 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 [name]. [name].    Got B.S. 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 Difficulty of Paying Living Expenses:   Food Insecurity:   . Worried About Charity fundraiser in the Last Year:    . Arboriculturist in the Last Year:   Transportation Needs:   . Film/video editor (Medical):   Marland Kitchen Lack of Transportation (Non-Medical):   Physical Activity:   . Days of Exercise per Week:   . Minutes of Exercise per Session:  Stress:   . Feeling of Stress :   Social Connections:   . Frequency of Communication with Friends and Family:   . Frequency of Social Gatherings with Friends and Family:   . Attends Religious Services:   . Active Member of Clubs or Organizations:   . Attends Archivist Meetings:   Marland Kitchen Marital Status:   Intimate Partner Violence:   . Fear of Current or Ex-Partner:   . Emotionally Abused:   Marland Kitchen Physically Abused:   . Sexually Abused:     Review of Systems  All other systems reviewed and are negative.   PHYSICAL EXAMINATION:    BP 114/72   Pulse 60   Ht _0  (1.6 m)   Wt 122 lb 9.6 oz (55.6 kg)   LMP 08/07/2015   BMI 21.72 kg/m     General appearance: alert, cooperative and appears stated age   Pelvic: External genitalia:  no lesions              Urethra:  normal appearing urethra with no masses, tenderness or lesions              Bartholins and Skenes: normal                 Vagina: normal appearing vagina with normal color and discharge, no lesions              Cervix: absent                Bimanual Exam:  Uterus:  absent              Adnexa: no mass, fullness, tenderness         Chaperone was present for exam.  ASSESSMENT  Vaginitis.   PLAN  We discussed forms of vaginitis, including atrophy.  Affirm taken.  Mycolog II.  Final tx to follow.

## 2020-04-27 ENCOUNTER — Ambulatory Visit: Payer: Federal, State, Local not specified - PPO | Admitting: Obstetrics and Gynecology

## 2020-04-27 ENCOUNTER — Encounter: Payer: Self-pay | Admitting: Obstetrics and Gynecology

## 2020-04-27 ENCOUNTER — Other Ambulatory Visit: Payer: Self-pay

## 2020-04-27 VITALS — BP 114/72 | HR 60 | Ht 63.0 in | Wt 122.6 lb

## 2020-04-27 DIAGNOSIS — N760 Acute vaginitis: Secondary | ICD-10-CM

## 2020-04-27 MED ORDER — NYSTATIN-TRIAMCINOLONE 100000-0.1 UNIT/GM-% EX CREA: 1.0000 "application " | TOPICAL_CREAM | Freq: Two times a day (BID) | CUTANEOUS | 0 refills | Status: DC

## 2020-04-27 NOTE — Patient Instructions (Signed)
Vaginitis Vaginitis is a condition in which the vaginal tissue swells and becomes red (inflamed). This condition is most often caused by a change in the normal balance of bacteria and yeast that live in the vagina. This change causes an overgrowth of certain bacteria or yeast, which causes the inflammation. There are different types of vaginitis, but the most common types are:  Bacterial vaginosis.  Yeast infection (candidiasis).  Trichomoniasis vaginitis. This is a sexually transmitted disease (STD).  Viral vaginitis.  Atrophic vaginitis.  Allergic vaginitis. What are the causes? The cause of this condition depends on the type of vaginitis. It can be caused by:  Bacteria (bacterial vaginosis).  Yeast, which is a fungus (yeast infection).  A parasite (trichomoniasis vaginitis).  A virus (viral vaginitis).  Low hormone levels (atrophic vaginitis). Low hormone levels can occur during pregnancy, breastfeeding, or after menopause.  Irritants, such as bubble baths, scented tampons, and feminine sprays (allergic vaginitis). Other factors can change the normal balance of the yeast and bacteria that live in the vagina. These include:  Antibiotic medicines.  Poor hygiene.  Diaphragms, vaginal sponges, spermicides, birth control pills, and intrauterine devices (IUD).  Sex.  Infection.  Uncontrolled diabetes.  A weakened defense (immune) system. What increases the risk? This condition is more likely to develop in women who:  Smoke.  Use vaginal douches, scented tampons, or scented sanitary pads.  Wear tight-fitting pants.  Wear thong underwear.  Use oral birth control pills or an IUD.  Have sex without a condom.  Have multiple sex partners.  Have an STD.  Frequently use the spermicide nonoxynol-9.  Eat lots of foods high in sugar.  Have uncontrolled diabetes.  Have low estrogen levels.  Have a weakened immune system from an immune disorder or medical  treatment.  Are pregnant or breastfeeding. What are the signs or symptoms? Symptoms vary depending on the cause of the vaginitis. Common symptoms include:  Abnormal vaginal discharge. ? The discharge is white, gray, or yellow with bacterial vaginosis. ? The discharge is thick, white, and cheesy with a yeast infection. ? The discharge is frothy and yellow or greenish with trichomoniasis.  A bad vaginal smell. The smell is fishy with bacterial vaginosis.  Vaginal itching, pain, or swelling.  Sex that is painful.  Pain or burning when urinating. Sometimes there are no symptoms. How is this diagnosed? This condition is diagnosed based on your symptoms and medical history. A physical exam, including a pelvic exam, will also be done. You may also have other tests, including:  Tests to determine the pH level (acidity or alkalinity) of your vagina.  A whiff test, to assess the odor that results when a sample of your vaginal discharge is mixed with a potassium hydroxide solution.  Tests of vaginal fluid. A sample will be examined under a microscope. How is this treated? Treatment varies depending on the type of vaginitis you have. Your treatment may include:  Antibiotic creams or pills to treat bacterial vaginosis and trichomoniasis.  Antifungal medicines, such as vaginal creams or suppositories, to treat a yeast infection.  Medicine to ease discomfort if you have viral vaginitis. Your sexual partner should also be treated.  Estrogen delivered in a cream, pill, suppository, or vaginal ring to treat atrophic vaginitis. If vaginal dryness occurs, lubricants and moisturizing creams may help. You may need to avoid scented soaps, sprays, or douches.  Stopping use of a product that is causing allergic vaginitis. Then using a vaginal cream to treat the symptoms. Follow   these instructions at home: Lifestyle  Keep your genital area clean and dry. Avoid soap, and only rinse the area with  water.  Do not douche or use tampons until your health care provider says it is okay to do so. Use sanitary pads, if needed.  Do not have sex until your health care provider approves. When you can return to sex, practice safe sex and use condoms.  Wipe from front to back. This avoids the spread of bacteria from the rectum to the vagina. General instructions  Take over-the-counter and prescription medicines only as told by your health care provider.  If you were prescribed an antibiotic medicine, take or use it as told by your health care provider. Do not stop taking or using the antibiotic even if you start to feel better.  Keep all follow-up visits as told by your health care provider. This is important. How is this prevented?  Use mild, non-scented products. Do not use things that can irritate the vagina, such as fabric softeners. Avoid the following products if they are scented: ? Feminine sprays. ? Detergents. ? Tampons. ? Feminine hygiene products. ? Soaps or bubble baths.  Let air reach your genital area. ? Wear cotton underwear to reduce moisture buildup. ? Avoid wearing underwear while you sleep. ? Avoid wearing tight pants and underwear or nylons without a cotton panel. ? Avoid wearing thong underwear.  Take off any wet clothing, such as bathing suits, as soon as possible.  Practice safe sex and use condoms. Contact a health care provider if:  You have abdominal pain.  You have a fever.  You have symptoms that last for more than 2-3 days. Get help right away if:  You have a fever and your symptoms suddenly get worse. Summary  Vaginitis is a condition in which the vaginal tissue becomes inflamed.This condition is most often caused by a change in the normal balance of bacteria and yeast that live in the vagina.  Treatment varies depending on the type of vaginitis you have.  Do not douche, use tampons , or have sex until your health care provider approves. When  you can return to sex, practice safe sex and use condoms. This information is not intended to replace advice given to you by your health care provider. Make sure you discuss any questions you have with your health care provider. Document Revised: 08/22/2017 Document Reviewed: 10/15/2016 Elsevier Patient Education  2020 Elsevier Inc.  

## 2020-04-28 ENCOUNTER — Other Ambulatory Visit: Payer: Self-pay | Admitting: *Deleted

## 2020-04-28 LAB — VAGINITIS/VAGINOSIS, DNA PROBE
Candida Species: POSITIVE — AB
Gardnerella vaginalis: NEGATIVE
Trichomonas vaginosis: NEGATIVE

## 2020-04-28 MED ORDER — FLUCONAZOLE 150 MG PO TABS: ORAL_TABLET | ORAL | 0 refills | Status: DC

## 2020-06-06 DIAGNOSIS — R300 Dysuria: Secondary | ICD-10-CM | POA: Diagnosis not present

## 2020-06-06 DIAGNOSIS — N3000 Acute cystitis without hematuria: Secondary | ICD-10-CM | POA: Diagnosis not present

## 2020-06-07 ENCOUNTER — Encounter: Payer: Self-pay | Admitting: Family

## 2020-06-07 ENCOUNTER — Other Ambulatory Visit: Payer: Self-pay

## 2020-06-07 ENCOUNTER — Ambulatory Visit: Payer: Federal, State, Local not specified - PPO | Admitting: Family

## 2020-06-07 VITALS — BP 109/80 | HR 72 | Temp 98.5°F | Resp 16 | Ht 63.0 in | Wt 122.0 lb

## 2020-06-07 DIAGNOSIS — M79672 Pain in left foot: Secondary | ICD-10-CM

## 2020-06-07 DIAGNOSIS — N3000 Acute cystitis without hematuria: Secondary | ICD-10-CM

## 2020-06-07 MED ORDER — CEPHALEXIN 500 MG PO CAPS: 500.0000 mg | ORAL_CAPSULE | Freq: Three times a day (TID) | ORAL | 0 refills | Status: DC

## 2020-06-07 MED ORDER — MELOXICAM 7.5 MG PO TABS: 7.5000 mg | ORAL_TABLET | Freq: Every day | ORAL | 0 refills | Status: DC | PRN

## 2020-06-07 NOTE — Patient Instructions (Signed)
Please continue cipro. Let me know if your symptoms do not continue to improve. Begin meloxicam once daily as needed for your left foot pain.  Wear good supportive tennis shoes.

## 2020-06-07 NOTE — Progress Notes (Signed)
Subjective:    Patient ID: Janice Zuniga, female    DOB: 03-15-1971, 49 y.o.   MRN: [id number]  HPI  Patient is a 49 yr old female who presents today with chief complaint of UTI. She was seen at [facility] yesterday and placed on cipro 571m bid.    Reports that she had UTI 03/12/2020 (reports E. Coli).  She reports that she typically has UTI's after intercourse. She is concerned about the recurrent nature of her UTI's.   Reports that she is feeling a little bit better today.  [facility]   Reports some pain in the left foot.  Reports it causes her to limp sometimes. Denies injury.  She does wear flip flops/sandals a lot.  Working from home.  Review of Systems See HPI  Past Medical History:  Diagnosis Date  . Abnormal CT of the abdomen   . Abnormal uterine bleeding   . Anemia    iron def  . Ankylosing spondylitis ([facility]   . Chicken pox   . Colitis   . Crohn disease ([facility]   . Dysrhythmia    occassional fluttering noted by patient  . Genital warts   . Gluten intolerance   . History of chlamydia   . History of hysterectomy 08/21/15  . HLA B27 (HLA B27 positive)   . HSV-1 (herpes simplex virus 1) infection   . Low calcium levels   . Low iron   . Low serum vitamin D   . Muscle spasms of both lower extremities    Spasms in lower back  . Non-celiac gluten sensitivity   . Reactive airway disease   . Spondyloarthritis    genetic marker  . UTI (lower urinary tract infection)      Social History   Socioeconomic History  . Marital status: Married    Spouse name: Not on file  . Number of children: Not on file  . Years of education: Not on file  . Highest education level: Not on file  Occupational History  . Not on file  Tobacco Use  . Smoking status: Never Smoker  . Smokeless tobacco: Never Used  Vaping Use  . Vaping Use: Never used  Substance and Sexual Activity  . Alcohol use: Yes    Alcohol/week: 4.0 - 5.0 standard drinks    Types: 4 - 5  Glasses of wine per week  . Drug use: No  . Sexual activity: Yes    Partners: Male    Comment: Hyst  Other Topics Concern  . 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 JLarkin Ina'95. [name].    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 Difficulty of Paying Living Expenses: Not on file  Food Insecurity:   . Worried About RCharity fundraiserin the Last Year: Not on file  . Ran Out of Food in the Last Year: Not on file  Transportation Needs:   . Lack of Transportation (Medical): Not on file  . Lack of Transportation (Non-Medical): Not on file  Physical Activity:   . Days of Exercise per Week: Not on file  . Minutes of Exercise per Session:  Not on file  Stress:   . Feeling of Stress : Not on file  Social Connections:   . Frequency of Communication with Friends and Family: Not on file  . Frequency of Social Gatherings with Friends and Family: Not on file  . Attends Religious Services: Not on file  . Active Member of Clubs or Organizations: Not on file  . Attends Archivist Meetings: Not on file  . Marital Status: Not on file  Intimate Partner Violence:   . Fear of Current or Ex-Partner: Not on file  . Emotionally Abused: Not on file  . Physically Abused: Not on file  . Sexually Abused: Not on file    Past Surgical History:  Procedure Laterality Date  . ABDOMINAL HYSTERECTOMY  08/21/15  . BILATERAL SALPINGECTOMY Bilateral 08/21/2015   Procedure: BILATERAL SALPINGECTOMY;  Surgeon: Nunzio Cobbs, MD;  Location: [facility];  Service: Gynecology;  Laterality: Bilateral;  . COLONOSCOPY    . CYSTOSCOPY N/A 08/21/2015   Procedure: CYSTOSCOPY;  Surgeon: Nunzio Cobbs, MD;  Location: [facility];  Service: Gynecology;  Laterality: N/A;  . MOUTH SURGERY     cyst in mouth  . ROBOTIC ASSISTED TOTAL HYSTERECTOMY WITH SALPINGECTOMY Bilateral 08/21/2015   Procedure: ROBOTIC ASSISTED TOTAL HYSTERECTOMY ;  Surgeon: Nunzio Cobbs, MD;  Location: [facility];  Service: Gynecology;  Laterality: Bilateral;  . WISDOM TOOTH EXTRACTION      Family History  Problem Relation Age of Onset  . Hypertension Mother   . Diabetes Mother        ? father  . Anxiety disorder Mother   . Cancer Paternal Grandmother        ? type  . Diabetes Father   . Colon polyps Father   . Prostate cancer Father     Allergies  Allergen Reactions  . Gluten Meal Other (See Comments)    Auto immune  . Tramadol     nausea  . Codeine Nausea And Vomiting  . Peanut-Containing Drug Products Other (See Comments)    By allergy test...has eaten peanuts without issues     Current Outpatient Medications on File Prior to Visit  Medication Sig Dispense Refill  . albuterol (PROVENTIL HFA;VENTOLIN HFA) 108 (90 Base) MCG/ACT inhaler Inhale 2 puffs into the lungs every 6 (six) hours as needed for wheezing or shortness of breath. 1 Inhaler 0  . calcium gluconate 500 MG tablet Take 1 tablet by mouth 3 (three) times daily.    . ciprofloxacin (CIPRO) 500 MG tablet Take by mouth.    . folic acid (FOLVITE) 1 MG tablet Take 1 tablet by mouth daily.    . methotrexate (RHEUMATREX) 2.5 MG tablet Take by mouth.    . nystatin-triamcinolone (MYCOLOG II) cream Apply 1 application topically 2 (two) times daily. Apply to affected area BID for up to 7 days. 60 g 0  . valACYclovir (VALTREX) 500 MG tablet Take one tab (500 mg) PO bid x 3 days prn for genital outbreak.  Take 4 tabs (2000 mg) orally every 12 hours for 24 hours for oral outbreak. 30 tablet 2  . Adalimumab 40 MG/0.8ML PNKT Inject into the skin.    . cyanocobalamin 100 MCG tablet Take 1 tablet by mouth daily.    . fluconazole (DIFLUCAN) 150 MG tablet Take one tablet now PO,  may repeat in 72 hours if symptoms still present. 2 tablet 0   No current facility-administered medications on file prior to  visit.    BP 109/80 (BP Location: Right Arm, Patient Position: Sitting, Cuff Size: Small)   Pulse 72   Temp 98.5 F (36.9 C) (Oral)   Resp 16   Ht 5' 3"  (1.6 m)   Wt 122 lb (55.3 kg)   LMP 08/07/2015   SpO2 98%   BMI 21.61 kg/m       Objective:   Physical Exam Constitutional:      Appearance: Normal appearance. She is well-developed.  Neck:     Thyroid: No thyromegaly.  Cardiovascular:     Rate and Rhythm: Normal rate and regular rhythm.     Heart sounds: Normal heart sounds. No murmur heard.   Pulmonary:     Effort: Pulmonary effort is normal. No respiratory distress.     Breath sounds: Normal breath sounds. No wheezing.  Musculoskeletal:     Cervical back: Neck supple.  Feet:     Comments: Left foot is without swelling/redness/tenderness Skin:    General: Skin is warm and dry.  Neurological:     Mental Status: She is alert and oriented to person, place, and time.  Psychiatric:        Behavior: Behavior normal.        Thought Content: Thought content normal.        Judgment: Judgment normal.           Assessment & Plan:  UTI- clinically improving with cipro.  She is concerned about the recurrent nature of her UTI's.  We discussed possibility of post-coital antibiotic prophylaxis, but I would prefer to hold of on this for now and consider if we see more of a pattern. I did give her an rx to hold for recurrent UTI sxs.  For now she will continue cipro.  Left foot pain- new. Recommended that she avoid going barefoot/sandals and instead wear good tennis shoes. Rx with short course of prn meloxicam. She will let me know if her pain worsens or if it does not improve.  This visit occurred during the SARS-CoV-2 public health emergency.  Safety protocols were in place, including screening questions prior to the visit, additional usage of staff  PPE, and extensive cleaning of exam room while observing appropriate contact time as indicated for disinfecting solutions.

## 2020-06-08 ENCOUNTER — Encounter: Payer: Self-pay | Admitting: Family

## 2020-06-09 ENCOUNTER — Telehealth: Payer: Self-pay | Admitting: Obstetrics and Gynecology

## 2020-06-09 MED ORDER — FLUCONAZOLE 150 MG PO TABS
ORAL_TABLET | ORAL | 0 refills | Status: DC
Start: 2020-06-09 — End: 2020-08-03

## 2020-06-09 NOTE — Telephone Encounter (Signed)
Spoke with patient. Patient was seen by [facility] on 9/14 for dysuria. Had taken Azo, urine culture was collected, started on Cipro 500 mg bid x5 days. Patient reports she is feeling better today, is unsure if she has developed a yeast infection, was feeling some vaginal discomfort on 9/16. She had one fluconazole tablet on hand, she took it last night. She is waiting for UC results.  Denies vaginal itching, odor, bleeding, dysuria. She is unsure if she is seeing white vaginal d/c. Requesting to schedule OV for yeast. OV scheduled for 9/20 at 10:45am, arrive at 10:30am with Dr. Quincy Simmonds. Patient will f/u with [facility] for final urine culture results, will have copy faxed to Dr. Quincy Simmonds or will send in [place] message. Will continue Cipro as prescribed for now.   Advised Dr. Quincy Simmonds is out of the office today, I will update her, our office will call if any additional recommendations. Patient agreeable.  06/06/20 [facility] notes available in Care Everywhere, no results available.   Routing to provider for final review. Patient is agreeable to disposition. Will close encounter.

## 2020-06-09 NOTE — Telephone Encounter (Signed)
Patient thinks she had a uti and that possibly a yeast infection. She self treated with ciprofloxacin and does feel some relief and wonders what to do at this point?

## 2020-06-12 ENCOUNTER — Ambulatory Visit: Payer: Federal, State, Local not specified - PPO | Admitting: Obstetrics and Gynecology

## 2020-06-12 ENCOUNTER — Encounter: Payer: Self-pay | Admitting: Obstetrics and Gynecology

## 2020-06-12 ENCOUNTER — Other Ambulatory Visit: Payer: Self-pay

## 2020-06-12 VITALS — BP 112/66 | HR 76 | Ht 63.0 in | Wt 120.0 lb

## 2020-06-12 DIAGNOSIS — R8290 Unspecified abnormal findings in urine: Secondary | ICD-10-CM

## 2020-06-12 DIAGNOSIS — N952 Postmenopausal atrophic vaginitis: Secondary | ICD-10-CM | POA: Diagnosis not present

## 2020-06-12 MED ORDER — NITROFURANTOIN MACROCRYSTAL 50 MG PO CAPS: 50.0000 mg | ORAL_CAPSULE | ORAL | 2 refills | Status: DC | PRN

## 2020-06-12 NOTE — Progress Notes (Signed)
GYNECOLOGY  VISIT   HPI: 49 y.o.   Married  [place] American  female   [phone] with Patient's last menstrual period was 08/07/2015.   here for burning at the end of urinating. Patient states some better this week. She was seen by Urgent care and PCP. Treated with Cipro and Diflucan. Cipro didn't help with burning so PCP switched to Keflex. She is still not sure completely better. Never received results from Urgent care on urine culture. UC results not available in Epic.  Today she is not having burning with urination, but she is not completely comfortable. No bleed in urine or foul smelling urine.  She had some cloudy urine versus vaginal drainage.  Some lower midline back pain.  No nausea, vomiting or fever.   No vaginal itching or irritation today.   She thinks there is a relationship with her symptoms with intercourse. Not usually having dryness with intercourse.   She is somewhat stressed and so she is having an oral HSV outbreak and taking antiviral medication for this.  Denies hot flashes or night sweats.   Urine dip:  Negative.   GYNECOLOGIC HISTORY: Patient's last menstrual period was 08/07/2015. Contraception:  Hyst Menopausal hormone therapy:  none Last mammogram: 12-21-19 3D/Rt.Br.poss.mass;Lt.Br.Neg--Rt.Diag.w/US/density C/Rt.breast cyst/Neg/screening 34yrBiRads2 Last pap smear:   03-31-15 Neg:Neg HR HPV, 2014 Neg        OB History    Gravida  5   Para  3   Term  3   Preterm      AB  2   Living  3     SAB  1   TAB  1   Ectopic      Multiple      Live Births                 Patient Active Problem List   Diagnosis Date Noted   Crohn disease ([place] 02/18/2017   History of uveitis 02/18/2017   Ankylosing spondylitis of multiple sites in spine ([place] 01/29/2017   Encounter for long-term (current) use of high-risk medication 01/29/2017   Abnormal loss of weight 11/02/2016   Crohn's disease of both small and large intestine with complication  ([place] 006/[id number]  Elevated ferritin level 11/01/2016   History of colitis 03/29/2016   Elevated serum globulin level 12/29/2015   Depression 09/15/2015   Vitamin D deficiency 09/08/2015   Anemia 09/08/2015   Status post laparoscopic hysterectomy 08/21/2015   Low calcium levels 07/14/2015   Abnormal CT of the abdomen 04/24/2015   Hematochezia 04/24/2015   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/12/2014   Extrinsic asthma 07/12/2014   Iron deficiency anemia 07/11/2014   HLA B27 positive 07/06/2014   Low serum vitamin D    Non-celiac gluten sensitivity    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[facility]     Past Medical History:  Diagnosis Date   Abnormal CT of the abdomen    Abnormal uterine bleeding    Anemia    iron def   Ankylosing spondylitis (HCC)    Chicken pox    Colitis    Crohn disease ([facility]    Dysrhythmia    occassional fluttering noted by patient   Genital warts    Gluten intolerance    History of chlamydia    History of hysterectomy 08/21/15   HLA B27 (HLA B27 positive)    HSV-1 (herpes simplex virus 1) infection    Low calcium levels    Low iron    Low serum vitamin D  Muscle spasms of both lower extremities    Spasms in lower back   Non-celiac gluten sensitivity    Reactive airway disease    Spondyloarthritis    genetic marker   UTI (lower urinary tract infection)     Past Surgical History:  Procedure Laterality Date   ABDOMINAL HYSTERECTOMY  08/21/15   BILATERAL SALPINGECTOMY Bilateral 08/21/2015   Procedure: BILATERAL SALPINGECTOMY;  Surgeon: Nunzio Cobbs, MD;  Location: [facility];  Service: Gynecology;  Laterality: Bilateral;   COLONOSCOPY     CYSTOSCOPY N/A 08/21/2015   Procedure: CYSTOSCOPY;  Surgeon: Nunzio Cobbs, MD;  Location: [facility];  Service: Gynecology;  Laterality: N/A;   MOUTH SURGERY     cyst in mouth   ROBOTIC ASSISTED TOTAL HYSTERECTOMY WITH SALPINGECTOMY Bilateral 08/21/2015    Procedure: ROBOTIC ASSISTED TOTAL HYSTERECTOMY ;  Surgeon: Nunzio Cobbs, MD;  Location: [facility];  Service: Gynecology;  Laterality: Bilateral;   WISDOM TOOTH EXTRACTION      Current Outpatient Medications  Medication Sig Dispense Refill   Adalimumab (HUMIRA PEN) 40 MG/0.8ML PNKT Inject into the skin.     albuterol (PROVENTIL HFA;VENTOLIN HFA) 108 (90 Base) MCG/ACT inhaler Inhale 2 puffs into the lungs every 6 (six) hours as needed for wheezing or shortness of breath. 1 Inhaler 0   calcium gluconate 500 MG tablet Take 1 tablet by mouth 3 (three) times daily.     cephALEXin (KEFLEX) 500 MG capsule Take 1 capsule (500 mg total) by mouth 3 (three) times daily. 15 capsule 0   fluconazole (DIFLUCAN) 150 MG tablet Take one tablet now PO, may repeat in 72 hours if symptoms still present. 2 tablet 0   folic acid (FOLVITE) 1 MG tablet Take 1 tablet by mouth daily.     methotrexate (RHEUMATREX) 2.5 MG tablet Take 2.5 mg by mouth once a week. Caution:Chemotherapy. Protect from light.     nystatin-triamcinolone (MYCOLOG II) cream Apply 1 application topically 2 (two) times daily. Apply to affected area BID for up to 7 days. 60 g 0   valACYclovir (VALTREX) 500 MG tablet Take one tab (500 mg) PO bid x 3 days prn for genital outbreak.  Take 4 tabs (2000 mg) orally every 12 hours for 24 hours for oral outbreak. 30 tablet 2   cyanocobalamin 100 MCG tablet Take 1 tablet by mouth daily.     No current facility-administered medications for this visit.     ALLERGIES: Gluten meal, Tramadol, Codeine, and Peanut-containing drug products  Family History  Problem Relation Age of Onset   Hypertension Mother    Diabetes Mother        ? father   Anxiety disorder Mother    Cancer Paternal Grandmother        ? type   Diabetes Father    Colon polyps Father    Prostate cancer Father     Social History   Socioeconomic History   Marital status: Married    Spouse name: Not on file    Number of children: Not on file   Years of education: Not on file   Highest education level: Not on file  Occupational History   Not on file  Tobacco Use   Smoking status: Never Smoker   Smokeless tobacco: Never Used  Vaping Use   Vaping Use: Never used  Substance and Sexual Activity   Alcohol use: Yes    Alcohol/week: 4.0 - 5.0 standard drinks    Types:  4 - 5 Glasses of wine per week   Drug use: No   Sexual activity: Yes    Partners: Male    Comment: Hyst  Other Topics Concern   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 [name]. [name].    Got 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      Hobbies: rest, reading, cooking, acting, piano, kids   Social Determinants of Health   Financial Resource Strain:    Difficulty of Paying Living Expenses: Not on file  Food Insecurity:    Worried About Charity fundraiser in the Last Year: Not on file   [employer] of Food in the Last Year: Not on file  Transportation Needs:    Lack of Transportation (Medical): Not on file   Lack of Transportation (Non-Medical): Not on file  Physical Activity:    Days of Exercise per Week: Not on file   Minutes of Exercise per Session: Not on file  Stress:    Feeling of Stress : Not on file  Social Connections:    Frequency of Communication with Friends and Family: Not on file   Frequency of Social Gatherings with Friends and Family: Not on file   Attends Religious Services: Not on file   Active Member of Clubs or Organizations: Not on file   Attends Archivist Meetings: Not on file   Marital Status: Not on file  Intimate Partner Violence:    Fear of Current or Ex-Partner: Not on file   Emotionally Abused: Not on file   Physically Abused: Not on file   Sexually Abused: Not on file    Review of Systems   Genitourinary:       Burning after urinating  All other systems reviewed and are negative.   PHYSICAL EXAMINATION:    BP 112/66    Pulse 76    Ht _0  (1.6 m)    Wt 120 lb (54.4 kg)    LMP 08/07/2015    BMI 21.26 kg/m     General appearance: alert, cooperative and appears stated age   Pelvic: External genitalia:  no lesions              Urethra:  normal appearing urethra with no masses, tenderness or lesions              Bartholins and Skenes: normal                 Vagina: normal appearing vagina with normal color and discharge, no lesions              Cervix: absent Bimanual Exam:  Uterus:  absent              Adnexa: no mass, fullness, tenderness           Chaperone was present for exam.  ASSESSMENT  Post coital UTI.  Cloudy urine.  Oral HSV outbreaks.  Stress.   PLAN  Nitrofurantoin 50 mg post coital.  #30, RF 2.   She will start Nitrofurantoin after she completes her current Keflex.  May consider local vaginal estrogen therapy.  Continue Valtrex prophylaxis.

## 2020-06-14 DIAGNOSIS — E559 Vitamin D deficiency, unspecified: Secondary | ICD-10-CM | POA: Diagnosis not present

## 2020-06-14 DIAGNOSIS — Z79899 Other long term (current) drug therapy: Secondary | ICD-10-CM | POA: Diagnosis not present

## 2020-06-14 DIAGNOSIS — M450 Ankylosing spondylitis of multiple sites in spine: Secondary | ICD-10-CM | POA: Diagnosis not present

## 2020-06-14 LAB — POCT URINALYSIS DIPSTICK
Bilirubin, UA: NEGATIVE
Blood, UA: NEGATIVE
Glucose, UA: NEGATIVE
Ketones, UA: NEGATIVE
Leukocytes, UA: NEGATIVE
Nitrite, UA: NEGATIVE
Protein, UA: NEGATIVE
Urobilinogen, UA: 0.2 E.U./dL
pH, UA: 5 (ref 5.0–8.0)

## 2020-06-16 ENCOUNTER — Encounter: Payer: Self-pay | Admitting: Family

## 2020-07-21 ENCOUNTER — Ambulatory Visit: Payer: Federal, State, Local not specified - PPO | Admitting: Podiatry

## 2020-08-02 ENCOUNTER — Other Ambulatory Visit: Payer: Self-pay | Admitting: Family

## 2020-08-08 ENCOUNTER — Ambulatory Visit: Payer: Federal, State, Local not specified - PPO | Admitting: Podiatry

## 2020-08-10 ENCOUNTER — Ambulatory Visit: Payer: Federal, State, Local not specified - PPO | Admitting: Podiatry

## 2020-08-10 ENCOUNTER — Other Ambulatory Visit: Payer: Self-pay

## 2020-08-10 ENCOUNTER — Ambulatory Visit (INDEPENDENT_AMBULATORY_CARE_PROVIDER_SITE_OTHER): Payer: Federal, State, Local not specified - PPO

## 2020-08-10 DIAGNOSIS — M722 Plantar fascial fibromatosis: Secondary | ICD-10-CM

## 2020-08-10 DIAGNOSIS — G8929 Other chronic pain: Secondary | ICD-10-CM | POA: Diagnosis not present

## 2020-08-10 DIAGNOSIS — M216X9 Other acquired deformities of unspecified foot: Secondary | ICD-10-CM

## 2020-08-10 DIAGNOSIS — M79672 Pain in left foot: Secondary | ICD-10-CM

## 2020-08-23 NOTE — Progress Notes (Signed)
  Subjective:  Patient ID: Janice Zuniga, female    DOB: 08/02/1971,  MRN: [id number]  Chief Complaint  Patient presents with  . Foot Pain    left foot pain. Top of foot and on the bottom of the foot. Pain is constant but some days are worse than others     49 y.o. female presents with the above complaint. History confirmed with patient.   Objective:  Physical Exam: warm, good capillary refill, no trophic changes or ulcerative lesions, normal DP and PT pulses and normal sensory exam. Left Foot: tenderness to palpation medial calcaneal tuber, no pain with calcaneal squeeze, decreased ankle joint ROM and +Silverskiold test  Radiographs: X-ray of the left foot: no fracture, dislocation, swelling or degenerative changes noted and no evidence of calcaneal stress fracture  Assessment:   1. Plantar fasciitis   2. Equinus deformity of foot   3. Heel pain, chronic, left      Plan:  Patient was evaluated and treated and all questions answered.  Plantar Fasciitis -XR reviewed with patient -Educated patient on stretching and icing of the affected limb -Plantar fascial brace dispensed  No follow-ups on file.

## 2020-09-08 ENCOUNTER — Encounter: Payer: Self-pay | Admitting: Family

## 2020-09-08 ENCOUNTER — Other Ambulatory Visit: Payer: Self-pay

## 2020-09-08 ENCOUNTER — Ambulatory Visit (INDEPENDENT_AMBULATORY_CARE_PROVIDER_SITE_OTHER): Payer: Federal, State, Local not specified - PPO | Admitting: Family

## 2020-09-08 VITALS — BP 109/70 | HR 79 | Temp 98.5°F | Ht 63.0 in | Wt 124.0 lb

## 2020-09-08 DIAGNOSIS — Z23 Encounter for immunization: Secondary | ICD-10-CM

## 2020-09-08 DIAGNOSIS — Z0000 Encounter for general adult medical examination without abnormal findings: Secondary | ICD-10-CM | POA: Diagnosis not present

## 2020-09-08 NOTE — Patient Instructions (Addendum)
Please schedule a general dental visit.   Continue healthy diet and regular exercise.

## 2020-09-08 NOTE — Progress Notes (Signed)
Subjective:    Patient ID: Janice Zuniga, female    DOB: Dec 05, 1970, 48 y.o.   MRN: [id number]  HPI  Patient is 48 yr old female who presents today for cpx.  Immunizations: pfvzer x 3, tdap 2021, flu shot today Diet: healthy Exercise:  Improving, started doing some weights, treadmill 3+ times a week Wt Readings from Last 3 Encounters:  09/08/20 124 lb (56.2 kg)  06/12/20 120 lb (54.4 kg)  06/07/20 122 lb (55.3 kg)  Colonoscopy: 2017- sees GI [facility]- Dr. Benjiman Core Dexa: don 02/16/19 Pap Smear: hysterectomy Mammogram: 3/21 Vision: 2 years ago- scheduled Dental: due      Review of Systems  HENT: Negative for hearing loss and rhinorrhea.   Eyes: Positive for visual disturbance (needs reading glasses).  Respiratory: Negative for shortness of breath.   Cardiovascular: Negative for chest pain.  Gastrointestinal: Negative for blood in stool, constipation and diarrhea.  Genitourinary: Negative for dysuria and frequency.  Musculoskeletal: Negative for arthralgias and joint swelling.  Skin: Negative for rash.  Neurological: Negative for headaches.  Hematological: Negative for adenopathy.  Psychiatric/Behavioral:       Denies depression/anxiety       Past Medical History:  Diagnosis Date  . Abnormal CT of the abdomen   . Abnormal uterine bleeding   . Anemia    iron def  . Ankylosing spondylitis ([place])   . Chicken pox   . Colitis   . Crohn disease ([place])   . Dysrhythmia    occassional fluttering noted by patient  . Genital warts   . Gluten intolerance   . History of chlamydia   . History of hysterectomy 08/21/15  . HLA B27 (HLA B27 positive)   . HSV-1 (herpes simplex virus 1) infection   . Low calcium levels   . Low iron   . Low serum vitamin D   . Muscle spasms of both lower extremities    Spasms in lower back  . Non-celiac gluten sensitivity   . Reactive airway disease   . Spondyloarthritis    genetic marker  . UTI (lower urinary tract infection)       Social History   Socioeconomic History  . Marital status: Married    Spouse name: Not on file  . Number of children: Not on file  . Years of education: Not on file  . Highest education level: Not on file  Occupational History  . Not on file  Tobacco Use  . Smoking status: Never Smoker  . Smokeless tobacco: Never Used  Vaping Use  . Vaping Use: Never used  Substance and Sexual Activity  . Alcohol use: Yes    Alcohol/week: 4.0 - 5.0 standard drinks    Types: 4 - 5 Glasses of wine per week  . Drug use: No  . Sexual activity: Yes    Partners: Male    Comment: Hyst  Other Topics Concern  . 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 [name]. [name].    Got 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 ABDOMINAL HYSTERECTOMY  08/21/15  . BILATERAL SALPINGECTOMY Bilateral 08/21/2015   Procedure: BILATERAL SALPINGECTOMY;  Surgeon: Nunzio Cobbs, MD;  Location: [facility];  Service: Gynecology;  Laterality: Bilateral;  . COLONOSCOPY    . CYSTOSCOPY N/A 08/21/2015   Procedure: CYSTOSCOPY;  Surgeon: Nunzio Cobbs, MD;  Location: [facility];  Service: Gynecology;  Laterality: N/A;  . MOUTH SURGERY     cyst in mouth  . ROBOTIC ASSISTED TOTAL HYSTERECTOMY WITH SALPINGECTOMY Bilateral 08/21/2015   Procedure: ROBOTIC ASSISTED TOTAL HYSTERECTOMY ;  Surgeon: Nunzio Cobbs, MD;  Location: [facility];  Service: Gynecology;  Laterality: Bilateral;   . WISDOM TOOTH EXTRACTION      Family History  Problem Relation Age of Onset  . Hypertension Mother   . Diabetes Mother        ? father  . Anxiety disorder Mother   . Cancer Paternal Grandmother        ? type  . Diabetes Father   . Colon polyps Father   . Prostate cancer Father     Allergies  Allergen Reactions  . Gluten Meal Other (See Comments)    Auto immune  . Tramadol     nausea  . Codeine Nausea And Vomiting  . Peanut-Containing Drug Products Other (See Comments)    By allergy test...has eaten peanuts without issues     Current Outpatient Medications on File Prior to Visit  Medication Sig Dispense Refill  . Adalimumab (HUMIRA PEN) 40 MG/0.8ML PNKT Inject into the skin.    [place] Kitchen albuterol (PROVENTIL HFA;VENTOLIN HFA) 108 (90 Base) MCG/ACT inhaler Inhale 2 puffs into the lungs every 6 (six) hours as needed for wheezing or shortness of breath. 1 Inhaler 0  . cyanocobalamin 100 MCG tablet Take 100 mcg by mouth daily.    . folic acid (FOLVITE) 1 MG tablet Take 1 tablet by mouth daily.    . methotrexate (RHEUMATREX) 2.5 MG tablet Take 2.5 mg by mouth once a week. Caution:Chemotherapy. Protect from light.    . nitrofurantoin (MACRODANTIN) 50 MG capsule Take 1 capsule (50 mg total) by mouth as needed. Take post coital for infection prevention. 30 capsule 2  . valACYclovir (VALTREX) 500 MG tablet Take one tab (500 mg) PO bid x 3 days prn for genital outbreak.  Take 4 tabs (2000 mg) orally every 12 hours for 24 hours for oral outbreak. 30 tablet 2   No current facility-administered medications on file prior to visit.    BP 109/70 (BP Location: Right Arm, Patient Position: Sitting, Cuff Size: Large)   Pulse 79   Temp 98.5 F (36.9 C) (Oral)   Ht 5' 3"  (1.6 m)   Wt 124 lb (56.2 kg)   LMP 08/07/2015   SpO2 100%   BMI 21.97 kg/m    Objective:   Physical Exam  Physical Exam  Constitutional: She is oriented to person, place, and time. She appears well-developed and  well-nourished. No distress.  HENT:  Head: Normocephalic and atraumatic.  Right Ear: Tympanic membrane and ear canal normal.  Left Ear: Tympanic membrane and ear canal normal.  Mouth/Throat: Not examined- pt wearing mask Eyes: Pupils are equal, round, and reactive to light. No scleral icterus.  Neck: Normal range of motion. No thyromegaly present.  Cardiovascular: Normal rate and regular rhythm.  No murmur heard. Pulmonary/Chest: Effort normal and breath sounds normal. No respiratory distress. He has no wheezes. She has no rales. She exhibits no tenderness.  Abdominal: Soft. Bowel sounds are normal. She exhibits no distension and no mass. There is no tenderness. There is no rebound and no guarding.  Musculoskeletal: She exhibits no edema.  Lymphadenopathy:    She has no cervical adenopathy.  Neurological: She is alert and oriented to person, place, and time. She has normal patellar reflexes. She exhibits normal muscle tone. Coordination normal.  Skin: Skin is warm and dry.  Psychiatric: She has a normal mood and affect. Her behavior is normal. Judgment and thought content normal.      Assessment & Plan:   Preventative care- encouraged pt to continue healthy diet and regular exercise. Reviewed labs in care everywhere from [facility]  Not due for any follow up labs.  mammo up to date.  She may be due for a follow up colonoscopy- she will call her GI dr for follow up.    This visit occurred during the SARS-CoV-2 public health emergency.  Safety protocols were in place, including screening questions prior to the visit, additional usage of staff PPE, and extensive cleaning of exam room while observing appropriate contact time as indicated for disinfecting solutions.        Assessment & Plan:

## 2020-09-18 ENCOUNTER — Other Ambulatory Visit: Payer: Federal, State, Local not specified - PPO

## 2020-09-18 DIAGNOSIS — Z20822 Contact with and (suspected) exposure to covid-19: Secondary | ICD-10-CM | POA: Diagnosis not present

## 2020-09-19 LAB — NOVEL CORONAVIRUS, NAA: SARS-CoV-2, NAA: NOT DETECTED

## 2020-09-19 LAB — SARS-COV-2, NAA 2 DAY TAT

## 2020-09-20 ENCOUNTER — Encounter: Payer: Self-pay | Admitting: Family

## 2020-09-21 DIAGNOSIS — U071 COVID-19: Secondary | ICD-10-CM | POA: Diagnosis not present

## 2020-09-26 ENCOUNTER — Other Ambulatory Visit: Payer: Self-pay

## 2020-09-26 ENCOUNTER — Ambulatory Visit: Payer: Federal, State, Local not specified - PPO | Admitting: Podiatry

## 2020-09-26 DIAGNOSIS — M79672 Pain in left foot: Secondary | ICD-10-CM

## 2020-09-26 DIAGNOSIS — G8929 Other chronic pain: Secondary | ICD-10-CM

## 2020-09-26 DIAGNOSIS — M216X9 Other acquired deformities of unspecified foot: Secondary | ICD-10-CM | POA: Diagnosis not present

## 2020-09-26 DIAGNOSIS — M722 Plantar fascial fibromatosis: Secondary | ICD-10-CM

## 2020-09-26 NOTE — Progress Notes (Signed)
  Subjective:  Patient ID: Janice Zuniga, female    DOB: [birth year],  MRN: [id number]  Chief Complaint  Patient presents with  . Plantar Fasciitis    Pt states interest in orthotics as the plantar fascial brace keeps coming off and is uncomfortable. Pt still experiencing plantar heel pain.    50 y.o. female presents with the above complaint. History confirmed with patient.   Objective:  Physical Exam: warm, good capillary refill, no trophic changes or ulcerative lesions, normal DP and PT pulses and normal sensory exam. Left Foot: no tenderness to palpation medial calcaneal tuber, no pain with calcaneal squeeze, decreased ankle joint ROM and +Silverskiold test  Assessment:   1. Plantar fasciitis   2. Equinus deformity of foot   3. Heel pain, chronic, left      Plan:  Patient was evaluated and treated and all questions answered.  Plantar Fasciitis -Much improved. Pain only intermittent. Dispense powerstep inserts. Discussed optimal shoegear for plantar fasciitis and slippers for home use. Recc Vionic shoes. F/u should issues persist.  Return if symptoms worsen or fail to improve.

## 2020-09-29 ENCOUNTER — Other Ambulatory Visit: Payer: Self-pay

## 2020-09-29 ENCOUNTER — Telehealth (INDEPENDENT_AMBULATORY_CARE_PROVIDER_SITE_OTHER): Payer: Federal, State, Local not specified - PPO | Admitting: Family

## 2020-09-29 DIAGNOSIS — B349 Viral infection, unspecified: Secondary | ICD-10-CM

## 2020-09-29 MED ORDER — BENZONATATE 100 MG PO CAPS: 100.0000 mg | ORAL_CAPSULE | Freq: Three times a day (TID) | ORAL | 0 refills | Status: DC | PRN

## 2020-09-29 NOTE — Progress Notes (Signed)
Virtual Visit via Video Note  I connected with Janice Zuniga on 09/29/20 at  9:40 AM ESTverified that I am speaking with the correct person using two identifiers.  We attempted to connect via video, however she had technical issues with her iPhone and we had to transition to a telephone visit.  Location: Patient: home Provider: work   I discussed the limitations of evaluation and management by telemedicine and the availability of in person appointments. The patient expressed understanding and agreed to proceed. Only the patient and myself were present for today's video call.   History of Present Illness:  Symptoms began 12/25.  "felt off."  Pushed through.  Had mild congestion, fatigue, sore throat.  Her daughter started feeling sick as well. She was tested 12/27 and tested negative (daughter was covid positive).  Pt reports that symptoms worsened (fever/cough/fever/nausea) on the 28th and 29th. Son developed symptoms and they both retested on 12/30.  There was an issue with her birthdate on the specimen). Son tested negative.   Today she reports temporal HA (worsened by cough).  At night and throughout the day she has had "the most excruciating headache."  She has nasal congestion, chest congestion (mildly yellow today). Cough is staying the same.  Cough is intermittent and generally dry.  She denies recent fever.  Denies sinus tenderness to palpation currently. Had some tenderness to palpation last week. She denies SOB.  + fatigue.     Observations/Objective:   Gen: Awake, alert, no acute distress Resp: Breathing is even and non-labored, intermittent dry sounding cough noted Psych: calm/pleasant demeanor Neuro: Alert and Oriented x 3, + facial symmetry, speech is clear.   Assessment and Plan:  Viral illness-I suspect that she did have COVID-19 despite her initial negative test.  She has tried to get her results from the second test, but has been unsuccessful in getting in touch with  [facility].  At this point I do not think there is a lot of value in her retesting.  She is outside the window for quarantine.  Her overall symptoms seem to be improving.  Recommended Tessalon as needed for cough 100 mg.,  Mucinex as needed for congestion, and Tylenol as needed for headache.  She is advised to call if symptoms worsen, if symptoms do not continue to improve, or if she develops shortness of breath or fever.  Patient verbalizes understanding.  15 minutes spent on today's phone visit. Follow Up Instructions:    I discussed the assessment and treatment plan with the patient. The patient was provided an opportunity to ask questions and all were answered. The patient agreed with the plan and demonstrated an understanding of the instructions.   The patient was advised to call back or seek an in-person evaluation if the symptoms worsen or if the condition fails to improve as anticipated.  Nance Pear, NP

## 2020-10-23 ENCOUNTER — Ambulatory Visit: Payer: Federal, State, Local not specified - PPO | Admitting: Obstetrics and Gynecology

## 2020-10-23 ENCOUNTER — Encounter: Payer: Self-pay | Admitting: Obstetrics and Gynecology

## 2020-10-23 ENCOUNTER — Other Ambulatory Visit: Payer: Self-pay

## 2020-10-23 VITALS — BP 120/76 | HR 83 | Ht 63.0 in | Wt 124.0 lb

## 2020-10-23 DIAGNOSIS — R300 Dysuria: Secondary | ICD-10-CM

## 2020-10-23 DIAGNOSIS — N952 Postmenopausal atrophic vaginitis: Secondary | ICD-10-CM

## 2020-10-23 DIAGNOSIS — N898 Other specified noninflammatory disorders of vagina: Secondary | ICD-10-CM | POA: Diagnosis not present

## 2020-10-23 LAB — URINALYSIS, COMPLETE W/RFL CULTURE
Bacteria, UA: NONE SEEN /HPF
Bilirubin Urine: NEGATIVE
Glucose, UA: NEGATIVE
Hgb urine dipstick: NEGATIVE
Hyaline Cast: NONE SEEN /LPF
Ketones, ur: NEGATIVE
Leukocyte Esterase: NEGATIVE
Nitrites, Initial: NEGATIVE
Protein, ur: NEGATIVE
RBC / HPF: NONE SEEN /HPF (ref 0–2)
Specific Gravity, Urine: 1.02 (ref 1.001–1.03)
pH: 5 (ref 5.0–8.0)

## 2020-10-23 LAB — WET PREP FOR TRICH, YEAST, CLUE

## 2020-10-23 MED ORDER — VALACYCLOVIR HCL 500 MG PO TABS: ORAL_TABLET | ORAL | 2 refills | Status: DC

## 2020-10-23 MED ORDER — ESTRADIOL 0.1 MG/GM VA CREA: TOPICAL_CREAM | VAGINAL | 1 refills | Status: DC

## 2020-10-23 NOTE — Progress Notes (Signed)
GYNECOLOGY  VISIT   HPI: 50 y.o.   Married  [place] American  female   [phone] with Patient's last menstrual period was 08/07/2015.   here for vaginal irritation and burning with urination. Symptoms started last week.   Patient takes Nitrofurantoin 50 mg postcoital but increased to 2 bid on Friday night.  Tried some vaginal boric acid.  Trying to push fluids with water.   Feeling tired and slight malaise.   No blood in urine.  No back pain.  No nausea or vomiting.   Interested in vaginal estrogen.   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  Is now on a lower dosage of MTX.  Buying a home.    GYNECOLOGIC HISTORY: Patient's last menstrual period was 08/07/2015. Contraception:  Hyst Menopausal hormone therapy:  none Last mammogram:  12-21-19 3D/Rt.Br.poss.mass;Lt.Br.Neg--Rt.Diag.w/US/density C/Rt.breast cyst/Neg/screening 28yrBiRads2 Last pap smear: 03-31-15 Neg:Neg HR HPV, 2014 Neg         OB History    Gravida  5   Para  3   Term  3   Preterm      AB  2   Living  3     SAB  1   IAB  1   Ectopic      Multiple      Live Births                 Patient Active Problem List   Diagnosis Date Noted  . Crohn disease ([facility] 02/18/2017  . History of uveitis 02/18/2017  . Ankylosing spondylitis of multiple sites in spine ([place] 01/29/2017  . Encounter for long-term (current) use of high-risk medication 01/29/2017  . Abnormal loss of weight 11/02/2016  . Crohn's disease of both small and large intestine with complication ([place] [id number] . Elevated ferritin level 11/01/2016  . History of colitis 03/29/2016  . Elevated serum globulin level 12/29/2015  . Depression 09/15/2015  . Vitamin D deficiency 09/08/2015  . Anemia 09/08/2015  . Status post laparoscopic hysterectomy 08/21/2015  . Low calcium levels 07/14/2015  . Abnormal CT of the abdomen 04/24/2015  . Hematochezia 04/24/2015  . 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 Extrinsic asthma 07/12/2014   . Iron deficiency anemia 07/11/2014  . HLA B27 positive 07/06/2014  . Low serum vitamin D   . Non-celiac gluten sensitivity   . 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 Abnormal CT of the abdomen   . Abnormal uterine bleeding   . Anemia    iron def  . Ankylosing spondylitis ([place]   . Chicken pox   . Colitis   . Crohn disease ([place]   . Dysrhythmia    occassional fluttering noted by patient  . Genital warts   . Gluten intolerance   . History of chlamydia   . History of hysterectomy 08/21/15  . HLA B27 (HLA B27 positive)   . HSV-1 (herpes simplex virus 1) infection   . Low calcium levels   . Low iron   . Low serum vitamin D   . Muscle spasms of both lower extremities    Spasms in lower back  . Non-celiac gluten sensitivity   . Reactive airway disease   . Spondyloarthritis    genetic marker  . UTI (lower urinary tract infection)     Past Surgical History:  Procedure Laterality Date  . ABDOMINAL HYSTERECTOMY  08/21/15  . BILATERAL SALPINGECTOMY Bilateral 08/21/2015   Procedure: BILATERAL SALPINGECTOMY;  Surgeon: BEverardo All  Yisroel Ramming, MD;  Location: [facility];  Service: Gynecology;  Laterality: Bilateral;  . COLONOSCOPY    . CYSTOSCOPY N/A 08/21/2015   Procedure: CYSTOSCOPY;  Surgeon: Nunzio Cobbs, MD;  Location: [facility];  Service: Gynecology;  Laterality: N/A;  . MOUTH SURGERY     cyst in mouth  . ROBOTIC ASSISTED TOTAL HYSTERECTOMY WITH SALPINGECTOMY Bilateral 08/21/2015   Procedure: ROBOTIC ASSISTED TOTAL HYSTERECTOMY ;  Surgeon: Nunzio Cobbs, MD;  Location: [facility];  Service: Gynecology;  Laterality: Bilateral;  . WISDOM TOOTH EXTRACTION      Current Outpatient Medications  Medication Sig Dispense Refill  . Adalimumab (HUMIRA PEN) 40 MG/0.8ML PNKT Inject into the skin.    Marland Kitchen albuterol (PROVENTIL HFA;VENTOLIN HFA) 108 (90 Base) MCG/ACT inhaler Inhale 2 puffs into the lungs every 6 (six) hours as needed for  wheezing or shortness of breath. 1 Inhaler 0  . benzonatate (TESSALON) 100 MG capsule Take 1 capsule (100 mg total) by mouth 3 (three) times daily as needed. 20 capsule 0  . folic acid (FOLVITE) 1 MG tablet Take 1 tablet by mouth daily.    . methotrexate (RHEUMATREX) 2.5 MG tablet Take 2.5 mg by mouth once a week. Caution:Chemotherapy. Protect from light.    . nitrofurantoin (MACRODANTIN) 50 MG capsule Take 50 mg by mouth daily.    . valACYclovir (VALTREX) 500 MG tablet Take one tab (500 mg) PO bid x 3 days prn for genital outbreak.  Take 4 tabs (2000 mg) orally every 12 hours for 24 hours for oral outbreak. 30 tablet 2   No current facility-administered medications for this visit.     ALLERGIES: Gluten meal, Tramadol, Codeine, and Peanut-containing drug products  Family History  Problem Relation Age of Onset  . Hypertension Mother   . Diabetes Mother        ? father  . Anxiety disorder Mother   . Cancer Paternal Grandmother        ? type  . Diabetes Father   . Colon polyps Father   . Prostate cancer Father     Social History   Socioeconomic History  . Marital status: Married    Spouse name: Not on file  . Number of children: Not on file  . Years of education: Not on file  . Highest education level: Not on file  Occupational History  . Not on file  Tobacco Use  . Smoking status: Never Smoker  . Smokeless tobacco: Never Used  Vaping Use  . Vaping Use: Never used  Substance and Sexual Activity  . Alcohol use: Yes    Alcohol/week: 4.0 - 5.0 standard drinks    Types: 4 - 5 Glasses of wine per week  . Drug use: No  . Sexual activity: Yes    Partners: Male    Comment: Hyst  Other Topics Concern  . 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 [name]. [name].    Got 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  All other systems reviewed and are negative.   PHYSICAL EXAMINATION:    BP 120/76   Pulse 83   Ht 5' 3"  (1.6 m)   Wt 124 lb (56.2 kg)   LMP 08/07/2015   SpO2 99%   BMI 21.97 kg/m     General appearance: alert, cooperative and appears stated age  Pelvic: External genitalia:  no lesions              Urethra:  normal appearing urethra with no masses, tenderness or lesions              Bartholins and Skenes: normal                 Vagina: normal appearing vagina with normal color and discharge, no lesions              Cervix: absent                Bimanual Exam:  Uterus: absent              Adnexa: no mass, fullness, tenderness           Chaperone was present for exam.  ASSESSMENT  Vaginal irritation.  Vaginal atrophy.  Dysuria. On lower dosage of MTX.  Hx HSV.   PLAN  Urine micro and culture.  Micro negative so OK to stop Nitrofurantoin.  Will still sent culture.  Wet prep negative.  Vaginal atrophy discussed.  Start Estrace vaginal cream. Instructed in use.  Discussed potential effect on breast cancer.  Refill of Valtrex. Return for annual exam in February.  30 min  total time was spent for this patient encounter, including preparation, face-to-face counseling with the patient, coordination of care, and documentation of the encounter.

## 2020-10-25 ENCOUNTER — Telehealth (INDEPENDENT_AMBULATORY_CARE_PROVIDER_SITE_OTHER): Payer: Federal, State, Local not specified - PPO | Admitting: Family

## 2020-10-25 ENCOUNTER — Other Ambulatory Visit: Payer: Self-pay

## 2020-10-25 DIAGNOSIS — R059 Cough, unspecified: Secondary | ICD-10-CM

## 2020-10-25 MED ORDER — BENZONATATE 100 MG PO CAPS: 100.0000 mg | ORAL_CAPSULE | Freq: Three times a day (TID) | ORAL | 0 refills | Status: DC | PRN

## 2020-10-25 NOTE — Progress Notes (Signed)
Virtual Visit via Telephone Note  I connected with Janice Zuniga on 10/25/20 at 12:20 PM EST by telephone and verified that I am speaking with the correct person using two identifiers.  Location: Patient: work Provider: home   I discussed the limitations, risks, security and privacy concerns of performing an evaluation and management service by telephone and the availability of in person appointments. I also discussed with the patient that there may be a patient responsible charge related to this service. The patient expressed understanding and agreed to proceed.   History of Present Illness:  Took covid test yesterday- negative.  Last week she has been coughing. Worse at night. Restarted tessalon perles which is helping a lot. Overall she does not really feel sick and does not have any fever.  Mild nasal congestion.    Observations/Objective:  Gen: Awake, alert, no acute distress Resp: Breathing sounds even and non-labored Psych: calm/pleasant demeanor Neuro: Alert and Oriented x 3, speech is clear.   Assessment and Plan:  Cough- will order chest x-ray to rule out PNA.  Refill provided for tessalon 128m tabs. She is advised to call if symptoms worsen or if symptoms fail to improve.  Will hold off on abx pending review of chest x-ray.  Follow Up Instructions:    I discussed the assessment and treatment plan with the patient. The patient was provided an opportunity to ask questions and all were answered. The patient agreed with the plan and demonstrated an understanding of the instructions.   The patient was advised to call back or seek an in-person evaluation if the symptoms worsen or if the condition fails to improve as anticipated.  I provided 12 minutes of non-face-to-face time during this encounter.   MNance Pear NP

## 2020-11-06 DIAGNOSIS — M450 Ankylosing spondylitis of multiple sites in spine: Secondary | ICD-10-CM | POA: Diagnosis not present

## 2020-11-06 DIAGNOSIS — Z79899 Other long term (current) drug therapy: Secondary | ICD-10-CM | POA: Diagnosis not present

## 2020-11-06 DIAGNOSIS — K50819 Crohn's disease of both small and large intestine with unspecified complications: Secondary | ICD-10-CM | POA: Diagnosis not present

## 2020-11-06 DIAGNOSIS — Z1589 Genetic susceptibility to other disease: Secondary | ICD-10-CM | POA: Diagnosis not present

## 2020-11-08 ENCOUNTER — Ambulatory Visit: Payer: Federal, State, Local not specified - PPO | Admitting: Obstetrics and Gynecology

## 2020-11-24 ENCOUNTER — Ambulatory Visit: Payer: Federal, State, Local not specified - PPO | Admitting: Obstetrics and Gynecology

## 2021-01-03 ENCOUNTER — Other Ambulatory Visit (HOSPITAL_BASED_OUTPATIENT_CLINIC_OR_DEPARTMENT_OTHER): Payer: Self-pay | Admitting: Family

## 2021-01-03 DIAGNOSIS — Z1231 Encounter for screening mammogram for malignant neoplasm of breast: Secondary | ICD-10-CM

## 2021-01-04 ENCOUNTER — Telehealth: Payer: Self-pay | Admitting: Obstetrics and Gynecology

## 2021-01-04 ENCOUNTER — Encounter (HOSPITAL_BASED_OUTPATIENT_CLINIC_OR_DEPARTMENT_OTHER): Payer: Self-pay

## 2021-01-04 ENCOUNTER — Other Ambulatory Visit: Payer: Self-pay

## 2021-01-04 ENCOUNTER — Ambulatory Visit: Payer: Federal, State, Local not specified - PPO | Admitting: Obstetrics and Gynecology

## 2021-01-04 ENCOUNTER — Encounter: Payer: Self-pay | Admitting: Obstetrics and Gynecology

## 2021-01-04 ENCOUNTER — Ambulatory Visit (HOSPITAL_BASED_OUTPATIENT_CLINIC_OR_DEPARTMENT_OTHER)
Admission: RE | Admit: 2021-01-04 | Discharge: 2021-01-04 | Disposition: A | Discharge status: Home / Self Care | Payer: Federal, State, Local not specified - PPO | Source: Ambulatory Visit | Attending: Family | Admitting: Family

## 2021-01-04 VITALS — BP 108/64 | HR 66 | Ht 63.5 in | Wt 123.0 lb

## 2021-01-04 DIAGNOSIS — Z01419 Encounter for gynecological examination (general) (routine) without abnormal findings: Secondary | ICD-10-CM | POA: Diagnosis not present

## 2021-01-04 DIAGNOSIS — N6320 Unspecified lump in the left breast, unspecified quadrant: Secondary | ICD-10-CM

## 2021-01-04 DIAGNOSIS — Z1231 Encounter for screening mammogram for malignant neoplasm of breast: Secondary | ICD-10-CM | POA: Insufficient documentation

## 2021-01-04 MED ORDER — VALACYCLOVIR HCL 500 MG PO TABS: ORAL_TABLET | ORAL | 2 refills | Status: DC

## 2021-01-04 MED ORDER — ESTRADIOL 0.1 MG/GM VA CREA: TOPICAL_CREAM | VAGINAL | 0 refills | Status: DC

## 2021-01-04 NOTE — Telephone Encounter (Signed)
Patient needs a dx left mammogram and left breast ultrasound for a 2 cm mass at 2:00.   She just had her screening today at the [facility], and her mammogram is not finalized yet.  Her mammogram was a routine screening.

## 2021-01-04 NOTE — Patient Instructions (Signed)

## 2021-01-04 NOTE — Progress Notes (Signed)
50 y.o. [id number] Married [place] American female here for annual exam.    Did not start vaginal estrogen cream.   Doing well, and not having UTIs.  Taking Nitrofurantoin for prophylaxis.  Buying a home, closing in May.  Received her first Covid booster.   PCP: [name], NP     Patient's last menstrual period was 08/07/2015.           Sexually active: Yes.    The current method of family planning is status post hysterectomy.    Exercising: Yes.    treadmill 5days/week Smoker:  no  Health Maintenance: Pap: 03-31-15 Neg:Neg HR HPV; 2014 neg History of abnormal Pap:  no MMG: 01-04-21 pending Colonoscopy: 06-28-16 06-28-16 crohns colitis Dr.Armbruster--now seeing GI at [facility] BMD:   02-09-19  Result :at [facility] TDaP:  11-08-19 Gardasil:   no BUL:AGTXMI 2017 Neg Hep C: 2017 Neg Screening Labs:  PCP   reports that she has never smoked. She has never used smokeless tobacco. She reports current alcohol use of about 4.0 - 5.0 standard drinks of alcohol per week. She reports that she does not use drugs.  Past Medical History:  Diagnosis Date  . Abnormal CT of the abdomen   . Abnormal uterine bleeding   . Anemia    iron def  . Ankylosing spondylitis ([place])   . Chicken pox   . Colitis   . Crohn disease ([place])   . Dysrhythmia    occassional fluttering noted by patient  . Genital warts   . Gluten intolerance   . History of chlamydia   . History of hysterectomy 08/21/15  . HLA B27 (HLA B27 positive)   . HSV-1 (herpes simplex virus 1) infection   . Low calcium levels   . Low iron   . Low serum vitamin D   . Muscle spasms of both lower extremities    Spasms in lower back  . Non-celiac gluten sensitivity   . Reactive airway disease   . Spondyloarthritis    genetic marker  . UTI (lower urinary tract infection)     Past Surgical History:  Procedure Laterality Date  . ABDOMINAL HYSTERECTOMY  08/21/15  . BILATERAL SALPINGECTOMY Bilateral 08/21/2015   Procedure: BILATERAL  SALPINGECTOMY;  Surgeon: Nunzio Cobbs, MD;  Location: [facility];  Service: Gynecology;  Laterality: Bilateral;  . COLONOSCOPY    . CYSTOSCOPY N/A 08/21/2015   Procedure: CYSTOSCOPY;  Surgeon: Nunzio Cobbs, MD;  Location: [facility];  Service: Gynecology;  Laterality: N/A;  . MOUTH SURGERY     cyst in mouth  . ROBOTIC ASSISTED TOTAL HYSTERECTOMY WITH SALPINGECTOMY Bilateral 08/21/2015   Procedure: ROBOTIC ASSISTED TOTAL HYSTERECTOMY ;  Surgeon: Nunzio Cobbs, MD;  Location: [facility];  Service: Gynecology;  Laterality: Bilateral;  . WISDOM TOOTH EXTRACTION      Current Outpatient Medications  Medication Sig Dispense Refill  . estradiol (ESTRACE) 0.1 MG/GM vaginal cream Use 1/2 g vaginally every night for the first 2 weeks, then use 1/2 g vaginally two or three times per week as needed to maintain symptom relief. 68.0 g 1  . folic acid (FOLVITE) 1 MG tablet Take 1 tablet by mouth daily.    Marland Kitchen HUMIRA PEN 40 MG/0.4ML PNKT SMARTSIG:40 Milligram(s) SUB-Q Every 2 Weeks    . methotrexate (RHEUMATREX) 2.5 MG tablet Take 2.5 mg by mouth once a week. Caution:Chemotherapy. Protect from light.    . nitrofurantoin (MACRODANTIN) 50 MG capsule Take 50 mg by  mouth daily.    . valACYclovir (VALTREX) 500 MG tablet Take one tab (500 mg) PO bid x 3 days prn for genital outbreak.  Take 4 tabs (2000 mg) orally every 12 hours for 24 hours for oral outbreak. 30 tablet 2   No current facility-administered medications for this visit.    Family History  Problem Relation Age of Onset  . Hypertension Mother   . Diabetes Mother        ? father  . Anxiety disorder Mother   . Cancer Paternal Grandmother        ? type  . Diabetes Father   . Colon polyps Father   . Prostate cancer Father     Review of Systems  All other systems reviewed and are negative.   Exam:   BP 108/64   Pulse 66   Ht 5' 3.5" (1.613 m)   Wt 123 lb (55.8 kg)   LMP 08/07/2015   SpO2 100%   BMI 21.45 kg/m      General appearance: alert, cooperative and appears stated age Head: normocephalic, without obvious abnormality, atraumatic Neck: no adenopathy, supple, symmetrical, trachea midline and thyroid normal to inspection and palpation Lungs: clear to auscultation bilaterally Breasts: right - normal appearance, no masses or tenderness, No nipple retraction or dimpling, No nipple discharge or bleeding, No axillary adenopathy Left - normal appearance, 2 cm smooth mobile mass at 2:00.  No nipple retraction or dimpling, No nipple discharge or bleeding, No axillary adenopathy Heart: regular rate and rhythm Abdomen: soft, non-tender; no masses, no organomegaly Extremities: extremities normal, atraumatic, no cyanosis or edema Skin: skin color, texture, turgor normal. No rashes or lesions Lymph nodes: cervical, supraclavicular, and axillary nodes normal. Neurologic: grossly normal  Pelvic: External genitalia:  no lesions.  Split in skin of perineum (was constipated recently)              No abnormal inguinal nodes palpated.              Urethra:  normal appearing urethra with no masses, tenderness or lesions              Bartholins and Skenes: normal                 Vagina: normal appearing vagina with normal color and discharge, no lesions              Cervix: absent              Pap taken: No. Bimanual Exam:  Uterus:  absent              Adnexa: no mass, fullness, tenderness              Rectal exam: Yes.  .  Confirms.              Anus:  normal sphincter tone, no lesions  Chaperone was present for exam.  Assessment:   Well woman visit with normal exam. Status post robotic TLH, bilateral salpingectomy.  HSV.  Oral and genital. Crohn's. On Humira.  Left breast mass 2 cm at 2:00. Post coital UTIs.  Plan: Dx left mammogram and left breast US.  Self breast awareness reviewed. Pap and HR HPV as above. Guidelines for Calcium, Vitamin D, regular exercise program including cardiovascular and  weight bearing exercise. Refill of vaginal estrogen and valtrex.   I discussed potential effect of estrogen on breast cancer.  Has Rx for nitrofurantoin.  Follow up annually and prn.

## 2021-01-09 NOTE — Telephone Encounter (Signed)
Spoke with Alyson Locket at [facility]. Diagnostic MMG/breast US scheduled for 02-12-21 at 1400.   Call to patient. Patient advised of breast imaging date and time. Agreeable to appointment. Advised to contact [facility] directly for cancellations or if appointment needs to be rescheduled.   Encounter closed.

## 2021-02-12 ENCOUNTER — Encounter: Payer: Self-pay | Admitting: Family

## 2021-02-12 ENCOUNTER — Ambulatory Visit
Admission: RE | Admit: 2021-02-12 | Discharge: 2021-02-12 | Disposition: A | Discharge status: Home / Self Care | Payer: Federal, State, Local not specified - PPO | Source: Ambulatory Visit | Attending: Obstetrics and Gynecology | Admitting: Obstetrics and Gynecology

## 2021-02-12 ENCOUNTER — Other Ambulatory Visit: Payer: Self-pay

## 2021-02-12 ENCOUNTER — Ambulatory Visit: Payer: Federal, State, Local not specified - PPO | Admitting: Family

## 2021-02-12 VITALS — BP 113/75 | HR 63 | Temp 98.3°F | Resp 16 | Ht 63.5 in | Wt 124.0 lb

## 2021-02-12 DIAGNOSIS — N6320 Unspecified lump in the left breast, unspecified quadrant: Secondary | ICD-10-CM

## 2021-02-12 DIAGNOSIS — R5383 Other fatigue: Secondary | ICD-10-CM | POA: Diagnosis not present

## 2021-02-12 DIAGNOSIS — K5090 Crohn's disease, unspecified, without complications: Secondary | ICD-10-CM

## 2021-02-12 DIAGNOSIS — R922 Inconclusive mammogram: Secondary | ICD-10-CM | POA: Diagnosis not present

## 2021-02-12 DIAGNOSIS — N6002 Solitary cyst of left breast: Secondary | ICD-10-CM | POA: Diagnosis not present

## 2021-02-12 LAB — CBC WITH DIFFERENTIAL/PLATELET
Basophils Absolute: 0.1 10*3/uL (ref 0.0–0.1)
Basophils Relative: 0.6 % (ref 0.0–3.0)
Eosinophils Absolute: 0.1 10*3/uL (ref 0.0–0.7)
Eosinophils Relative: 1.2 % (ref 0.0–5.0)
HCT: 39.9 % (ref 36.0–46.0)
Hemoglobin: 13.3 g/dL (ref 12.0–15.0)
Lymphocytes Relative: 35.9 % (ref 12.0–46.0)
Lymphs Abs: 3 10*3/uL (ref 0.7–4.0)
MCHC: 33.5 g/dL (ref 30.0–36.0)
MCV: 89.7 fl (ref 78.0–100.0)
Monocytes Absolute: 0.5 10*3/uL (ref 0.1–1.0)
Monocytes Relative: 5.9 % (ref 3.0–12.0)
Neutro Abs: 4.7 10*3/uL (ref 1.4–7.7)
Neutrophils Relative %: 56.4 % (ref 43.0–77.0)
Platelets: 242 10*3/uL (ref 150.0–400.0)
RBC: 4.45 Mil/uL (ref 3.87–5.11)
RDW: 13 % (ref 11.5–15.5)
WBC: 8.4 10*3/uL (ref 4.0–10.5)

## 2021-02-12 LAB — SEDIMENTATION RATE: Sed Rate: 8 mm/hr (ref 0–30)

## 2021-02-12 LAB — COMPREHENSIVE METABOLIC PANEL
ALT: 9 U/L (ref 0–35)
AST: 12 U/L (ref 0–37)
Albumin: 3.9 g/dL (ref 3.5–5.2)
Alkaline Phosphatase: 59 U/L (ref 39–117)
BUN: 14 mg/dL (ref 6–23)
CO2: 28 mEq/L (ref 19–32)
Calcium: 8.7 mg/dL (ref 8.4–10.5)
Chloride: 106 mEq/L (ref 96–112)
Creatinine, Ser: 0.69 mg/dL (ref 0.40–1.20)
GFR: 101.13 mL/min (ref >60.00)
Glucose, Bld: 93 mg/dL (ref 70–99)
Potassium: 4 mEq/L (ref 3.5–5.1)
Sodium: 140 mEq/L (ref 135–145)
Total Bilirubin: 0.6 mg/dL (ref 0.2–1.2)
Total Protein: 6.4 g/dL (ref 6.0–8.3)

## 2021-02-12 LAB — TSH: TSH: 0.98 u[IU]/mL (ref 0.35–4.50)

## 2021-02-12 NOTE — Progress Notes (Signed)
Subjective:   By signing my name below, I, Shehryar Baig, attest that this documentation has been prepared under the direction and in the presence of [name]. 02/12/2021      Patient ID: Janice Zuniga, female    DOB: March 21, 1971, 50 y.o.   MRN: [id number]  No chief complaint on file.   HPI   Patient is in today for a office visit. She complains of feeling tired and exhaustion for the past 3 weeks. She sleeps 7-8 hours daily. She exercises daily but still feels fatigued while completing it. She denies having any recent Covid-19 infection, fever, swollen glands, cough, cold symptoms, burning, frequency, and diarrhea at this time. She has gotten 3 Covid-19 vaccines and is willing to receive her second booster.      Past Medical History:  Diagnosis Date  . Abnormal CT of the abdomen   . Abnormal uterine bleeding   . Anemia    iron def  . Ankylosing spondylitis ([place])   . Chicken pox   . Colitis   . Crohn disease ([place])   . Dysrhythmia    occassional fluttering noted by patient  . Genital warts   . Gluten intolerance   . History of chlamydia   . History of hysterectomy 08/21/15  . HLA B27 (HLA B27 positive)   . HSV-1 (herpes simplex virus 1) infection   . Low calcium levels   . Low iron   . Low serum vitamin D   . Muscle spasms of both lower extremities    Spasms in lower back  . Non-celiac gluten sensitivity   . Reactive airway disease   . Spondyloarthritis    genetic marker  . UTI (lower urinary tract infection)     Past Surgical History:  Procedure Laterality Date  . ABDOMINAL HYSTERECTOMY  08/21/15  . BILATERAL SALPINGECTOMY Bilateral 08/21/2015   Procedure: BILATERAL SALPINGECTOMY;  Surgeon: Nunzio Cobbs, MD;  Location: [facility];  Service: Gynecology;  Laterality: Bilateral;  . COLONOSCOPY    . CYSTOSCOPY N/A 08/21/2015   Procedure: CYSTOSCOPY;  Surgeon: Nunzio Cobbs, MD;  Location: [facility];  Service: Gynecology;   Laterality: N/A;  . MOUTH SURGERY     cyst in mouth  . ROBOTIC ASSISTED TOTAL HYSTERECTOMY WITH SALPINGECTOMY Bilateral 08/21/2015   Procedure: ROBOTIC ASSISTED TOTAL HYSTERECTOMY ;  Surgeon: Nunzio Cobbs, MD;  Location: [facility];  Service: Gynecology;  Laterality: Bilateral;  . WISDOM TOOTH EXTRACTION      Family History  Problem Relation Age of Onset  . Hypertension Mother   . Diabetes Mother        ? father  . Anxiety disorder Mother   . Cancer Paternal Grandmother        ? type  . Diabetes Father   . Colon polyps Father   . Prostate cancer Father     Social History   Socioeconomic History  . Marital status: Married    Spouse name: Not on file  . Number of children: Not on file  . Years of education: Not on file  . Highest education level: Not on file  Occupational History  . Not on file  Tobacco Use  . Smoking status: Never Smoker  . Smokeless tobacco: Never Used  Vaping Use  . Vaping Use: Never used  Substance and Sexual Activity  . Alcohol use: Yes    Alcohol/week: 4.0 - 5.0 standard drinks    Types: 4 - 5  Glasses of wine per week  . Drug use: No  . Sexual activity: Yes    Partners: Male    Comment: Hyst  Other Topics Concern  . 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 [name]. [name].    Got 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 estradiol (ESTRACE) 0.1 MG/GM vaginal cream Use 1/2 g  vaginally every night for the first 2 weeks, then use 1/2 g vaginally two or three times per week as needed to maintain symptom relief. 59.1 g 0  . folic acid (FOLVITE) 1 MG tablet Take 1 tablet by mouth daily.    Marland Kitchen HUMIRA PEN 40 MG/0.4ML PNKT SMARTSIG:40 Milligram(s) SUB-Q Every 2 Weeks    . methotrexate (RHEUMATREX) 2.5 MG tablet Take 2.5 mg by mouth once a week. Caution:Chemotherapy. Protect from light.    . nitrofurantoin (MACRODANTIN) 50 MG capsule Take 50 mg by mouth daily.    . valACYclovir (VALTREX) 500 MG tablet Take one tab (500 mg) PO bid x 3 days prn for genital outbreak.  Take 4 tabs (2000 mg) orally every 12 hours for 24 hours for oral outbreak. 30 tablet 2   No facility-administered medications prior to visit.    Allergies  Allergen Reactions  . Gluten Meal Other (See Comments)    Auto immune  . Tramadol     nausea  . Codeine Nausea And Vomiting  . Peanut-Containing Drug Products Other (See Comments)    By allergy test...has eaten peanuts without issues     Review of Systems  Constitutional: Positive for malaise/fatigue. Negative for fever.       (-)Swollen glands  HENT: Negative for congestion.   Respiratory: Negative for cough.   Gastrointestinal: Negative for diarrhea.  Genitourinary: Negative for dysuria and frequency.  Psychiatric/Behavioral: The patient has insomnia.        Objective:    Physical Exam Constitutional:      Appearance: Normal appearance.  HENT:     Head: Normocephalic and atraumatic.     Right Ear: External ear normal.     Left Ear: External ear normal.  Eyes:     Extraocular Movements: Extraocular movements intact.     Pupils: Pupils are equal, round, and reactive to light.  Cardiovascular:     Rate and Rhythm: Normal rate and regular rhythm.     Pulses: Normal pulses.     Heart sounds: Normal heart sounds. No murmur heard. No gallop.   Pulmonary:     Effort: Pulmonary effort is normal. No respiratory distress.     Breath sounds:  Normal breath sounds. No wheezing, rhonchi or rales.  Abdominal:     General: Bowel sounds are normal. There is no distension.     Palpations: Abdomen is soft. There is no mass.     Tenderness: There is no abdominal tenderness. There is no guarding or  rebound.     Hernia: No hernia is present.  Lymphadenopathy:     Cervical: No cervical adenopathy.  Skin:    General: Skin is warm and dry.  Neurological:     Mental Status: She is alert and oriented to person, place, and time.  Psychiatric:        Behavior: Behavior normal.     LMP 08/07/2015  Wt Readings from Last 3 Encounters:  01/04/21 123 lb (55.8 kg)  10/23/20 124 lb (56.2 kg)  09/08/20 124 lb (56.2 kg)    Diabetic Foot Exam - Simple   No data filed    Lab Results  Component Value Date   WBC 7.4 04/15/2018   HGB 13.6 04/15/2018   HCT 41.7 04/15/2018   PLT 233 04/15/2018   GLUCOSE 82 04/15/2018   CHOL 140 12/16/2017   TRIG 43.0 12/16/2017   HDL 51.50 12/16/2017   LDLCALC 80 12/16/2017   ALT 14 04/15/2018   AST 14 04/15/2018   NA 138 04/15/2018   K 4.1 04/15/2018   CL 100 04/15/2018   CREATININE 0.59 04/15/2018   BUN 10 04/15/2018   CO2 27 04/15/2018   TSH 1.000 04/15/2018    Lab Results  Component Value Date   TSH 1.000 04/15/2018   Lab Results  Component Value Date   WBC 7.4 04/15/2018   HGB 13.6 04/15/2018   HCT 41.7 04/15/2018   MCV 92 04/15/2018   PLT 233 04/15/2018   Lab Results  Component Value Date   NA 138 04/15/2018   K 4.1 04/15/2018   CO2 27 04/15/2018   GLUCOSE 82 04/15/2018   BUN 10 04/15/2018   CREATININE 0.59 04/15/2018   BILITOT 0.8 04/15/2018   ALKPHOS 73 04/15/2018   AST 14 04/15/2018   ALT 14 04/15/2018   PROT 6.6 04/15/2018   ALBUMIN 4.2 04/15/2018   CALCIUM 9.1 04/15/2018   ANIONGAP 8 08/23/2015   GFR 165.99 12/16/2017   Lab Results  Component Value Date   CHOL 140 12/16/2017   Lab Results  Component Value Date   HDL 51.50 12/16/2017   Lab Results   Component Value Date   LDLCALC 80 12/16/2017   Lab Results  Component Value Date   TRIG 43.0 12/16/2017   Lab Results  Component Value Date   CHOLHDL 3 12/16/2017   No results found for: HGBA1C     Assessment & Plan:   Problem List Items Addressed This Visit   None      No orders of the defined types were placed in this encounter.   I, [name], personally preformed the services described in this documentation.  All medical record entries made by the scribe were at my direction and in my presence.  I have reviewed the chart and discharge instructions (if applicable) and agree that the record reflects my personal performance and is accurate and complete. 02/12/2021   I,Shehryar Baig,acting as a Education administrator for Nance Pear, NP.,have documented all relevant documentation on the behalf of Nance Pear, NP,as directed by  Nance Pear, NP while in the presence of Nance Pear, NP.   Shehryar [employer]

## 2021-02-12 NOTE — Assessment & Plan Note (Signed)
Will check cbc, cmet, tsh and esr.  Possible causes include stress, autoimmune flare.  Further recommendations following result review.

## 2021-02-12 NOTE — Patient Instructions (Signed)
Please complete lab work prior to leaving. We will send you your results via mychart.

## 2021-02-16 DIAGNOSIS — M450 Ankylosing spondylitis of multiple sites in spine: Secondary | ICD-10-CM | POA: Diagnosis not present

## 2021-02-16 DIAGNOSIS — E559 Vitamin D deficiency, unspecified: Secondary | ICD-10-CM | POA: Diagnosis not present

## 2021-04-23 ENCOUNTER — Ambulatory Visit: Payer: Federal, State, Local not specified - PPO | Admitting: Family

## 2021-04-24 ENCOUNTER — Ambulatory Visit: Payer: Federal, State, Local not specified - PPO | Admitting: Family

## 2021-04-24 ENCOUNTER — Encounter: Payer: Self-pay | Admitting: Family

## 2021-04-24 ENCOUNTER — Other Ambulatory Visit: Payer: Self-pay

## 2021-04-24 VITALS — BP 118/72 | HR 66 | Resp 17 | Ht 63.5 in | Wt 122.0 lb

## 2021-04-24 DIAGNOSIS — R61 Generalized hyperhidrosis: Secondary | ICD-10-CM | POA: Diagnosis not present

## 2021-04-24 DIAGNOSIS — R5383 Other fatigue: Secondary | ICD-10-CM

## 2021-04-24 DIAGNOSIS — N951 Menopausal and female climacteric states: Secondary | ICD-10-CM | POA: Insufficient documentation

## 2021-04-24 LAB — CBC WITH DIFFERENTIAL/PLATELET
Basophils Absolute: 0 10*3/uL (ref 0.0–0.1)
Basophils Relative: 0.5 % (ref 0.0–3.0)
Eosinophils Absolute: 0.1 10*3/uL (ref 0.0–0.7)
Eosinophils Relative: 2.2 % (ref 0.0–5.0)
HCT: 40.7 % (ref 36.0–46.0)
Hemoglobin: 13.7 g/dL (ref 12.0–15.0)
Lymphocytes Relative: 41.2 % (ref 12.0–46.0)
Lymphs Abs: 2.6 10*3/uL (ref 0.7–4.0)
MCHC: 33.6 g/dL (ref 30.0–36.0)
MCV: 88.8 fl (ref 78.0–100.0)
Monocytes Absolute: 0.4 10*3/uL (ref 0.1–1.0)
Monocytes Relative: 6.1 % (ref 3.0–12.0)
Neutro Abs: 3.1 10*3/uL (ref 1.4–7.7)
Neutrophils Relative %: 50 % (ref 43.0–77.0)
Platelets: 191 10*3/uL (ref 150.0–400.0)
RBC: 4.58 Mil/uL (ref 3.87–5.11)
RDW: 13.5 % (ref 11.5–15.5)
WBC: 6.2 10*3/uL (ref 4.0–10.5)

## 2021-04-24 LAB — TSH: TSH: 1.2 u[IU]/mL (ref 0.35–5.50)

## 2021-04-24 LAB — LUTEINIZING HORMONE: LH: 52.88 m[IU]/mL

## 2021-04-24 LAB — FOLLICLE STIMULATING HORMONE: FSH: 58.2 m[IU]/mL

## 2021-04-24 MED ORDER — GABAPENTIN 300 MG PO CAPS: 300.0000 mg | ORAL_CAPSULE | Freq: Every day | ORAL | 0 refills | Status: DC

## 2021-04-24 NOTE — Progress Notes (Signed)
Subjective:     Patient ID: Janice Zuniga, female    DOB: 06/27/1971, 51 y.o.   MRN: [id number]  Chief Complaint  Patient presents with   Fatigue    Fatigue, night sweats, several weeks to one month    HPI Patient is in today to discuss fatigue/night sweats. This has been occurring for several weeks to one month. Reports that she is not sleeping well because she gets hot really easily.  During the day she might get a little warm only.  Notes extreme fatigue the last few weeks.  She started exercising which has helped with her energy.  She took a covid test this AM which was negative. She had a hysterectomy.  Does not have menses.  She reports that she did have a small amount of blood on the toilet paper over the weekend.  She had constipation and strained prior.    Health Maintenance Due  Topic Date Due   HIV Screening  Never done   Hepatitis C Screening  Never done   Zoster Vaccines- Shingrix (1 of 2) Never done   COLONOSCOPY (Pts 45-50yr Insurance coverage will need to be confirmed)  06/28/2017   COVID-19 Vaccine (4 - Booster for Pfizer series) 08/22/2020   INFLUENZA VACCINE  04/23/2021    Past Medical History:  Diagnosis Date   Abnormal CT of the abdomen    Abnormal uterine bleeding    Anemia    iron def   Ankylosing spondylitis ([facility])    Chicken pox    Colitis    [place]    Dysrhythmia    occassional fluttering noted by patient   Genital warts    Gluten intolerance    History of chlamydia    History of hysterectomy 08/21/15   HLA B27 (HLA B27 positive)    HSV-1 (herpes simplex virus 1) infection    Low calcium levels    Low iron    Low serum vitamin D    Muscle spasms of both lower extremities    Spasms in lower back   Non-celiac gluten sensitivity    Reactive airway disease    Spondyloarthritis    genetic marker   UTI (lower urinary tract infection)     Past Surgical History:  Procedure Laterality Date   ABDOMINAL HYSTERECTOMY  08/21/15    BILATERAL SALPINGECTOMY Bilateral 08/21/2015   Procedure: BILATERAL SALPINGECTOMY;  Surgeon: BNunzio Cobbs MD;  [facility];  Service: Gynecology;  Laterality: Bilateral;   COLONOSCOPY     CYSTOSCOPY N/A 08/21/2015   Procedure: CYSTOSCOPY;  Surgeon: BNunzio Cobbs MD;  [facility];  Service: Gynecology;  Laterality: N/A;   MOUTH SURGERY     cyst in mouth   ROBOTIC ASSISTED TOTAL HYSTERECTOMY WITH SALPINGECTOMY Bilateral 08/21/2015   Procedure: ROBOTIC ASSISTED TOTAL HYSTERECTOMY ;  Surgeon: BNunzio Cobbs MD;  [facility];  Service: Gynecology;  Laterality: Bilateral;   WISDOM TOOTH EXTRACTION      Family History  Problem Relation Age of Onset   Hypertension Mother    Diabetes Mother        ? father   Anxiety disorder Mother    Cancer Paternal Grandmother        ? type   Diabetes Father    Colon polyps Father    Prostate cancer Father     Social History   Socioeconomic History   Marital status: Married    Spouse name:  Not on file   Number of children: Not on file   Years of education: Not on file   Highest education level: Not on file  Occupational History   Not on file  Tobacco Use   Smoking status: Never   Smokeless tobacco: Never  Vaping Use   Vaping Use: Never used  Substance and Sexual Activity   Alcohol use: Yes    Alcohol/week: 4.0 - 5.0 standard drinks    Types: 4 - 5 Glasses of wine per week   Drug use: No   Sexual activity: Yes    Partners: Male    Comment: Hyst  Other Topics Concern   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 [name]. [name].    Got 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 50.0 g 0   folic acid (FOLVITE) 1 MG tablet Take 1 tablet by mouth daily.     HUMIRA PEN 40 MG/0.4ML PNKT SMARTSIG:40 Milligram(s) SUB-Q Every 2 Weeks     methotrexate (RHEUMATREX) 2.5 MG tablet Take 2.5 mg by mouth once a week. Caution:Chemotherapy. Protect from light.     valACYclovir (VALTREX) 500 MG tablet Take one tab (500 mg) PO bid x 3 days prn for genital outbreak.  Take 4 tabs (2000 mg) orally every 12 hours for 24 hours for oral outbreak. 30 tablet 2   nitrofurantoin (MACRODANTIN) 50 MG capsule Take 50 mg by mouth daily.     No facility-administered medications prior to visit.    Allergies  Allergen Reactions   Gluten Meal Other (See Comments)    Auto immune   Tramadol     nausea   Codeine Nausea And Vomiting   Peanut-Containing Drug Products Other (See Comments)    By allergy test...has eaten peanuts without issues     ROS     Objective:    Physical Exam Constitutional:      Appearance: Normal appearance. She is well-developed.  Cardiovascular:     Rate and Rhythm: Normal rate and regular rhythm.     Heart sounds: Normal heart sounds. No murmur heard. Pulmonary:     Effort: Pulmonary effort is normal. No respiratory distress.     Breath sounds: Normal breath sounds. No wheezing.  Lymphadenopathy:     Cervical: No cervical adenopathy.  Neurological:     Mental Status: She is alert.  Psychiatric:        Behavior: Behavior normal.        Thought Content: Thought content normal.        Judgment: Judgment normal.    BP 118/72 (BP  Location: Left Arm, Patient Position: Sitting, Cuff Size: Normal)   Pulse  66   Resp 17   Ht 5' 3.5" (1.613 m)   Wt 122 lb (55.3 kg)   LMP 08/07/2015   SpO2 99%   BMI 21.27 kg/m  Wt Readings from Last 3 Encounters:  04/24/21 122 lb (55.3 kg)  02/12/21 124 lb (56.2 kg)  01/04/21 123 lb (55.8 kg)       Assessment & Plan:   Problem List Items Addressed This Visit       Unprioritized   Night sweats - Primary    New.  Suspect that this is due to menopause.  Will check hormonal studies.  Also, check HIV.        Relevant Orders   CBC with Differential/Platelet   Estradiol   FSH   LH   HIV antibody (with reflex)   Fatigue   Relevant Orders   TSH   HIV antibody (with reflex)    I have discontinued Annie Main. Goins's nitrofurantoin. I am also having her start on gabapentin. Additionally, I am having her maintain her folic acid, methotrexate, Humira Pen, estradiol, and valACYclovir.  Meds ordered this encounter  Medications   gabapentin (NEURONTIN) 300 MG capsule    Sig: Take 1 capsule (300 mg total) by mouth at bedtime.    Dispense:  90 capsule    Refill:  0    Order Specific Question:   Supervising Provider    Answer:   Penni Homans A [[id number]]

## 2021-04-24 NOTE — Assessment & Plan Note (Addendum)
New.  Suspect that this is due to menopause.  Will check hormonal studies.  Also, check HIV. Trial of gabapentin 371m once daily QHS.

## 2021-04-24 NOTE — Patient Instructions (Signed)
Please complete lab work prior to leaving. Start gabapentin 339m once daily.

## 2021-04-25 ENCOUNTER — Encounter: Payer: Self-pay | Admitting: Family

## 2021-04-25 LAB — HIV ANTIBODY (ROUTINE TESTING W REFLEX): HIV 1&2 Ab, 4th Generation: NONREACTIVE

## 2021-04-25 LAB — ESTRADIOL: Estradiol: <15 pg/mL

## 2021-05-03 ENCOUNTER — Telehealth: Payer: Self-pay

## 2021-05-03 NOTE — Telephone Encounter (Signed)
Patient called. She said the Dr. Quincy Simmonds gave her two Rx's for UTI. One 175m Macrobid for acute infection and one Macrobid 50 to use prophylactically after intercourse.  Patient called stating that just starting this afternoon she feels like UTI starting. C/obladder feels full ie. "a fullness feeling", irritation at end of urination.    She no longer has 100 mg Macrobid on hand and was asking if she could use the 50 mg to treat this infection. She said she did already take 2 of the 50 mg this afternoon.  She called for appointment but was told Monday would be first available.  Drug Allergies: Tramadol                          Codeine

## 2021-05-03 NOTE — Telephone Encounter (Signed)
Drake for patient to take Nitrofurantoin 50 mg,  Take 2 tablets (100 mg) by mouth twice daily for 5 days.   Office visit if symptoms do not resolve.

## 2021-05-03 NOTE — Telephone Encounter (Signed)
Spoke with patient and informed her. °

## 2021-05-07 DIAGNOSIS — M8589 Other specified disorders of bone density and structure, multiple sites: Secondary | ICD-10-CM | POA: Diagnosis not present

## 2021-05-07 DIAGNOSIS — Z1589 Genetic susceptibility to other disease: Secondary | ICD-10-CM | POA: Diagnosis not present

## 2021-05-07 DIAGNOSIS — M450 Ankylosing spondylitis of multiple sites in spine: Secondary | ICD-10-CM | POA: Diagnosis not present

## 2021-05-07 DIAGNOSIS — Z79899 Other long term (current) drug therapy: Secondary | ICD-10-CM | POA: Diagnosis not present

## 2021-05-22 ENCOUNTER — Other Ambulatory Visit: Payer: Self-pay

## 2021-05-22 ENCOUNTER — Ambulatory Visit: Payer: Federal, State, Local not specified - PPO | Admitting: Family

## 2021-05-22 ENCOUNTER — Encounter: Payer: Self-pay | Admitting: Family

## 2021-05-22 VITALS — BP 110/70 | HR 63 | Temp 98.2°F | Ht 63.5 in | Wt 123.8 lb

## 2021-05-22 DIAGNOSIS — N951 Menopausal and female climacteric states: Secondary | ICD-10-CM | POA: Diagnosis not present

## 2021-05-22 NOTE — Assessment & Plan Note (Signed)
Stable. She is doing well with a natural OTC mineral supplement. Continue same.

## 2021-05-22 NOTE — Progress Notes (Signed)
Subjective:     Patient ID: Janice Zuniga, female    DOB: 09-08-1971, 50 y.o.   MRN: [id number]  Chief Complaint  Patient presents with   Night Sweats    It is getting better     HPI Patient is in today for follow up. Last visit she complained of fatigue and night sweats.  Lab work performed included a negative HIV test, normal cbc, normal tsh and hormonal studies in the menopausal range. We added gabapentin 38m PO HS. She decided not to try gabapentin and instead to start something natural. She added a natural minerals combo with zinc, magnesium, calcium and she has noticed resolution of her hot flashes and increased energy.   Health Maintenance Due  Topic Date Due   Hepatitis C Screening  Never done   Zoster Vaccines- Shingrix (1 of 2) Never done   COLONOSCOPY (Pts 45-422yrInsurance coverage will need to be confirmed)  06/28/2017   COVID-19 Vaccine (4 - Booster for Pfizer series) 08/22/2020   INFLUENZA VACCINE  04/23/2021    Past Medical History:  Diagnosis Date   Abnormal CT of the abdomen    Abnormal uterine bleeding    Anemia    iron def   Ankylosing spondylitis ([facility])    Chicken pox    Colitis    [place] disease ([facility]   Dysrhythmia    occassional fluttering noted by patient   Genital warts    Gluten intolerance    History of chlamydia    History of hysterectomy 08/21/15   HLA B27 (HLA B27 positive)    HSV-1 (herpes simplex virus 1) infection    Low calcium levels    Low iron    Low serum vitamin D    Muscle spasms of both lower extremities    Spasms in lower back   Non-celiac gluten sensitivity    Reactive airway disease    Spondyloarthritis    genetic marker   UTI (lower urinary tract infection)     Past Surgical History:  Procedure Laterality Date   ABDOMINAL HYSTERECTOMY  08/21/15   BILATERAL SALPINGECTOMY Bilateral 08/21/2015   Procedure: BILATERAL SALPINGECTOMY;  Surgeon: BrNunzio CobbsMD;  [facility];  Service:  Gynecology;  Laterality: Bilateral;   COLONOSCOPY     CYSTOSCOPY N/A 08/21/2015   Procedure: CYSTOSCOPY;  Surgeon: BrNunzio CobbsMD;  [facility];  Service: Gynecology;  Laterality: N/A;   MOUTH SURGERY     cyst in mouth   ROBOTIC ASSISTED TOTAL HYSTERECTOMY WITH SALPINGECTOMY Bilateral 08/21/2015   Procedure: ROBOTIC ASSISTED TOTAL HYSTERECTOMY ;  Surgeon: BrNunzio CobbsMD;  [facility];  Service: Gynecology;  Laterality: Bilateral;   WISDOM TOOTH EXTRACTION      Family History  Problem Relation Age of Onset   Hypertension Mother    Diabetes Mother        ? father   Anxiety disorder Mother    Cancer Paternal Grandmother        ? type   Diabetes Father    Colon polyps Father    Prostate cancer Father     Social History   Socioeconomic History   Marital status: Married    Spouse name: Not on file   Number of children: Not on file   Years of education: Not on file   Highest education level: Not on file  Occupational History   Not on file  Tobacco Use  Smoking status: Never   Smokeless tobacco: Never  Vaping Use   Vaping Use: Never used  Substance and Sexual Activity   Alcohol use: Yes    Alcohol/week: 4.0 - 5.0 standard drinks    Types: 4 - 5 Glasses of wine per week   Drug use: No   Sexual activity: Yes    Partners: Male    Comment: Hyst  Other Topics Concern   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 [name]. [name].    Got 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 03.7 g 0   folic acid (FOLVITE) 1 MG tablet Take 1 tablet by mouth daily.     HUMIRA PEN 40 MG/0.4ML PNKT SMARTSIG:40 Milligram(s) SUB-Q Every 2 Weeks     methotrexate (RHEUMATREX) 2.5 MG tablet Take 2.5 mg by mouth once a week. Caution:Chemotherapy. Protect from light.     valACYclovir (VALTREX) 500 MG tablet Take one tab (500 mg) PO bid x 3 days prn for genital outbreak.  Take 4 tabs (2000 mg) orally every 12 hours for 24 hours for oral outbreak. 30 tablet 2   gabapentin (NEURONTIN) 300 MG capsule Take 1 capsule (300 mg total) by mouth at bedtime. 90 capsule 0   No facility-administered medications prior to visit.    Allergies  Allergen Reactions   Gluten Meal Other (See Comments)    Auto immune   Tramadol     nausea   Codeine Nausea And Vomiting   Peanut-Containing Drug Products Other (See Comments)    By allergy test...has eaten peanuts without issues     ROS    See HPI  Objective:    Physical Exam Constitutional:      Appearance: Normal appearance. She is normal weight.  Musculoskeletal:        General: No swelling.  Neurological:     Mental Status: She is alert and oriented to person, place, and time.  Psychiatric:        Mood and Affect: Mood normal.        Behavior: Behavior normal.        Thought Content: Thought content normal.        Judgment: Judgment normal.    BP 110/70   Pulse 63   Temp 98.2 F (36.8 C)   Ht 5' 3.5" (1.613 m)   Wt 123 lb 12.8 oz (56.2 kg)   LMP 08/07/2015   SpO2 99%   BMI 21.59 kg/m  Wt Readings from Last 3 Encounters:  05/22/21 123 lb 12.8 oz (56.2 kg)  04/24/21 122 lb (55.3 kg)  02/12/21 124 lb (56.2 kg)       Assessment &  Plan:   Problem List  Items Addressed This Visit       Unprioritized   Menopausal syndrome (hot flashes) - Primary    Stable. She is doing well with a natural OTC mineral supplement. Continue same.        I have discontinued Susi Goslin. Minium's gabapentin. I am also having her maintain her folic acid, methotrexate, Humira Pen, estradiol, and valACYclovir.  No orders of the defined types were placed in this encounter.

## 2021-06-18 ENCOUNTER — Ambulatory Visit: Payer: Federal, State, Local not specified - PPO | Admitting: Family

## 2021-06-18 ENCOUNTER — Other Ambulatory Visit: Payer: Self-pay

## 2021-06-18 VITALS — BP 125/80 | HR 62 | Temp 98.3°F | Resp 16 | Wt 124.0 lb

## 2021-06-18 DIAGNOSIS — J40 Bronchitis, not specified as acute or chronic: Secondary | ICD-10-CM | POA: Diagnosis not present

## 2021-06-18 MED ORDER — AZITHROMYCIN 250 MG PO TABS: ORAL_TABLET | ORAL | 0 refills | Status: AC

## 2021-06-18 NOTE — Assessment & Plan Note (Signed)
New. Pt is advised as follows:  Delsym as needed for cough suppressant.  Mucinex 660m twice daily as needed for chest congestion. Try adding claritin 10g once daily.  Call if symptoms worsen or if symptoms are not improved in 3-4 days.

## 2021-06-18 NOTE — Patient Instructions (Signed)
Delsym as needed for cough suppressant.  Mucinex 658m twice daily as needed for chest congestion. Try adding claritin 10g once daily.  Call if symptoms worsen or if symptoms are not improved in 3-4 days.

## 2021-06-18 NOTE — Progress Notes (Signed)
Subjective:   By signing my name below, I, Janice Zuniga, attest that this documentation has been prepared under the direction and in the presence of Janice Zuniga. 06/18/2021      Patient ID: Janice Zuniga, female    DOB: May 16, 1971, 50 y.o.   MRN: [id number]  Chief Complaint  Patient presents with   Cough    Complains of cough for over a week   Nasal Congestion    Complains of nasal congestion, now on the chest    HPI Patient is in today for a office visit. She complains of congestion, cough, mucous draining in throat, chest congestion, and dark mucous since 06/09/2021. She was taking Claritin D to manage her symptoms but stopped due to struggling to sleep at night. She is currently taking mucinex to manage her congestion. She denies having any fever and wheezing at this time. She reports testing negative for Covid-19. Her son has similar symptoms but has also tested negative for Covid-19.  Immunizations- She is willing to get the new Covid-19 booster vaccine and the flu vaccine at a later date.    Health Maintenance Due  Topic Date Due   Hepatitis C Screening  Never done   Zoster Vaccines- Shingrix (1 of 2) Never done   COLONOSCOPY (Pts 45-47yr Insurance coverage will need to be confirmed)  06/28/2017   COVID-19 Vaccine (4 - Booster for Pfizer series) 08/15/2020   INFLUENZA VACCINE  04/23/2021    Past Medical History:  Diagnosis Date   Abnormal CT of the abdomen    Abnormal uterine bleeding    Anemia    iron def   Ankylosing spondylitis (HCC)    Chicken pox    Colitis    [place]    Dysrhythmia    occassional fluttering noted by patient   Genital warts    Gluten intolerance    History of chlamydia    History of hysterectomy 08/21/15   HLA B27 (HLA B27 positive)    HSV-1 (herpes simplex virus 1) infection    Low calcium levels    Low iron    Low serum vitamin D    Muscle spasms of both lower extremities    Spasms in lower back    Non-celiac gluten sensitivity    Reactive airway disease    Spondyloarthritis    genetic marker   UTI (lower urinary tract infection)     Past Surgical History:  Procedure Laterality Date   ABDOMINAL HYSTERECTOMY  08/21/15   BILATERAL SALPINGECTOMY Bilateral 08/21/2015   Procedure: BILATERAL SALPINGECTOMY;  Surgeon: BNunzio Cobbs MD;  [facility];  Service: Gynecology;  Laterality: Bilateral;   COLONOSCOPY     CYSTOSCOPY N/A 08/21/2015   Procedure: CYSTOSCOPY;  Surgeon: BNunzio Cobbs MD;  [facility];  Service: Gynecology;  Laterality: N/A;   MOUTH SURGERY     cyst in mouth   ROBOTIC ASSISTED TOTAL HYSTERECTOMY WITH SALPINGECTOMY Bilateral 08/21/2015   Procedure: ROBOTIC ASSISTED TOTAL HYSTERECTOMY ;  Surgeon: BNunzio Cobbs MD;  [facility];  Service: Gynecology;  Laterality: Bilateral;   WISDOM TOOTH EXTRACTION      Family History  Problem Relation Age of Onset   Hypertension Mother    Diabetes Mother        ? father   Anxiety disorder Mother    Cancer Paternal Grandmother        ? type   Diabetes Father  Colon polyps Father    Prostate cancer Father     Social History   Socioeconomic History   Marital status: Married    Spouse name: Not on file   Number of children: Not on file   Years of education: Not on file   Highest education level: Not on file  Occupational History   Not on file  Tobacco Use   Smoking status: Never   Smokeless tobacco: Never  Vaping Use   Vaping Use: Never used  Substance and Sexual Activity   Alcohol use: Yes    Alcohol/week: 4.0 - 5.0 standard drinks    Types: 4 - 5 Glasses of wine per week   Drug use: No   Sexual activity: Yes    Partners: Male    Comment: Hyst  Other Topics Concern   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 Janice Zuniga '95. Janice 03' Zuniga 05'.    Got B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 11.9 g 0   folic acid (FOLVITE) 1 MG tablet Take 1 tablet by mouth daily.     HUMIRA PEN 40 MG/0.4ML PNKT SMARTSIG:40 Milligram(s) SUB-Q Every 2 Weeks     methotrexate (RHEUMATREX) 2.5 MG tablet Take 2.5 mg by mouth once a week. Caution:Chemotherapy. Protect from light.     valACYclovir (VALTREX) 500 MG tablet Take one tab (500 mg) PO bid x 3 days prn for genital outbreak.  Take 4 tabs (2000 mg) orally every 12 hours for 24 hours for oral outbreak. 30 tablet 2   No facility-administered medications prior to visit.    Allergies  Allergen Reactions   Gluten Meal Other (See Comments)    Auto immune   Tramadol     nausea   Codeine Nausea And Vomiting   Peanut-Containing Drug Products Other (See Comments)    By allergy test...has eaten peanuts without issues     Review of Systems  Constitutional:  Negative for fever.  HENT:  Positive for congestion.        (+)mucous draining in throat (+)Dark color mucous  Respiratory:  Positive for cough. Negative for wheezing.        (+)chest congestion      Objective:    Physical Exam Constitutional:      General: She is not in acute distress.    Appearance: Normal appearance. She is not ill-appearing.  HENT:     Head: Normocephalic and atraumatic.     Right Ear: Tympanic membrane, ear canal  and external ear normal.     Left Ear: Tympanic membrane, ear canal and external ear normal.  Eyes:     Extraocular Movements: Extraocular movements intact.  Pupils: Pupils are equal, round, and reactive to light.  Cardiovascular:     Rate and Rhythm: Normal rate and regular rhythm.     Heart sounds: Normal heart sounds. No murmur heard.   No gallop.  Pulmonary:     Effort: Pulmonary effort is normal. No respiratory distress.     Breath sounds: Normal breath sounds. No wheezing or rales.  Skin:    General: Skin is warm and dry.  Neurological:     Mental Status: She is alert and oriented to person, place, and time.  Psychiatric:        Behavior: Behavior normal.    BP 125/80 (BP Location: Right Arm, Patient Position: Sitting, Cuff Size: Small)   Pulse 62   Temp 98.3 F (36.8 C) (Oral)   Resp 16   Wt 124 lb (56.2 kg)   LMP 08/07/2015   SpO2 99%   BMI 21.62 kg/m  Wt Readings from Last 3 Encounters:  06/18/21 124 lb (56.2 kg)  05/22/21 123 lb 12.8 oz (56.2 kg)  04/24/21 122 lb (55.3 kg)       Assessment & Plan:   Problem List Items Addressed This Visit       Unprioritized   Bronchitis - Primary    New. Pt is advised as follows:  Delsym as needed for cough suppressant.  Mucinex 654m twice daily as needed for chest congestion. Try adding claritin 10g once daily.  Call if symptoms worsen or if symptoms are not improved in 3-4 days.         Meds ordered this encounter  Medications   azithromycin (ZITHROMAX) 250 MG tablet    Sig: Take 2 tablets on day 1, then 1 tablet daily on days 2 through 5    Dispense:  6 tablet    Refill:  0    Order Specific Question:   Supervising Provider    Answer:   BPenni HomansA [[id number]]    I, MDebbrah AlarNP, personally preformed the services described in this documentation.  All medical record entries made by the scribe were at my direction and in my presence.  I have reviewed the chart and discharge instructions (if  applicable) and agree that the record reflects my personal performance and is accurate and complete. 06/18/2021   I,Janice Zuniga,acting as a scribe for MNance Pear Zuniga.,have documented all relevant documentation on the behalf of MNance Pear Zuniga,as directed by  MNance Pear Zuniga while in the presence of MNance Pear Zuniga.   MNance Pear Zuniga

## 2021-06-21 ENCOUNTER — Encounter: Payer: Self-pay | Admitting: Family

## 2021-06-21 MED ORDER — FLUCONAZOLE 150 MG PO TABS: 150.0000 mg | ORAL_TABLET | Freq: Once | ORAL | 0 refills | Status: DC

## 2021-07-02 DIAGNOSIS — M8589 Other specified disorders of bone density and structure, multiple sites: Secondary | ICD-10-CM | POA: Diagnosis not present

## 2021-08-13 ENCOUNTER — Encounter (HOSPITAL_BASED_OUTPATIENT_CLINIC_OR_DEPARTMENT_OTHER): Payer: Self-pay

## 2021-08-13 ENCOUNTER — Other Ambulatory Visit: Payer: Self-pay

## 2021-08-13 ENCOUNTER — Emergency Department (HOSPITAL_BASED_OUTPATIENT_CLINIC_OR_DEPARTMENT_OTHER)
Admission: EM | Admit: 2021-08-13 | Discharge: 2021-08-13 | Disposition: A | Discharge status: Home / Self Care | Payer: Federal, State, Local not specified - PPO | Attending: Emergency Medicine | Admitting: Emergency Medicine

## 2021-08-13 ENCOUNTER — Ambulatory Visit: Payer: Federal, State, Local not specified - PPO | Admitting: Family

## 2021-08-13 DIAGNOSIS — S61209A Unspecified open wound of unspecified finger without damage to nail, initial encounter: Secondary | ICD-10-CM

## 2021-08-13 DIAGNOSIS — J45909 Unspecified asthma, uncomplicated: Secondary | ICD-10-CM | POA: Diagnosis not present

## 2021-08-13 DIAGNOSIS — S61112A Laceration without foreign body of left thumb with damage to nail, initial encounter: Secondary | ICD-10-CM | POA: Insufficient documentation

## 2021-08-13 DIAGNOSIS — W260XXA Contact with knife, initial encounter: Secondary | ICD-10-CM | POA: Diagnosis not present

## 2021-08-13 DIAGNOSIS — S61001A Unspecified open wound of right thumb without damage to nail, initial encounter: Secondary | ICD-10-CM | POA: Diagnosis not present

## 2021-08-13 DIAGNOSIS — Z91010 Allergy to peanuts: Secondary | ICD-10-CM | POA: Diagnosis not present

## 2021-08-13 DIAGNOSIS — S6991XA Unspecified injury of right wrist, hand and finger(s), initial encounter: Secondary | ICD-10-CM | POA: Diagnosis not present

## 2021-08-13 MED ORDER — LIDOCAINE-EPINEPHRINE-TETRACAINE (LET) TOPICAL GEL
3.0000 mL | Freq: Once | TOPICAL | Status: AC
  Administered 2021-08-13: 3 mL via TOPICAL
  Filled 2021-08-13: qty 3

## 2021-08-13 NOTE — ED Triage Notes (Signed)
Patient here POV from Home with Right Thumb Injury.   Patient was in the [place] when she sliced a Portion of her Distal Right Thumb off. Patient is unsure of last Tetanus Booster Admin.   Wound Irrigated in Triage and wrapped in Gauze.  NAD Noted during Triage. A&Ox4. GCS 15. Ambulatory.

## 2021-08-13 NOTE — ED Provider Notes (Signed)
[facility] EMERG[facility] DEPT Provider Note   CSN: [id number] Arrival date & time: 08/13/21  2002     History Chief Complaint  Patient presents with   Finger Injury    Janice Zuniga is a 50 y.o. female.   Laceration Location:  Hand Hand laceration location:  R hand Depth:  Cutaneous Quality: avulsion   Bleeding: venous   Time since incident:  3 hours Laceration mechanism:  Knife Pain details:    Quality:  Aching and throbbing   Severity:  Moderate   Timing:  Constant Foreign body present:  No foreign bodies Relieved by:  Nothing Worsened by:  Nothing Tetanus status:  Unknown     Past Medical History:  Diagnosis Date   Abnormal CT of the abdomen    Abnormal uterine bleeding    Anemia    iron def   Ankylosing spondylitis (HCC)    Chicken pox    Colitis    Crohn disease ([place])    Dysrhythmia    occassional fluttering noted by patient   Genital warts    Gluten intolerance    History of chlamydia    History of hysterectomy 08/21/15   HLA B27 (HLA B27 positive)    HSV-1 (herpes simplex virus 1) infection    Low calcium levels    Low iron    Low serum vitamin D    Muscle spasms of both lower extremities    Spasms in lower back   Non-celiac gluten sensitivity    Reactive airway disease    Spondyloarthritis    genetic marker   UTI (lower urinary tract infection)     Patient Active Problem List   Diagnosis Date Noted   Bronchitis 06/18/2021   Menopausal syndrome (hot flashes) 05/22/2021   Night sweats 04/24/2021   Fatigue 02/12/2021   Crohn disease ([place]) 02/18/2017   History of uveitis 02/18/2017   Ankylosing spondylitis of multiple sites in spine ([place]) 01/29/2017   Encounter for long-term (current) use of high-risk medication 01/29/2017   Abnormal loss of weight 11/02/2016   Crohn's disease of both small and large intestine with complication ([place]) 71/16/5790   Elevated ferritin level 11/01/2016   History of colitis 03/29/2016    Elevated serum globulin level 12/29/2015   Depression 09/15/2015   Vitamin D deficiency 09/08/2015   Anemia 09/08/2015   Status post laparoscopic hysterectomy 08/21/2015   Low calcium levels 07/14/2015   Abnormal CT of the abdomen 04/24/2015   Hematochezia 04/24/2015   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/12/2014   Extrinsic asthma 07/12/2014   Iron deficiency anemia 07/11/2014   HLA B27 positive 07/06/2014   Low serum vitamin D    Non-celiac gluten sensitivity    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[place])     Past Surgical History:  Procedure Laterality Date   ABDOMINAL HYSTERECTOMY  08/21/15   BILATERAL SALPINGECTOMY Bilateral 08/21/2015   Procedure: BILATERAL SALPINGECTOMY;  Surgeon: Nunzio Cobbs, MD;  Location: [facility];  Service: Gynecology;  Laterality: Bilateral;   COLONOSCOPY     CYSTOSCOPY N/A 08/21/2015   Procedure: CYSTOSCOPY;  Surgeon: Nunzio Cobbs, MD;  Location: [facility];  Service: Gynecology;  Laterality: N/A;   MOUTH SURGERY     cyst in mouth   ROBOTIC ASSISTED TOTAL HYSTERECTOMY WITH SALPINGECTOMY Bilateral 08/21/2015   Procedure: ROBOTIC ASSISTED TOTAL HYSTERECTOMY ;  Surgeon: Nunzio Cobbs, MD;  Location: [facility];  Service: Gynecology;  Laterality: Bilateral;   WISDOM TOOTH EXTRACTION  OB History     Gravida  5   Para  3   Term  3   Preterm      AB  2   Living  3      SAB  1   IAB  1   Ectopic      Multiple      Live Births              Family History  Problem Relation Age of Onset   Hypertension Mother    Diabetes Mother        ? father   Anxiety disorder Mother    Cancer Paternal Grandmother        ? type   Diabetes Father    Colon polyps Father    Prostate cancer Father     Social History   Tobacco Use   Smoking status: Never   Smokeless tobacco: Never  Vaping Use   Vaping Use: Never used  Substance Use Topics   Alcohol use: Yes    Alcohol/week: 4.0 - 5.0 standard drinks    Types: 4 - 5  Glasses of wine per week   Drug use: No    Home Medications Prior to Admission medications   Medication Sig Start Date End Date Taking? Authorizing Provider  estradiol (ESTRACE) 0.1 MG/GM vaginal cream Use 1/2 g vaginally every night for the first 2 weeks, then use 1/2 g vaginally two or three times per week as needed to maintain symptom relief. 01/04/21   Nunzio Cobbs, MD  folic acid (FOLVITE) 1 MG tablet Take 1 tablet by mouth daily. 04/08/18   [provider]  HUMIRA PEN 40 MG/0.4ML PNKT SMARTSIG:40 Milligram(s) SUB-Q Every 2 Weeks 12/05/20   [provider]  methotrexate (RHEUMATREX) 2.5 MG tablet Take 2.5 mg by mouth once a week. Caution:Chemotherapy. Protect from light.    [provider]  valACYclovir (VALTREX) 500 MG tablet Take one tab (500 mg) PO bid x 3 days prn for genital outbreak.  Take 4 tabs (2000 mg) orally every 12 hours for 24 hours for oral outbreak. 01/04/21   Nunzio Cobbs, MD    Allergies    Gluten meal, Tramadol, Codeine, and Peanut-containing drug products  Review of Systems   Review of Systems  Skin:  Positive for wound.  Neurological:  Negative for weakness and numbness.   Physical Exam Updated Vital Signs BP 132/84   Pulse 73   Temp 98.1 F (36.7 C)   Resp 18   LMP 08/07/2015   SpO2 100%   Physical Exam Vitals and nursing note reviewed.  Constitutional:      General: She is not in acute distress.    Appearance: She is well-developed. She is not diaphoretic.  HENT:     Head: Normocephalic and atraumatic.     Right Ear: External ear normal.     Left Ear: External ear normal.     Nose: Nose normal.     Mouth/Throat:     Mouth: Mucous membranes are moist.  Eyes:     General: No scleral icterus.    Conjunctiva/sclera: Conjunctivae normal.  Cardiovascular:     Rate and Rhythm: Normal rate and regular rhythm.     Heart sounds: Normal heart sounds. No murmur heard.   No friction rub. No gallop.   Pulmonary:     Effort: Pulmonary effort is normal. No respiratory distress.     Breath sounds: Normal breath sounds.  Abdominal:     General: Bowel sounds are normal. There is no distension.     Palpations: Abdomen is soft. There is no mass.     Tenderness: There is no abdominal tenderness. There is no guarding.  Musculoskeletal:     Cervical back: Normal range of motion.  Skin:    General: Skin is warm and dry.     Comments: 1 cm tissue avulsion from the distal tip of the left thumb  Neurological:     Mental Status: She is alert and oriented to person, place, and time.  Psychiatric:        Behavior: Behavior normal.    ED Results / Procedures / Treatments   Labs (all labs ordered are listed, but only abnormal results are displayed) Labs Reviewed - No data to display  EKG None  Radiology No results found.  Procedures Procedures   Medications Ordered in ED Medications  lidocaine-EPINEPHrine-tetracaine (LET) topical gel (3 mLs Topical Given 08/13/21 2133)    ED Course  I have reviewed the triage vital signs and the nursing notes.  Pertinent labs & imaging results that were available during my care of the patient were reviewed by me and considered in my medical decision making (see chart for details).    MDM Rules/Calculators/A&P                           Patient here with tissue avulsion  injury. Tdap is UTD. LET applied and hemostasis improved. Then Wound seal applied with hemostasis. Patient given OP care  and return instructions. Final Clinical Impression(s) / ED Diagnoses Final diagnoses:  Avulsion of finger tip, initial encounter    Rx / DC Orders ED Discharge Orders     None        Margarita Mail, PA-C 08/14/21 1906    Sherwood Gambler, MD 08/15/21 [phone]

## 2021-08-13 NOTE — Discharge Instructions (Signed)
Contact a health care provider if: You have pain that does not get better with medicine. You have any of these signs of infection: More redness, swelling, or pain around your wound. More fluid or blood coming from your wound. A fever. You got a tetanus shot and you have swelling, severe pain, redness, or bleeding at the injection site. You are nauseous or you vomit. You notice something coming out of the wound, such as wood or glass. Get help right away if: You have a red streak going away from your wound. A wound that was closed breaks open. The wound is bleeding, and the bleeding does not stop with gentle pressure. You have trouble breathing. The wound is on your hand or foot and: You cannot properly move a finger or toe. Your fingers or toes look pale or bluis

## 2021-09-11 ENCOUNTER — Ambulatory Visit (INDEPENDENT_AMBULATORY_CARE_PROVIDER_SITE_OTHER): Payer: Federal, State, Local not specified - PPO | Admitting: Family

## 2021-09-11 ENCOUNTER — Encounter: Payer: Self-pay | Admitting: Family

## 2021-09-11 VITALS — BP 137/87 | HR 71 | Temp 98.4°F | Resp 16 | Wt 124.0 lb

## 2021-09-11 DIAGNOSIS — Z23 Encounter for immunization: Secondary | ICD-10-CM | POA: Diagnosis not present

## 2021-09-11 DIAGNOSIS — H04203 Unspecified epiphora, bilateral lacrimal glands: Secondary | ICD-10-CM | POA: Diagnosis not present

## 2021-09-11 DIAGNOSIS — Z0000 Encounter for general adult medical examination without abnormal findings: Secondary | ICD-10-CM

## 2021-09-11 DIAGNOSIS — K5090 Crohn's disease, unspecified, without complications: Secondary | ICD-10-CM

## 2021-09-11 MED ORDER — CALTRATE 600+D PLUS MINERALS 600-800 MG-UNIT PO TABS: ORAL_TABLET | ORAL | Status: AC

## 2021-09-11 NOTE — Progress Notes (Signed)
Subjective:   By signing my name below, I, Janice Zuniga, attest that this documentation has been prepared under the direction and in the presence of Janice Alar, NP, 09/11/2021    Patient ID: Janice Zuniga, female    DOB: 1971-07-30, 50 y.o.   MRN: [id number]  Chief Complaint  Patient presents with   Annual Exam    HPI Patient is in today for a comprehensive physical exam.  She denies any fever, unexpected weight change, adenopathy, rash, hearing loss, ear pain, rhinorrhea, visual disturbances, eye pain, chest pain, leg swelling, cough, nausea, vomitting, diarrhea, blood in stool, dysuria, frequency, myalgias, arthralgias, headaches, depression or anxiety.  Eye watering: She notes that her right eye waters consistently. She has tried to stop wearing makeup to see if this helps, used eye drops, and taken allergy medication and nothing has relieved it from watering. She does not mention any pain, irritation, burning or itching in the eye. She does not have a current eye doctor at this time. Wrist pain: She has mild pain in her wrist upon and after impaction such as push ups. She notes she will aggravate it then it will hurt for a few days beyond the initial aggravation. She had a bone density performed at [facility] and it showed she was osteopenic. She is on a bid calcium supplement with vit D.  Immunizations: She is UTD on tetanus. She has received her pneumonia shot and the booster. She has had her flu shot and 3 Covid-19 vaccines at this time. She is interested in getting the bivalent Covid-19 booster at a later date. She has not received her Shingrix vaccine at this time.  Diet: She reports she maintains a fairly healthy diet but she is not consistent with it.  Exercise: She notes that she is on and off with consistent exercise.  Colonoscopy: Last performed on 06/28/2016 and results found a single ulceration in the terminal ileum. Inflammation characterized by erythema,  friability and deep ulcerations were also found in a patchy distribution in the proximal transverse and right colon. Non-bleeding internal hemorrhoids were found and the colon was tortuous. She was diagnosed with Crohn's after this exam.  Pap Smear: No previous procedures on record. Due.  Mammogram: Last performed on 01/04/2021 and results were normal. Repeat after one year.  Alcohol: She drinks about a glass of wine everyday during the holiday season but about 5 days a week during the rest of the year.  Drugs: She does not use drugs. Tobacco: She does not use any tobacco or vaping products. Vision: She is not UTD on vision care. SHx: She has not had any surgeries in the last year.  FMHx: She reports no new changes in her family medical history.    Health Maintenance Due  Topic Date Due   Hepatitis C Screening  Never done   COLONOSCOPY (Pts 45-78yr Insurance coverage will need to be confirmed)  06/28/2017   COVID-19 Vaccine (5 - Booster) 07/18/2020    Past Medical History:  Diagnosis Date   Abnormal CT of the abdomen    Abnormal uterine bleeding    Anemia    iron def   Ankylosing spondylitis (HCC)    Chicken pox    Colitis    Cro[place] disease [place]    Dysrhythmia    occassional fluttering noted by patient   Genital warts    Gluten intolerance    History of chlamydia    History of hysterectomy 08/21/15   HLA B27 (HLA B27  positive)    HSV-1 (herpes simplex virus 1) infection    Low calcium levels    Low iron    Low serum vitamin D    Muscle spasms of both lower extremities    Spasms in lower back   Non-celiac gluten sensitivity    Reactive airway disease    Spondyloarthritis    genetic marker   UTI (lower urinary tract infection)     Past Surgical History:  Procedure Laterality Date   ABDOMINAL HYSTERECTOMY  08/21/15   BILATERAL SALPINGECTOMY Bilateral 08/21/2015   Procedure: BILATERAL SALPINGECTOMY;  Surgeon: Nunzio Cobbs, MD;  Location: [facility];  Service:  Gynecology;  Laterality: Bilateral;   COLONOSCOPY     CYSTOSCOPY N/A 08/21/2015   Procedure: CYSTOSCOPY;  Surgeon: Nunzio Cobbs, MD;  Location: [facility];  Service: Gynecology;  Laterality: N/A;   MOUTH SURGERY     cyst in mouth   ROBOTIC ASSISTED TOTAL HYSTERECTOMY WITH SALPINGECTOMY Bilateral 08/21/2015   Procedure: ROBOTIC ASSISTED TOTAL HYSTERECTOMY ;  Surgeon: Nunzio Cobbs, MD;  Location: [facility];  Service: Gynecology;  Laterality: Bilateral;   WISDOM TOOTH EXTRACTION      Family History  Problem Relation Age of Onset   Hypertension Mother    Diabetes Mother    Anxiety disorder Mother    Diabetes Father    Colon polyps Father    Prostate cancer Father    Cancer Paternal Grandmother        ? type    Social History   Socioeconomic History   Marital status: Married    Spouse name: Not on file   Number of children: Not on file   Years of education: Not on file   Highest education level: Not on file  Occupational History   Not on file  Tobacco Use   Smoking status: Never   Smokeless tobacco: Never  Vaping Use   Vaping Use: Never used  Substance and Sexual Activity   Alcohol use: Yes    Alcohol/week: 4.0 - 5.0 standard drinks    Types: 4 - 5 Glasses of wine per week   Drug use: No   Sexual activity: Yes    Partners: Male    Comment: Hyst  Other Topics Concern   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 [name]. [name] 03' Zuniga 05'.    Got 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     HUMIRA PEN 40 MG/0.4ML PNKT SMARTSIG:40 Milligram(s) SUB-Q Every 2 Weeks     methotrexate (RHEUMATREX) 2.5 MG tablet Take 2.5 mg by mouth once a week. Caution:Chemotherapy. Protect from light.     valACYclovir (VALTREX) 500 MG tablet Take one tab (500 mg) PO bid x 3 days prn for genital outbreak.  Take  4 tabs (2000 mg) orally every 12 hours for 24 hours for oral outbreak. 30 tablet 2   estradiol (ESTRACE) 0.1 MG/GM vaginal cream Use 1/2 g vaginally every night for the first 2 weeks, then use 1/2 g vaginally two or three times per week as needed to maintain symptom relief. 42.5 g 0   No facility-administered medications prior to visit.    Allergies  Allergen Reactions   Gluten Meal Other (See Comments)    Auto immune   Tramadol     nausea   Codeine Nausea And Vomiting   Peanut-Containing Drug Products Other (See Comments)    By allergy test...has eaten peanuts without issues     Review of Systems  Constitutional:  Negative for fever.       (-) unexpected weight changes (-) adenopathy  HENT:  Negative for ear pain and hearing loss.        (-) rhinorrhea  Eyes:  Negative for pain.       (-) visual disturbances (+) right eye watering  Respiratory:  Negative for cough.   Cardiovascular:  Negative for chest pain and leg swelling.  Gastrointestinal:  Negative for blood in stool, diarrhea, nausea and vomiting.  Genitourinary:  Negative for dysuria and frequency.  Musculoskeletal:  Negative for joint pain and myalgias.  Skin:  Negative for rash.  Neurological:  Negative for headaches.  Psychiatric/Behavioral:  Negative for depression. The patient is not nervous/anxious.       Objective:    Physical Exam Constitutional:      General: She is not in acute distress.    Appearance: Normal appearance. She is not ill-appearing.  HENT:      Head: Normocephalic and atraumatic.     Right Ear: Tympanic membrane, ear canal and external ear normal.     Left Ear: Tympanic membrane, ear canal and external ear normal.  Eyes:     Extraocular Movements: Extraocular movements intact.     Pupils: Pupils are equal, round, and reactive to light.     Comments: (-) nystagmus  Cardiovascular:     Rate and Rhythm: Normal rate and regular rhythm.     Heart sounds: Normal heart sounds. No murmur heard.   No gallop.  Pulmonary:     Effort: Pulmonary effort is normal. No respiratory distress.     Breath sounds: Normal breath sounds. No wheezing or rales.  Abdominal:     General: Bowel sounds are normal.     Palpations: Abdomen is soft.     Tenderness: There is no abdominal tenderness.  Musculoskeletal:     Comments: (+) 5/5 upper and lower extremity strength  Lymphadenopathy:     Cervical: No cervical adenopathy.  Skin:    General: Skin is warm and dry.  Neurological:     Mental Status: She is alert and oriented to person, place, and time.     Deep Tendon Reflexes:     Reflex Scores:      Patellar reflexes are 2+ on the right side and 2+ on the left side. Psychiatric:        Behavior: Behavior normal.        Judgment: Judgment normal.    BP 137/87 (BP Location: Right Arm, Patient Position: Sitting, Cuff Size: Small)    Pulse 71    Temp 98.4 F (36.9 C) (Oral)    Resp 16    Wt 124 lb (56.2 kg)    LMP 08/07/2015    SpO2 100%    BMI  21.62 kg/m  Wt Readings from Last 3 Encounters:  09/11/21 124 lb (56.2 kg)  06/18/21 124 lb (56.2 kg)  05/22/21 123 lb 12.8 oz (56.2 kg)       Assessment & Plan:   Problem List Items Addressed This Visit       Unprioritized   Preventative health care - Primary    Continue efforts with healthy diet and regular exercise.   Mammogram will be due this spring. Refer back to GI for follow up and repeat colonoscopy. S/p hysterectomy- no pap needed.  Recommended that she see ophthalmology and schedule  follow up dental visit. Recommended covid bivalent booster at the pharmacy and shingrix #1 today.       Relevant Orders   Lipid panel   Crohn disease ([place])   Relevant Orders   Ambulatory referral to Gastroenterology   Other Visit Diagnoses     Watery eyes       Relevant Orders   Ambulatory referral to Ophthalmology   Need for shingles vaccine       Relevant Orders   Varicella-zoster vaccine IM (Shingrix) (Completed)      Meds ordered this encounter  Medications   Calcium Carbonate-Vit D-Min (CALTRATE 600+D PLUS MINERALS) 600-800 MG-UNIT TABS    Sig: One tab twice daily    Order Specific Question:   Supervising Provider    Answer:   Penni Homans A [[id number]]    I, Janice Alar, NP, personally preformed the services described in this documentation.  All medical record entries made by the scribe were at my direction and in my presence.  I have reviewed the chart and discharge instructions (if applicable) and agree that the record reflects my personal performance and is accurate and complete. 09/11/2021  I,Janice Zuniga,acting as a Education administrator for Nance Pear, NP.,have documented all relevant documentation on the behalf of Nance Pear, NP,as directed by  Nance Pear, NP while in the presence of Nance Pear, NP.  Nance Pear, NP

## 2021-09-11 NOTE — Assessment & Plan Note (Addendum)
Continue efforts with healthy diet and regular exercise.   Mammogram will be due this spring. Refer back to GI for follow up and repeat colonoscopy. S/p hysterectomy- no pap needed.  Recommended that she see ophthalmology and schedule follow up dental visit. Recommended covid bivalent booster at the pharmacy and shingrix #1 today.

## 2021-09-11 NOTE — Patient Instructions (Addendum)
Please stop by the lab to have updated blood work performed.  Plan to get your bivalent Covid-19 vaccine at your earliest convenience.  Please wait for gastroenterology to contact you to schedule an appointment for colonoscopy. Please wait for opthalmology to reach out to you to schedule an appointment regarding your eye concerns.  Keep in mind your mammogram will be due in April 2023.

## 2021-09-20 ENCOUNTER — Telehealth: Payer: Self-pay

## 2021-09-20 ENCOUNTER — Other Ambulatory Visit: Payer: Self-pay

## 2021-09-20 MED ORDER — NITROFURANTOIN MACROCRYSTAL 50 MG PO CAPS: ORAL_CAPSULE | ORAL | 1 refills | Status: DC

## 2021-09-20 NOTE — Telephone Encounter (Signed)
I spoke with patient and advised her. Medication instructions reviewed.  Rx sent.

## 2021-09-20 NOTE — Telephone Encounter (Signed)
Dr. Quincy Simmonds,  There is a drug interaction warning with the Bactrim DS and Methotre

## 2021-09-20 NOTE — Telephone Encounter (Signed)
Patient called in voice mail stating she needs refill on Nitrofuratoin 50 mg (for post coital uti prevention). She has not had any and has not been taking it for some time.   Currently, she is concerned because "we are going into weekend" and slight pressure and "odd feeling at end of urination". "Just doesn't feel right, something going on down there and not sure how it is going to manifest."  No burning, frequency or fever. Just a feeling that something is not right.

## 2021-09-20 NOTE — Telephone Encounter (Signed)
Thank you.  Please discontinue any order for Bactrim DS.  I checked on Methotrexate and Nitrofurantoin.  These two medications are ok together.   For her current potential infection, she can take Nitrofurantoin 50 mg, take two tablets by mouth twice a day for 5 days.   Then she take take one for with intercourse as needed.

## 2021-09-20 NOTE — Telephone Encounter (Signed)
Ok for Bactrim DS po bid x 3 days to treat potential UTI.  She can take over the counter AZO standard 100 mg po tid prn x 2 days for any burning with urination if this occurs.  Ok for Nitrofurantoin 50 mg po x 1 with intercourse.  Disp:  30 RF:  one

## 2021-09-20 NOTE — Telephone Encounter (Signed)
Dr. Quincy Simmonds,  There is a drug interaction warning with the Bactrim DS and Methotrexate. I did confirm with patient she is taking the Methotrexate currently.  Please advise. Thanks

## 2021-09-21 DIAGNOSIS — H20041 Secondary noninfectious iridocyclitis, right eye: Secondary | ICD-10-CM | POA: Diagnosis not present

## 2021-09-21 DIAGNOSIS — H04221 Epiphora due to insufficient drainage, right lacrimal gland: Secondary | ICD-10-CM | POA: Diagnosis not present

## 2021-09-25 ENCOUNTER — Other Ambulatory Visit: Payer: Self-pay

## 2021-09-25 NOTE — Telephone Encounter (Signed)
Received paper Rx refill on pt's preventative macrobid Rx from [facility] on [address]. However, we just approved an Rx for 30 caps with 1RF [facility]'s [facility] on [facility]20/21.   No answer from pt and mailbox full.

## 2021-10-04 ENCOUNTER — Other Ambulatory Visit: Payer: Self-pay

## 2021-10-24 DIAGNOSIS — M450 Ankylosing spondylitis of multiple sites in spine: Secondary | ICD-10-CM | POA: Diagnosis not present

## 2021-10-24 DIAGNOSIS — Z1589 Genetic susceptibility to other disease: Secondary | ICD-10-CM | POA: Diagnosis not present

## 2021-10-24 DIAGNOSIS — Z79899 Other long term (current) drug therapy: Secondary | ICD-10-CM | POA: Diagnosis not present

## 2021-10-24 DIAGNOSIS — M8589 Other specified disorders of bone density and structure, multiple sites: Secondary | ICD-10-CM | POA: Diagnosis not present

## 2021-11-08 ENCOUNTER — Telehealth: Payer: Self-pay | Admitting: *Deleted

## 2021-11-08 MED ORDER — ESTRADIOL 0.1 MG/GM VA CREA: TOPICAL_CREAM | VAGINAL | 0 refills | Status: DC

## 2021-11-08 NOTE — Telephone Encounter (Signed)
Ok for vaginal estradiol cream 0.01% Place 1/2 gram pv at hs nightly for 2 weeks.  Then place 1/2 gram pv at hs two to three nights per week.  Disp:  42.5 gram RF:  none.

## 2021-11-08 NOTE — Telephone Encounter (Signed)
Patient was prescribed estradiol (estrace)  vaginal cream in 09/2020, however patient never picked up Rx. Patient said she is feeling more vaginal dryness now and would like to start on cream. Her annual exam is due in April, she has not yet scheduled. If you agree she can start on cream she will need a new Rx, as the other Rx has expired.   Please advise

## 2021-11-08 NOTE — Telephone Encounter (Signed)
Patient informed, Rx sent.

## 2021-11-09 ENCOUNTER — Ambulatory Visit (INDEPENDENT_AMBULATORY_CARE_PROVIDER_SITE_OTHER): Payer: Federal, State, Local not specified - PPO

## 2021-11-09 DIAGNOSIS — Z23 Encounter for immunization: Secondary | ICD-10-CM | POA: Diagnosis not present

## 2021-11-09 NOTE — Progress Notes (Signed)
51 yo female is here today for 2nd shingles vaccine Pt was given 0.5 ml if shingrix in right deltoid. Pt tolerated well

## 2021-11-14 ENCOUNTER — Ambulatory Visit: Payer: Federal, State, Local not specified - PPO

## 2021-11-20 DIAGNOSIS — H02834 Dermatochalasis of left upper eyelid: Secondary | ICD-10-CM | POA: Diagnosis not present

## 2021-11-20 DIAGNOSIS — H57813 Brow ptosis, bilateral: Secondary | ICD-10-CM | POA: Diagnosis not present

## 2021-11-20 DIAGNOSIS — H04221 Epiphora due to insufficient drainage, right lacrimal gland: Secondary | ICD-10-CM | POA: Diagnosis not present

## 2021-11-20 DIAGNOSIS — H02831 Dermatochalasis of right upper eyelid: Secondary | ICD-10-CM | POA: Diagnosis not present

## 2021-11-20 DIAGNOSIS — H04563 Stenosis of bilateral lacrimal punctum: Secondary | ICD-10-CM | POA: Diagnosis not present

## 2021-11-20 DIAGNOSIS — D2239 Melanocytic nevi of other parts of face: Secondary | ICD-10-CM | POA: Diagnosis not present

## 2021-12-10 ENCOUNTER — Other Ambulatory Visit: Payer: Self-pay | Admitting: Family

## 2021-12-14 DIAGNOSIS — H04563 Stenosis of bilateral lacrimal punctum: Secondary | ICD-10-CM | POA: Diagnosis not present

## 2022-01-03 ENCOUNTER — Other Ambulatory Visit: Payer: Self-pay

## 2022-01-04 ENCOUNTER — Other Ambulatory Visit: Payer: Self-pay

## 2022-01-09 ENCOUNTER — Other Ambulatory Visit: Payer: Self-pay

## 2022-01-11 ENCOUNTER — Other Ambulatory Visit: Payer: Self-pay | Admitting: *Deleted

## 2022-01-15 ENCOUNTER — Other Ambulatory Visit: Payer: Self-pay

## 2022-04-03 DIAGNOSIS — M450 Ankylosing spondylitis of multiple sites in spine: Secondary | ICD-10-CM | POA: Diagnosis not present

## 2022-04-12 DIAGNOSIS — Z79899 Other long term (current) drug therapy: Secondary | ICD-10-CM | POA: Diagnosis not present

## 2022-04-12 DIAGNOSIS — Z0189 Encounter for other specified special examinations: Secondary | ICD-10-CM | POA: Diagnosis not present

## 2022-04-30 ENCOUNTER — Other Ambulatory Visit (HOSPITAL_BASED_OUTPATIENT_CLINIC_OR_DEPARTMENT_OTHER): Payer: Self-pay | Admitting: Family

## 2022-04-30 ENCOUNTER — Telehealth (HOSPITAL_BASED_OUTPATIENT_CLINIC_OR_DEPARTMENT_OTHER): Payer: Self-pay

## 2022-05-02 NOTE — Progress Notes (Signed)
51 y.o. [id number] Married [place] American female here for annual exam.    Using Valtrex prn.   Not using vaginal estrogen cream.  Does not feel she needs it.   Not using the nitrofurantoin  for the last few months.  Not having issues with post coital UTI.   Planted fruit trees at home.   PCP:  [name], NP   Patient's last menstrual period was 08/07/2015.           Sexually active: Yes.    The current method of family planning is status post hysterectomy.    Exercising: Yes.     Weights, yoga, pilates, marshal arts Smoker:  no  Health Maintenance: Pap:  03-31-15 Neg:Neg HR HPV;  2014 neg History of abnormal Pap:  no MMG:   02-12-21 Diag.Lt4-14-22 Neg/Birads1--she will call to schedule Colonoscopy:  06-08-15 active chronic colitis --[facility] BMD:  07-02-21  Result  [facility] rheumatology.  TDaP:  11-08-19 Gardasil:   no HIV: ~2017 Neg Hep C: 2017 Neg Screening Labs:  PCP   reports that she has never smoked. She has never used smokeless tobacco. She reports current alcohol use of about 4.0 - 5.0 standard drinks of alcohol per week. She reports that she does not use drugs.  Past Medical History:  Diagnosis Date   Abnormal CT of the abdomen    Abnormal uterine bleeding    Anemia    iron def   Ankylosing spondylitis (HCC)    Chicken pox    Colitis    Crohn disease ([place])    Dysrhythmia    occassional fluttering noted by patient   Genital warts    Gluten intolerance    History of chlamydia    History of hysterectomy 08/21/15   HLA B27 (HLA B27 positive)    HSV-1 (herpes simplex virus 1) infection    Low calcium levels    Low iron    Low serum vitamin D    Muscle spasms of both lower extremities    Spasms in lower back   Non-celiac gluten sensitivity    Reactive airway disease    Spondyloarthritis    genetic marker   UTI (lower urinary tract infection)     Past Surgical History:  Procedure Laterality Date   ABDOMINAL HYSTERECTOMY  08/21/15    BILATERAL SALPINGECTOMY Bilateral 08/21/2015   Procedure: BILATERAL SALPINGECTOMY;  Surgeon: Nunzio Cobbs, MD;  Location: [facility];  Service: Gynecology;  Laterality: Bilateral;   COLONOSCOPY     CYSTOSCOPY N/A 08/21/2015   Procedure: CYSTOSCOPY;  Surgeon: Nunzio Cobbs, MD;  Location: [facility];  Service: Gynecology;  Laterality: N/A;   MOUTH SURGERY     cyst in mouth   ROBOTIC ASSISTED TOTAL HYSTERECTOMY WITH SALPINGECTOMY Bilateral 08/21/2015   Procedure: ROBOTIC ASSISTED TOTAL HYSTERECTOMY ;  Surgeon: Nunzio Cobbs, MD;  Location: [facility];  Service: Gynecology;  Laterality: Bilateral;   WISDOM TOOTH EXTRACTION      Current Outpatient Medications  Medication Sig Dispense Refill   Adalimumab 40 MG/0.4ML PNKT Inject into the skin.     Calcium Carbonate-Vit D-Min (CALTRATE 600+D PLUS MINERALS) 600-800 MG-UNIT TABS One tab twice daily     folic acid (FOLVITE) 1 MG tablet Take 1 tablet by mouth daily.     methotrexate (RHEUMATREX) 2.5 MG tablet Take by mouth.     nitrofurantoin (MACRODANTIN) 50 MG capsule Take two caps (168m) po bid x 5 days. Then take one cap po post  coitally for prevention. 30 capsule 1   valACYclovir (VALTREX) 500 MG tablet Take one tab (500 mg) PO bid x 3 days prn for genital outbreak.  Take 4 tabs (2000 mg) orally every 12 hours for 24 hours for oral outbreak. 30 tablet 2   estradiol (ESTRACE VAGINAL) 0.1 MG/GM vaginal cream Insert 1/2 gram at bedtime vaginally nightly for 2 weeks, then 1/2 gram at bedtime night for 2-3 times per week. (Patient not taking: Reported on 05/06/2022) 42.5 g 0   No current facility-administered medications for this visit.    Family History  Problem Relation Age of Onset   Hypertension Mother    Diabetes Mother    Anxiety disorder Mother    Diabetes Father    Colon polyps Father    Prostate cancer Father    Cancer Paternal Grandmother        ? type    Review of Systems  All other systems reviewed and are  negative.   Exam:   BP 110/70   Pulse 64   Ht 5' 2.5" (1.588 m)   Wt 120 lb (54.4 kg)   LMP 08/07/2015   SpO2 98%   BMI 21.60 kg/m     General appearance: alert, cooperative and appears stated age Head: normocephalic, without obvious abnormality, atraumatic Neck: no adenopathy, supple, symmetrical, trachea midline and thyroid normal to inspection and palpation Lungs: clear to auscultation bilaterally Breasts: right - normal appearance, no masses or tenderness, No nipple retraction or dimpling, No nipple discharge or bleeding, No axillary adenopathy Left - normal appearnace, 3 cm mass at 2:00 which is nontender, No nipple retraction or dimpling, No nipple discharge or bleeding, No axillary adenopathy Heart: regular rate and rhythm Abdomen: soft, non-tender; no masses, no organomegaly Extremities: extremities normal, atraumatic, no cyanosis or edema Skin: skin color, texture, turgor normal. No rashes or lesions Lymph nodes: cervical, supraclavicular, and axillary nodes normal. Neurologic: grossly normal  Pelvic: External genitalia:  no lesions              No abnormal inguinal nodes palpated.              Urethra:  normal appearing urethra with no masses, tenderness or lesions              Bartholins and Skenes: normal                 Vagina: normal appearing vagina with normal color and discharge, no lesions              Cervix: absent              Pap taken: no Bimanual Exam:  Uterus: absent              Adnexa: no mass, fullness, tenderness              Rectal exam: yes.  Confirms.              Anus:  normal sphincter tone, no lesions  Chaperone was present for exam:  Estill Bamberg, CMA  Assessment:   Well woman visit with gynecologic exam. Status post robotic TLH, bilateral salpingectomy.  HSV.  Oral and genital. Left breast mass.  Crohn's. On Humira.  Post coital UTIs.  Plan: Dx bilateral mammogram and left breast US at [facility].  Self breast awareness  reviewed. Pap and HR HPV as above. Guidelines for Calcium, Vitamin D, regular exercise program including cardiovascular and weight bearing exercise. Refill of valtrex and Nitrofurantoin.  Labs with PCP and rheumatology.  Follow up annually and prn.   After visit summary provided.

## 2022-05-06 ENCOUNTER — Telehealth: Payer: Self-pay | Admitting: Obstetrics and Gynecology

## 2022-05-06 ENCOUNTER — Ambulatory Visit (INDEPENDENT_AMBULATORY_CARE_PROVIDER_SITE_OTHER): Payer: Federal, State, Local not specified - PPO | Admitting: Obstetrics and Gynecology

## 2022-05-06 ENCOUNTER — Encounter: Payer: Self-pay | Admitting: Obstetrics and Gynecology

## 2022-05-06 VITALS — BP 110/70 | HR 64 | Ht 62.5 in | Wt 120.0 lb

## 2022-05-06 DIAGNOSIS — N6321 Unspecified lump in the left breast, upper outer quadrant: Secondary | ICD-10-CM | POA: Diagnosis not present

## 2022-05-06 DIAGNOSIS — Z01419 Encounter for gynecological examination (general) (routine) without abnormal findings: Secondary | ICD-10-CM

## 2022-05-06 DIAGNOSIS — N6320 Unspecified lump in the left breast, unspecified quadrant: Secondary | ICD-10-CM

## 2022-05-06 MED ORDER — VALACYCLOVIR HCL 500 MG PO TABS: ORAL_TABLET | ORAL | 3 refills | Status: DC

## 2022-05-06 MED ORDER — NITROFURANTOIN MACROCRYSTAL 50 MG PO CAPS
ORAL_CAPSULE | ORAL | 3 refills | Status: DC
Start: 2022-05-06 — End: 2022-09-24

## 2022-05-06 NOTE — Patient Instructions (Signed)

## 2022-05-06 NOTE — Telephone Encounter (Signed)
Please schedule dx bilateral mammogram and left breast US at the [facility] for my patient.  She has a 3 cm left breast mass at 2:00.   This feels like it has gotten larger on physical exam.

## 2022-05-06 NOTE — Telephone Encounter (Addendum)
Order placed. Patient is scheduled for Thursday, 05/30/22 at 8:00am at [facility].  Spoke with patient and informed her.

## 2022-05-07 NOTE — Telephone Encounter (Signed)
Encounter reviewed and closed.  

## 2022-05-30 ENCOUNTER — Ambulatory Visit
Admission: RE | Admit: 2022-05-30 | Discharge: 2022-05-30 | Disposition: A | Discharge status: Home / Self Care | Payer: Federal, State, Local not specified - PPO | Source: Ambulatory Visit | Attending: Obstetrics and Gynecology | Admitting: Obstetrics and Gynecology

## 2022-05-30 DIAGNOSIS — R922 Inconclusive mammogram: Secondary | ICD-10-CM | POA: Diagnosis not present

## 2022-05-30 DIAGNOSIS — N6320 Unspecified lump in the left breast, unspecified quadrant: Secondary | ICD-10-CM

## 2022-05-30 DIAGNOSIS — N6002 Solitary cyst of left breast: Secondary | ICD-10-CM | POA: Diagnosis not present

## 2022-07-01 DIAGNOSIS — Z23 Encounter for immunization: Secondary | ICD-10-CM | POA: Diagnosis not present

## 2022-07-01 DIAGNOSIS — Z1589 Genetic susceptibility to other disease: Secondary | ICD-10-CM | POA: Diagnosis not present

## 2022-07-01 DIAGNOSIS — M450 Ankylosing spondylitis of multiple sites in spine: Secondary | ICD-10-CM | POA: Diagnosis not present

## 2022-07-01 DIAGNOSIS — E559 Vitamin D deficiency, unspecified: Secondary | ICD-10-CM | POA: Diagnosis not present

## 2022-07-01 DIAGNOSIS — Z79899 Other long term (current) drug therapy: Secondary | ICD-10-CM | POA: Diagnosis not present

## 2022-08-06 ENCOUNTER — Telehealth: Payer: Self-pay

## 2022-08-06 NOTE — Patient Outreach (Signed)
  Care Coordination   08/06/2022 Name: Janice Zuniga MRN: [id number] DOB: [birth year]   Care Coordination Outreach Attempts:  An unsuccessful telephone outreach was attempted today to offer the patient information about available care coordination services as a benefit of their health plan.   Follow Up Plan:  Additional outreach attempts will be made to offer the patient care coordination information and services.   Encounter Outcome:  No Answer  Care Coordination Interventions Activated:  No   Care Coordination Interventions:  No, not indicated    Thea Silversmith, RN, MSN, BSN, [place] Coordinator [phone]

## 2022-08-14 ENCOUNTER — Telehealth: Payer: Self-pay

## 2022-08-14 NOTE — Patient Outreach (Signed)
  Care Coordination   Initial Visit Note   08/14/2022 Name: Janice Zuniga MRN: [id number] DOB: [birth year]  Janice Zuniga is a 51 y.o. year old female who sees Janice Alar, NP for primary care. I spoke with  Janice Zuniga by phone today.  What matters to the patients health and wellness today?  Janice Zuniga request to schedule date/time to complete assessment   SDOH assessments and interventions completed:  No  Care Coordination Interventions Activated:  No  Care Coordination Interventions:  No, not indicated   Follow up plan: Follow up call scheduled for 08/23/22    Encounter Outcome:  Pt. Scheduled   Thea Silversmith, RN, MSN, BSN, [place] Coordinator [phone]

## 2022-08-23 ENCOUNTER — Telehealth: Payer: Self-pay

## 2022-08-23 NOTE — Patient Outreach (Signed)
  Care Coordination   08/23/2022 Name: Paisely Brick MRN: [id number] DOB: [birth year]   Care Coordination Outreach Attempts:  An unsuccessful telephone outreach was attempted today to offer the patient information about available care coordination services as a benefit of their health plan.   Follow Up Plan:  Additional outreach attempts will be made to offer the patient care coordination information and services.   Encounter Outcome:  No Answer   Care Coordination Interventions:  No, not indicated    Thea Silversmith, RN, MSN, BSN, [place] Coordinator [phone]

## 2022-08-30 ENCOUNTER — Telehealth: Payer: Self-pay | Admitting: *Deleted

## 2022-08-30 NOTE — Progress Notes (Signed)
  Care Coordination Note  08/30/2022 Name: Janice Zuniga MRN: [id number] DOB: September 08, 1971  Janice Zuniga is a 51 y.o. year old female who is a primary care patient of [name], NP and is actively engaged with the care management team. I reached out to Currie Paris by phone today to assist with re-scheduling an initial visit with the RN Case Manager  Follow up plan: Unsuccessful telephone outreach attempt made. A HIPAA compliant phone message was left for the patient providing contact information and requesting a return call.   Julian Hy, [place] Direct Dial: [phone]

## 2022-09-13 ENCOUNTER — Ambulatory Visit: Payer: Federal, State, Local not specified - PPO | Admitting: Family

## 2022-09-19 NOTE — Progress Notes (Deleted)
GYNECOLOGY  VISIT   HPI: 51 y.o.   Married  [place] American  female   [phone] with Patient's last menstrual period was 08/07/2015.   here for   menopause  GYNECOLOGIC HISTORY: Patient's last menstrual period was 08/07/2015. Contraception:  hysterectomy Menopausal hormone therapy:  n/a Last mammogram:  05/30/22 Breast Density Category C, BI-RADS CATEGORY 2 Benign Last pap smear:   03-31-15 Neg:Neg HR HPV;  2014 neg         OB History     Gravida  5   Para  3   Term  3   Preterm      AB  2   Living  3      SAB  1   IAB  1   Ectopic      Multiple      Live Births                 Patient Active Problem List   Diagnosis Date Noted   Preventative health care 09/11/2021   Bronchitis 06/18/2021   Menopausal syndrome (hot flashes) 05/22/2021   Night sweats 04/24/2021   Fatigue 02/12/2021   Crohn disease ([place]) 02/18/2017   History of uveitis 02/18/2017   Ankylosing spondylitis of multiple sites in spine ([place]) 01/29/2017   Encounter for long-term (current) use of high-risk medication 01/29/2017   Abnormal loss of weight 11/02/2016   Crohn's disease of both small and large intestine with complication ([place]) 51/76/1607   Elevated ferritin level 11/01/2016   History of colitis 03/29/2016   Elevated serum globulin level 12/29/2015   Depression 09/15/2015   Vitamin D deficiency 09/08/2015   Anemia 09/08/2015   Status post laparoscopic hysterectomy 08/21/2015   Low calcium levels 07/14/2015   Abnormal CT of the abdomen 04/24/2015   Hematochezia 04/24/2015   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/12/2014   Extrinsic asthma 07/12/2014   Iron deficiency anemia 07/11/2014   HLA B27 positive 07/06/2014   Low serum vitamin D    Non-celiac gluten sensitivity    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[place])     Past Medical History:  Diagnosis Date   Abnormal CT of the abdomen    Abnormal uterine bleeding    Anemia    iron def   Ankylosing spondylitis (HCC)    Chicken pox    Colitis     Crohn disease ([place])    Dysrhythmia    occassional fluttering noted by patient   Genital warts    Gluten intolerance    History of chlamydia    History of hysterectomy 08/21/15   HLA B27 (HLA B27 positive)    HSV-1 (herpes simplex virus 1) infection    Low calcium levels    Low iron    Low serum vitamin D    Muscle spasms of both lower extremities    Spasms in lower back   Non-celiac gluten sensitivity    Reactive airway disease    Spondyloarthritis    genetic marker   UTI (lower urinary tract infection)     Past Surgical History:  Procedure Laterality Date   ABDOMINAL HYSTERECTOMY  08/21/15   BILATERAL SALPINGECTOMY Bilateral 08/21/2015   Procedure: BILATERAL SALPINGECTOMY;  Surgeon: Nunzio Cobbs, MD;  Location: [facility];  Service: Gynecology;  Laterality: Bilateral;   COLONOSCOPY     CYSTOSCOPY N/A 08/21/2015   Procedure: CYSTOSCOPY;  Surgeon: Nunzio Cobbs, MD;  Location: [facility];  Service: Gynecology;  Laterality: N/A;   MOUTH SURGERY  cyst in mouth   ROBOTIC ASSISTED TOTAL HYSTERECTOMY WITH SALPINGECTOMY Bilateral 08/21/2015   Procedure: ROBOTIC ASSISTED TOTAL HYSTERECTOMY ;  Surgeon: Nunzio Cobbs, MD;  Location: [facility];  Service: Gynecology;  Laterality: Bilateral;   WISDOM TOOTH EXTRACTION      Current Outpatient Medications  Medication Sig Dispense Refill   Adalimumab 40 MG/0.4ML PNKT Inject into the skin.     Calcium Carbonate-Vit D-Min (CALTRATE 600+D PLUS MINERALS) 600-800 MG-UNIT TABS One tab twice daily     folic acid (FOLVITE) 1 MG tablet Take 1 tablet by mouth daily.     methotrexate (RHEUMATREX) 2.5 MG tablet Take by mouth.     nitrofurantoin (MACRODANTIN) 50 MG capsule Then take one cap po post coitally for prevention. 30 capsule 3   valACYclovir (VALTREX) 500 MG tablet Take one tab (500 mg) PO bid x 3 days prn for genital outbreak.  Take 4 tabs (2000 mg) orally every 12 hours for 24 hours for oral outbreak. 30 tablet 3    No current facility-administered medications for this visit.     ALLERGIES: Gluten meal, Tramadol, Codeine, and Peanut-containing drug products  Family History  Problem Relation Age of Onset   Hypertension Mother    Diabetes Mother    Anxiety disorder Mother    Diabetes Father    Colon polyps Father    Prostate cancer Father    Cancer Paternal Grandmother        ? type    Social History   Socioeconomic History   Marital status: Married    Spouse name: Not on file   Number of children: Not on file   Years of education: Not on file   Highest education level: Not on file  Occupational History   Not on file  Tobacco Use   Smoking status: Never   Smokeless tobacco: Never  Vaping Use   Vaping Use: Never used  Substance and Sexual Activity   Alcohol use: Yes    Alcohol/week: 4.0 - 5.0 standard drinks of alcohol    Types: 4 - 5 Glasses of wine per week   Drug use: No   Sexual activity: Yes    Partners: Male    Comment: Hyst  Other Topics Concern   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 [name]. [name].    Got 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      Hobbies: rest, reading, cooking, acting, piano, kids   Social Determinants of Health   Financial Resource Strain: Not on file  Food Insecurity: Not on file  Transportation Needs: Not on file  Physical Activity: Not on file  Stress: Not on file  Social Connections: Not on file  Intimate Partner Violence: Not on file    Review of Systems  PHYSICAL EXAMINATION:    LMP 08/07/2015     General appearance: alert, cooperative and appears stated age Head: Normocephalic, without obvious abnormality, atraumatic Neck: no adenopathy, supple, symmetrical, trachea midline and thyroid normal to inspection and palpation Lungs: clear to auscultation  bilaterally Breasts: normal appearance, no masses or tenderness, No nipple retraction or dimpling, No nipple discharge or bleeding, No axillary or supraclavicular adenopathy Heart: regular rate and rhythm Abdomen: soft, non-tender, no masses,  no organomegaly Extremities: extremities normal, atraumatic, no cyanosis or edema Skin:  Skin color, texture, turgor normal. No rashes or lesions Lymph nodes: Cervical, supraclavicular, and axillary nodes normal. No abnormal inguinal nodes palpated Neurologic: Grossly normal  Pelvic: External genitalia:  no lesions              Urethra:  normal appearing urethra with no masses, tenderness or lesions              Bartholins and Skenes: normal                 Vagina: normal appearing vagina with normal color and discharge, no lesions              Cervix: no lesions                Bimanual Exam:  Uterus:  normal size, contour, position, consistency, mobility, non-tender              Adnexa: no mass, fullness, tenderness              Rectal exam: {yes no:314532}.  Confirms.              Anus:  normal sphincter tone, no lesions  Chaperone was present for exam:  ***  ASSESSMENT     PLAN     An After Visit Summary was printed and given to the patient.  ______ minutes face to face time of which over 50% was spent in counseling.

## 2022-09-24 ENCOUNTER — Ambulatory Visit (INDEPENDENT_AMBULATORY_CARE_PROVIDER_SITE_OTHER): Payer: Federal, State, Local not specified - PPO | Admitting: Family

## 2022-09-24 ENCOUNTER — Encounter: Payer: Self-pay | Admitting: Family

## 2022-09-24 VITALS — BP 112/81 | HR 72 | Temp 98.0°F | Resp 16 | Ht 62.0 in | Wt 118.0 lb

## 2022-09-24 DIAGNOSIS — R7989 Other specified abnormal findings of blood chemistry: Secondary | ICD-10-CM

## 2022-09-24 DIAGNOSIS — N951 Menopausal and female climacteric states: Secondary | ICD-10-CM | POA: Diagnosis not present

## 2022-09-24 DIAGNOSIS — Z8719 Personal history of other diseases of the digestive system: Secondary | ICD-10-CM

## 2022-09-24 DIAGNOSIS — Z0000 Encounter for general adult medical examination without abnormal findings: Secondary | ICD-10-CM | POA: Diagnosis not present

## 2022-09-24 DIAGNOSIS — Z23 Encounter for immunization: Secondary | ICD-10-CM

## 2022-09-24 LAB — COMPREHENSIVE METABOLIC PANEL
ALT: 13 U/L (ref 0–35)
AST: 16 U/L (ref 0–37)
Albumin: 4 g/dL (ref 3.5–5.2)
Alkaline Phosphatase: 63 U/L (ref 39–117)
BUN: 13 mg/dL (ref 6–23)
CO2: 28 mEq/L (ref 19–32)
Calcium: 9.4 mg/dL (ref 8.4–10.5)
Chloride: 104 mEq/L (ref 96–112)
Creatinine, Ser: 0.69 mg/dL (ref 0.40–1.20)
GFR: 100 mL/min (ref >60.00)
Glucose, Bld: 84 mg/dL (ref 70–99)
Potassium: 4.2 mEq/L (ref 3.5–5.1)
Sodium: 141 mEq/L (ref 135–145)
Total Bilirubin: 0.6 mg/dL (ref 0.2–1.2)
Total Protein: 6.5 g/dL (ref 6.0–8.3)

## 2022-09-24 LAB — LIPID PANEL
Cholesterol: 253 mg/dL — ABNORMAL HIGH (ref 0–200)
HDL: 66.9 mg/dL (ref >39.00)
LDL Cholesterol: 168 mg/dL — ABNORMAL HIGH (ref 0–99)
NonHDL: 186.3
Total CHOL/HDL Ratio: 4
Triglycerides: 93 mg/dL (ref 0.0–149.0)
VLDL: 18.6 mg/dL (ref 0.0–40.0)

## 2022-09-24 LAB — TSH: TSH: 0.61 u[IU]/mL (ref 0.35–5.50)

## 2022-09-24 NOTE — Assessment & Plan Note (Signed)
Continue healthy diet, exercise.  Flu shot today. Encouraged her to get a covid booster at her pharmacy.  Mammogram up to date- due for colo, see discussion re: scheduling.  S/p hysterectomy.

## 2022-09-24 NOTE — Assessment & Plan Note (Signed)
Had normal vitamin D back in October at [facility].

## 2022-09-24 NOTE — Assessment & Plan Note (Signed)
She is due for follow up colonoscopy. She has a specific provider outside the system that she would like to see. She will contact me via mychart with that information.

## 2022-09-24 NOTE — Assessment & Plan Note (Signed)
Clinically stable- being managed by rheumatology at [facility].

## 2022-09-24 NOTE — Progress Notes (Signed)
Subjective:   By signing my name below, I, Janice Zuniga, attest that this documentation has been prepared under the direction and in the presence of Janice Alar, NP. 09/24/2022   Patient ID: Janice Zuniga, female    DOB: Jun 06, 1971, 52 y.o.   MRN: [id number]  Chief Complaint  Patient presents with   Annual Exam    HPI Patient is in today for a comprehensive physical exam.   Hot flashes: She complains of hot flashes. She has an upcoming appointment with her GYN specialist on 10/01/2021. She had a hysterectomy in 2016. She reports receiving blood work and finding she was starting post menopause.   Acute: She denies having any unexpected weight change, ear pain, hearing loss and rhinorrhea, visual disturbance, cough, chest pain and leg swelling, nausea, vomiting, diarrhea, constipation, blood in stool, or dysuria and frequency, for myalgias and arthralgias, rash, headaches, adenopathy, current concerns of depression or anxiety at this time.  Social history: She has no changes to her family medical history. She drinks around 6-7 drinks weekly and is trying to cut back to 5-6 drinks. She does not use tobacco products. She does not use drugs.   Immunizations: She is interested in receiving the flu vaccine during this visit.   Diet: She is managing a healthy diet. She trying to take more fiber and taking in vitamins more regularly.   Colonoscopy: Last completed 06/28/2016. Results showed: - A single ulcer in the terminal ileum. Biopsied, concerning for Crohns ileitis - Inflammation was found in the right colon consistent with Crohns colitis. This was moderate in severity. Biopsied. - Non-bleeding internal hemorrhoids. - Tortuous colon. - The examination was otherwise normal.  Mammogram: Last completed 05/30/2022. Results showed Interval increase in size in patient's 2.9 cm simple cyst over the 2 o'clock position of the left breast correlating to her palpable abnormality.  Otherwise, unremarkable mammogram. Recommend continued annual bilateral screening mammographic follow-up. Patient was informed that she may undergo elective cyst aspiration in the future if desired for symptomatic relief.  Dental: She is due for dental care.   Vision: She has an upcoming eye doctor appointment this Friday.    Past Medical History:  Diagnosis Date   Abnormal CT of the abdomen    Abnormal uterine bleeding    Anemia    iron def   Ankylosing spondylitis (HCC)    Chicken pox    Colitis    Crohn disease ([place])    Dysrhythmia    occassional fluttering noted by patient   Genital warts    Gluten intolerance    History of chlamydia    History of hysterectomy 08/21/15   HLA B27 (HLA B27 positive)    HSV-1 (herpes simplex virus 1) infection    Low calcium levels    Low iron    Low serum vitamin D    Muscle spasms of both lower extremities    Spasms in lower back   Non-celiac gluten sensitivity    Reactive airway disease    Spondyloarthritis    genetic marker   UTI (lower urinary tract infection)     Past Surgical History:  Procedure Laterality Date   ABDOMINAL HYSTERECTOMY  08/21/15   BILATERAL SALPINGECTOMY Bilateral 08/21/2015   Procedure: BILATERAL SALPINGECTOMY;  Surgeon: Nunzio Cobbs, MD;  Location: [facility];  Service: Gynecology;  Laterality: Bilateral;   COLONOSCOPY     CYSTOSCOPY N/A 08/21/2015   Procedure: CYSTOSCOPY;  Surgeon: Nunzio Cobbs, MD;  Location:  [facility];  Service: Gynecology;  Laterality: N/A;   MOUTH SURGERY     cyst in mouth   ROBOTIC ASSISTED TOTAL HYSTERECTOMY WITH SALPINGECTOMY Bilateral 08/21/2015   Procedure: ROBOTIC ASSISTED TOTAL HYSTERECTOMY ;  Surgeon: Nunzio Cobbs, MD;  Location: [facility];  Service: Gynecology;  Laterality: Bilateral;   WISDOM TOOTH EXTRACTION      Family History  Problem Relation Age of Onset   Hypertension Mother    Diabetes Mother    Anxiety disorder Mother    Diabetes  Father    Colon polyps Father    Prostate cancer Father    Cancer Paternal Grandmother        ? type    Social History   Socioeconomic History   Marital status: Married    Spouse name: Not on file   Number of children: Not on file   Years of education: Not on file   Highest education level: Not on file  Occupational History   Not on file  Tobacco Use   Smoking status: Never   Smokeless tobacco: Never  Vaping Use   Vaping Use: Never used  Substance and Sexual Activity   Alcohol use: Yes    Alcohol/week: 4.0 - 5.0 standard drinks of alcohol    Types: 4 - 5 Glasses of wine per week   Drug use: No   Sexual activity: Yes    Partners: Male    Comment: Hyst  Other Topics Concern   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 [name]. [name].    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     Calcium Carbonate-Vit D-Min (CALTRATE 600+D PLUS MINERALS) 600-800 MG-UNIT TABS One tab twice daily     folic acid (FOLVITE) 1 MG tablet Take 1 tablet by mouth daily.     methotrexate (RHEUMATREX) 2.5 MG tablet Take by mouth.     valACYclovir (VALTREX) 500 MG tablet Take one tab (500 mg) PO bid x 3 days prn for genital outbreak.  Take 4 tabs (2000 mg) orally every 12 hours for 24 hours for oral outbreak. 30 tablet 3   nitrofurantoin (MACRODANTIN) 50 MG capsule Then take one cap po post  coitally for prevention. 30 capsule 3   No facility-administered medications prior to visit.    Allergies  Allergen Reactions   Gluten Meal Other (See Comments)    Auto immune   Tramadol     nausea   Codeine Nausea And Vomiting   Peanut-Containing Drug Products Other (See Comments)    By allergy test...has eaten peanuts without issues     Review of Systems  Constitutional:  Negative for fever.       (-)unexpected weight change (-)Adenopathy  HENT:  Negative for congestion, sinus pain and sore throat.   Eyes:        (-)  Visual disturbance  Respiratory:  Negative for cough, shortness of breath and wheezing.   Cardiovascular:  Negative for chest pain, palpitations and leg swelling.  Gastrointestinal:  Negative for blood in stool, constipation, diarrhea, nausea and vomiting.  Genitourinary:  Negative for dysuria, frequency and hematuria.  Musculoskeletal:        (-)new muscle pain (-)new joint pain  Skin:        (-)new moles  Neurological:  Negative for dizziness and headaches.  Psychiatric/Behavioral:  Negative for depression. The patient is not nervous/anxious.        Objective:    Physical Exam Constitutional:      General: She is not in acute distress.    Appearance: Normal appearance. She is not ill-appearing.  HENT:     Head: Normocephalic and atraumatic.     Right Ear: Tympanic membrane, ear canal and external ear normal.     Left Ear: Tympanic membrane, ear canal and external ear normal.  Eyes:     Extraocular Movements: Extraocular movements intact.     Right eye: No nystagmus.     Left eye: No nystagmus.     Pupils: Pupils are equal, round, and reactive to light.  Neck:     Thyroid: No thyroid tenderness.  Cardiovascular:     Rate and Rhythm: Normal rate and regular rhythm.     Heart sounds: Normal heart sounds. No murmur heard.    No gallop.  Pulmonary:     Effort: Pulmonary effort is normal. No respiratory distress.     Breath sounds: Normal breath  sounds. No wheezing or rales.  Abdominal:     General: There is no distension.     Palpations: Abdomen is soft.     Tenderness: There is no abdominal tenderness. There is no guarding.  Musculoskeletal:     Comments: 5/5 strength in both upper and lower extremities  Lymphadenopathy:     Cervical: No cervical adenopathy.  Skin:    General: Skin is warm and dry.  Neurological:     Mental Status: She is alert and oriented to person, place, and time.     Deep Tendon Reflexes:     Reflex Scores:      Patellar reflexes are 2+ on the right side and 2+ on the left side. Psychiatric:        Judgment: Judgment normal.     BP 112/81 (BP Location: Right Arm, Patient Position: Sitting, Cuff Size: Small)   Pulse 72   Temp 98 F (36.7 C) (Oral)   Resp 16   Ht _0  (1.575 m)   Wt 118 lb (53.5 kg)   LMP 08/07/2015   SpO2 100%   BMI 21.58 kg/m  Wt Readings from Last 3 Encounters:  09/24/22 118 lb (53.5 kg)  05/06/22 120 lb (54.4 kg)  09/11/21 124 lb (56.2 kg)       Assessment & Plan:  Preventative health care Assessment & Plan: Continue healthy diet, exercise.  Flu shot today. Encouraged her to get a covid booster at her pharmacy.  Mammogram up to date- due for colo, see discussion re: scheduling.  S/p hysterectomy.   Orders: -     TSH -     Comprehensive metabolic panel -     Lipid panel  Needs flu shot -     Flu Vaccine QUAD 66moIM (Fluarix, Fluzone & Alfiuria Quad PF)  Low serum vitamin D Assessment & Plan: Had normal vitamin D back in October at [facility]  Hot flashes due to menopause Assessment & Plan: We discussed hormonal and non-hormonal treatments. She will see her GYN in 1 more week and will discuss further with them.    History of colitis Assessment & Plan: She is due for follow up colonoscopy. She has a specific provider outside the system that she would like to see. She will contact me via mychart with that information.      I, Nance Pear, NP,  personally preformed the services described in this documentation.  All medical record entries made by the scribe were at my direction and in my presence.  I have reviewed the chart and discharge instructions (if applicable) and agree that the record reflects my personal performance and is accurate and complete. 09/24/2022   I,Janice Zuniga,acting as a Education administrator for Nance Pear, NP.,have documented all relevant documentation on the behalf of Nance Pear, NP,as directed by  Nance Pear, NP while in the presence of Nance Pear, NP.  Nance Pear, NP

## 2022-09-24 NOTE — Assessment & Plan Note (Signed)
She had normal CBC performed this past October with rheumatology.

## 2022-09-24 NOTE — Assessment & Plan Note (Signed)
We discussed hormonal and non-hormonal treatments. She will see her GYN in 1 more week and will discuss further with them.

## 2022-09-26 ENCOUNTER — Encounter: Payer: Self-pay | Admitting: Family

## 2022-09-26 DIAGNOSIS — E785 Hyperlipidemia, unspecified: Secondary | ICD-10-CM | POA: Insufficient documentation

## 2022-09-27 ENCOUNTER — Other Ambulatory Visit: Payer: Self-pay | Admitting: Family

## 2022-09-27 DIAGNOSIS — H04221 Epiphora due to insufficient drainage, right lacrimal gland: Secondary | ICD-10-CM | POA: Diagnosis not present

## 2022-09-27 DIAGNOSIS — H524 Presbyopia: Secondary | ICD-10-CM | POA: Diagnosis not present

## 2022-09-27 DIAGNOSIS — H04561 Stenosis of right lacrimal punctum: Secondary | ICD-10-CM | POA: Diagnosis not present

## 2022-09-27 DIAGNOSIS — E785 Hyperlipidemia, unspecified: Secondary | ICD-10-CM

## 2022-10-01 ENCOUNTER — Ambulatory Visit: Payer: Federal, State, Local not specified - PPO | Admitting: Obstetrics and Gynecology

## 2022-10-04 NOTE — Progress Notes (Signed)
  Care Coordination Note  10/04/2022 Name: Nikiah Goin MRN: [id number] DOB: 1971/03/29  Idelle Leech Lakatos is a 52 y.o. year old female who is a primary care patient of [name], NP and is actively engaged with the care management team. I reached out to Currie Paris by phone today to assist with re-scheduling an initial visit with the RN Case Manager  Follow up plan: We have been unable to make contact with the patient for follow up.  Julian Hy, [place] Direct Dial: [phone]

## 2022-10-08 NOTE — Progress Notes (Deleted)
GYNECOLOGY  VISIT   HPI: 52 y.o.   Married  [place] American  female   [phone] with Patient's last menstrual period was 08/07/2015.   here for   menopause  GYNECOLOGIC HISTORY: Patient's last menstrual period was 08/07/2015. Contraception:  hysterectomy Menopausal hormone therapy:  n/a Last mammogram:  05/30/22 Breast Density Category C, BI-RADS CATEGORY 2 Benign Last pap smear:   03-31-15 Neg:Neg HR HPV;  2014 neg         OB History     Gravida  5   Para  3   Term  3   Preterm      AB  2   Living  3      SAB  1   IAB  1   Ectopic      Multiple      Live Births                 Patient Active Problem List   Diagnosis Date Noted   Hyperlipidemia 09/26/2022   Preventative health care 09/11/2021   Menopausal syndrome (hot flashes) 05/22/2021   Hot flashes due to menopause 04/24/2021   Crohn disease ([place]) 02/18/2017   History of uveitis 02/18/2017   Ankylosing spondylitis of multiple sites in spine ([place]) 01/29/2017   Encounter for long-term (current) use of high-risk medication 01/29/2017   Crohn's disease of both small and large intestine with complication ([place]) [id number]   Elevated ferritin level 11/01/2016   History of colitis 03/29/2016   Elevated serum globulin level 12/29/2015   Depression 09/15/2015   Vitamin D deficiency 09/08/2015   Anemia 09/08/2015   Status post laparoscopic hysterectomy 08/21/2015   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/12/2014   Extrinsic asthma 07/12/2014   Iron deficiency anemia 07/11/2014   HLA B27 positive 07/06/2014   Low serum vitamin D    Non-celiac gluten sensitivity    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[place])     Past Medical History:  Diagnosis Date   Abnormal CT of the abdomen    Abnormal uterine bleeding    Anemia    iron def   Ankylosing spondylitis (HCC)    Chicken pox    Colitis    Crohn disease ([place])    Dysrhythmia    occassional fluttering noted by patient   Genital warts    Gluten intolerance    History of  chlamydia    History of hysterectomy 08/21/2015   HLA B27 (HLA B27 positive)    HSV-1 (herpes simplex virus 1) infection    Hyperlipidemia    Low calcium levels    Low iron    Low serum vitamin D    Muscle spasms of both lower extremities    Spasms in lower back   Non-celiac gluten sensitivity    Reactive airway disease    Spondyloarthritis    genetic marker   UTI (lower urinary tract infection)     Past Surgical History:  Procedure Laterality Date   ABDOMINAL HYSTERECTOMY  08/21/15   BILATERAL SALPINGECTOMY Bilateral 08/21/2015   Procedure: BILATERAL SALPINGECTOMY;  Surgeon: Nunzio Cobbs, MD;  Location: [facility];  Service: Gynecology;  Laterality: Bilateral;   COLONOSCOPY     CYSTOSCOPY N/A 08/21/2015   Procedure: CYSTOSCOPY;  Surgeon: Nunzio Cobbs, MD;  Location: [facility];  Service: Gynecology;  Laterality: N/A;   MOUTH SURGERY     cyst in mouth   ROBOTIC ASSISTED TOTAL HYSTERECTOMY WITH SALPINGECTOMY Bilateral 08/21/2015   Procedure: ROBOTIC ASSISTED TOTAL HYSTERECTOMY ;  Surgeon: Nunzio Cobbs, MD;  Location: [facility];  Service: Gynecology;  Laterality: Bilateral;   WISDOM TOOTH EXTRACTION      Current Outpatient Medications  Medication Sig Dispense Refill   Adalimumab 40 MG/0.4ML PNKT Inject into the skin.     Calcium Carbonate-Vit D-Min (CALTRATE 600+D PLUS MINERALS) 600-800 MG-UNIT TABS One tab twice daily     folic acid (FOLVITE) 1 MG tablet Take 1 tablet by mouth daily.     valACYclovir (VALTREX) 500 MG tablet Take one tab (500 mg) PO bid x 3 days prn for genital outbreak.  Take 4 tabs (2000 mg) orally every 12 hours for 24 hours for oral outbreak. 30 tablet 3   No current facility-administered medications for this visit.     ALLERGIES: Gluten meal, Tramadol, Codeine, and Peanut-containing drug products  Family History  Problem Relation Age of Onset   Hypertension Mother    Diabetes Mother    Anxiety disorder Mother    Diabetes  Father    Colon polyps Father    Prostate cancer Father    Cancer Paternal Grandmother        ? type    Social History   Socioeconomic History   Marital status: Married    Spouse name: Not on file   Number of children: Not on file   Years of education: Not on file   Highest education level: Not on file  Occupational History   Not on file  Tobacco Use   Smoking status: Never   Smokeless tobacco: Never  Vaping Use   Vaping Use: Never used  Substance and Sexual Activity   Alcohol use: Yes    Alcohol/week: 4.0 - 5.0 standard drinks of alcohol    Types: 4 - 5 Glasses of wine per week   Drug use: No   Sexual activity: Yes    Partners: Male    Comment: Hyst  Other Topics Concern   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 [name]. [name].    Got B.S. 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      Hobbies: rest, reading, cooking, acting, piano, kids   Social Determinants of Health   Financial Resource Strain: Not on file  Food Insecurity: Not on file  Transportation Needs: Not on file  Physical Activity: Not on file  Stress: Not on file  Social Connections: Not on file  Intimate Partner Violence: Not on file    Review of Systems  PHYSICAL EXAMINATION:    LMP 08/07/2015     General appearance: alert, cooperative and appears stated age Head: Normocephalic, without obvious abnormality, atraumatic Neck: no adenopathy, supple, symmetrical, trachea midline and thyroid normal to inspection and palpation Lungs: clear to auscultation bilaterally Breasts: normal appearance, no masses or tenderness, No nipple retraction or dimpling, No nipple discharge or bleeding, No axillary or supraclavicular adenopathy Heart: regular rate and rhythm Abdomen: soft, non-tender, no masses,  no organomegaly Extremities: extremities normal,  atraumatic, no cyanosis or edema Skin: Skin color, texture, turgor normal. No rashes or lesions Lymph nodes: Cervical, supraclavicular, and axillary nodes normal. No abnormal inguinal nodes palpated Neurologic: Grossly normal  Pelvic: External genitalia:  no lesions              Urethra:  normal appearing urethra with no masses,  tenderness or lesions              Bartholins and Skenes: normal                 Vagina: normal appearing vagina with normal color and discharge, no lesions              Cervix: no lesions                Bimanual Exam:  Uterus:  normal size, contour, position, consistency, mobility, non-tender              Adnexa: no mass, fullness, tenderness              Rectal exam: {yes no:[id number]}.  Confirms.              Anus:  normal sphincter tone, no lesions  Chaperone was present for exam:  ***  ASSESSMENT     PLAN     An After Visit Summary was printed and given to the patient.  ______ minutes face to face time of which over 50% was spent in counseling.

## 2022-10-16 ENCOUNTER — Ambulatory Visit: Payer: Federal, State, Local not specified - PPO | Admitting: Obstetrics and Gynecology

## 2022-11-06 DIAGNOSIS — M450 Ankylosing spondylitis of multiple sites in spine: Secondary | ICD-10-CM | POA: Diagnosis not present

## 2022-11-06 DIAGNOSIS — Z1589 Genetic susceptibility to other disease: Secondary | ICD-10-CM | POA: Diagnosis not present

## 2022-11-06 DIAGNOSIS — E559 Vitamin D deficiency, unspecified: Secondary | ICD-10-CM | POA: Diagnosis not present

## 2022-11-06 DIAGNOSIS — Z79899 Other long term (current) drug therapy: Secondary | ICD-10-CM | POA: Diagnosis not present

## 2022-12-12 ENCOUNTER — Ambulatory Visit: Payer: Federal, State, Local not specified - PPO | Admitting: Family

## 2022-12-12 VITALS — BP 113/75 | HR 100 | Resp 18 | Ht 62.0 in | Wt 116.2 lb

## 2022-12-12 DIAGNOSIS — J302 Other seasonal allergic rhinitis: Secondary | ICD-10-CM

## 2022-12-12 DIAGNOSIS — E785 Hyperlipidemia, unspecified: Secondary | ICD-10-CM

## 2022-12-12 LAB — LIPID PANEL
Cholesterol: 195 mg/dL (ref 0–200)
HDL: 57.2 mg/dL (ref >39.00)
LDL Cholesterol: 124 mg/dL — ABNORMAL HIGH (ref 0–99)
NonHDL: 137.66
Total CHOL/HDL Ratio: 3
Triglycerides: 68 mg/dL (ref 0.0–149.0)
VLDL: 13.6 mg/dL (ref 0.0–40.0)

## 2022-12-12 NOTE — Progress Notes (Signed)
Subjective:     Patient ID: Janice Zuniga, female    DOB: 03/13/1971, 52 y.o.   MRN: [id number]  Chief Complaint  Patient presents with   Allergies    Fatigue and congestion     HPI Patient is in today with c/o fatigue, nasal congestion and throat irritation.  She reports that symptoms started early to middle last week.  Had negtive covid test. Tried zyrtc which made her feel more tired. She took claritin-D.  Hyperlipidemia- she is working on diet and wishes to recheck her blood pressure.   Health Maintenance Due  Topic Date Due   Hepatitis C Screening  Never done   COLONOSCOPY (Pts 45-93yrs Insurance coverage will need to be confirmed)  06/28/2017   COVID-19 Vaccine (4 - 2023-24 season) 05/24/2022    Past Medical History:  Diagnosis Date   Abnormal CT of the abdomen    Abnormal uterine bleeding    Anemia    iron def   Ankylosing spondylitis (HCC)    Chicken pox    Colitis    Crohn disease ([place])    Dysrhythmia    occassional fluttering noted by patient   Genital warts    Gluten intolerance    History of chlamydia    History of hysterectomy 08/21/2015   HLA B27 (HLA B27 positive)    HSV-1 (herpes simplex virus 1) infection    Hyperlipidemia    Low calcium levels    Low iron    Low serum vitamin D    Muscle spasms of both lower extremities    Spasms in lower back   Non-celiac gluten sensitivity    Reactive airway disease    Spondyloarthritis    genetic marker   UTI (lower urinary tract infection)     Past Surgical History:  Procedure Laterality Date   ABDOMINAL HYSTERECTOMY  08/21/15   BILATERAL SALPINGECTOMY Bilateral 08/21/2015   Procedure: BILATERAL SALPINGECTOMY;  Surgeon: Nunzio Cobbs, MD;  Location: [facility];  Service: Gynecology;  Laterality: Bilateral;   COLONOSCOPY     CYSTOSCOPY N/A 08/21/2015   Procedure: CYSTOSCOPY;  Surgeon: Nunzio Cobbs, MD;  Location: [facility];  Service: Gynecology;  Laterality: N/A;   MOUTH  SURGERY     cyst in mouth   ROBOTIC ASSISTED TOTAL HYSTERECTOMY WITH SALPINGECTOMY Bilateral 08/21/2015   Procedure: ROBOTIC ASSISTED TOTAL HYSTERECTOMY ;  Surgeon: Nunzio Cobbs, MD;  Location: [facility];  Service: Gynecology;  Laterality: Bilateral;   WISDOM TOOTH EXTRACTION      Family History  Problem Relation Age of Onset   Hypertension Mother    Diabetes Mother    Anxiety disorder Mother    Diabetes Father    Colon polyps Father    Prostate cancer Father    Cancer Paternal Grandmother        ? type    Social History   Socioeconomic History   Marital status: Married    Spouse name: Not on file   Number of children: Not on file   Years of education: Not on file   Highest education level: Not on file  Occupational History   Not on file  Tobacco Use   Smoking status: Never   Smokeless tobacco: Never  Vaping Use   Vaping Use: Never used  Substance and Sexual Activity   Alcohol use: Yes    Alcohol/week: 4.0 - 5.0 standard drinks of alcohol    Types: 4 - 5 Glasses of  wine per week   Drug use: No   Sexual activity: Yes    Partners: Male    Comment: Hyst  Other Topics Concern   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 [name]. [name].    Got B[employer]S. 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     Calcium Carbonate-Vit D-Min (CALTRATE 600+D PLUS MINERALS) 600-800  MG-UNIT TABS One tab twice daily     folic acid (FOLVITE) 1 MG tablet Take 1 tablet by mouth daily.     valACYclovir (VALTREX) 500 MG tablet Take one tab (500 mg) PO bid x 3 days prn for genital outbreak.  Take 4 tabs (2000 mg) orally every 12 hours for 24 hours for oral outbreak. 30 tablet 3   No facility-administered medications prior to visit.    Allergies  Allergen Reactions   Gluten Meal Other (See Comments)    Auto immune   Tramadol     nausea   Codeine Nausea And Vomiting   Peanut-Containing Drug Products Other (See Comments)    By allergy test...has eaten peanuts without issues     ROS    See HPI Objective:    Physical Exam Constitutional:      General: She is not in acute distress.    Appearance: Normal appearance. She is well-developed.  HENT:     Head: Normocephalic and atraumatic.     Right Ear: External ear normal.     Left Ear: External ear normal.  Eyes:     General: No scleral icterus. Neck:     Thyroid: No thyromegaly.  Cardiovascular:     Rate and Rhythm: Normal rate and regular rhythm.     Heart sounds: Normal heart sounds. No murmur heard. Pulmonary:     Effort: Pulmonary effort is normal. No respiratory distress.     Breath sounds: Normal breath sounds. No wheezing.  Musculoskeletal:     Cervical back: Neck supple.  Skin:    General: Skin is warm and dry.  Neurological:     Mental Status: She is alert and oriented to person, place, and time.  Psychiatric:        Mood and Affect: Mood normal.        Behavior: Behavior normal.        Thought Content: Thought content normal.        Judgment: Judgment normal.     BP 113/75 (BP Location: Left Arm, Patient Position: Sitting, Cuff Size: Normal)   Pulse 100   Resp 18   Ht 5\' 2"  (1.575 m)   Wt 116 lb 3.2 oz (52.7 kg)   LMP 08/07/2015   SpO2 (!) 82%   BMI 21.25 kg/m  Wt Readings from Last 3 Encounters:  12/12/22 116 lb 3.2 oz (52.7 kg)  09/24/22 118 lb (53.5 kg)  05/06/22  120 lb (54.4 kg)        Assessment & Plan:   Problem List Items Addressed This Visit       Unprioritized   Seasonal allergic rhinitis    Uncontrolled. Trial of plain claritin. If symptoms persist can add in flonase.       Hyperlipidemia - Primary    Continue low cholesterol diet.       Relevant Orders   Lipid panel    I am having Janice Zuniga. Huet maintain her folic acid, Caltrate [id number] Plus Minerals, Adalimumab, and valACYclovir.  No orders of the defined types were placed in this encounter.

## 2022-12-12 NOTE — Assessment & Plan Note (Signed)
Continue low-cholesterol diet

## 2022-12-12 NOTE — Assessment & Plan Note (Signed)
Uncontrolled. Trial of plain claritin. If symptoms persist can add in flonase.

## 2023-02-18 ENCOUNTER — Encounter: Payer: Self-pay | Admitting: Obstetrics and Gynecology

## 2023-02-18 ENCOUNTER — Ambulatory Visit: Payer: Federal, State, Local not specified - PPO | Admitting: Obstetrics and Gynecology

## 2023-02-18 VITALS — BP 112/62 | HR 77 | Wt 113.0 lb

## 2023-02-18 DIAGNOSIS — N951 Menopausal and female climacteric states: Secondary | ICD-10-CM | POA: Diagnosis not present

## 2023-02-18 MED ORDER — ESTRADIOL 0.025 MG/24HR TD PTTW: 1.0000 | MEDICATED_PATCH | TRANSDERMAL | 1 refills | Status: DC

## 2023-02-18 NOTE — Patient Instructions (Signed)

## 2023-02-18 NOTE — Progress Notes (Signed)
GYNECOLOGY  VISIT   HPI: 52 y.o.   Married Black or [place] American Not Hispanic or Latino  female   [phone] with Patient's last menstrual period was 08/07/2015.   here to discuss hot flashes. They are occurring all the time. She says more so at night but she does get them during the day as well.  H/O hysterectomy. Still has her ovaries.  Vasomotor symptoms started last year. She started taking a supplement, symptoms improved. Symptoms have gotten worse in the last few months. She is having bad hot flashes and night sweats a night. She is up 3-4 x a night. During the day having 5-7 hot flashes a day. Vary in intensity.   GYNECOLOGIC HISTORY: Patient's last menstrual period was 08/07/2015. Contraception:hysterectomy  Menopausal hormone therapy: none         OB History     Gravida  5   Para  3   Term  3   Preterm      AB  2   Living  3      SAB  1   IAB  1   Ectopic      Multiple      Live Births                 Patient Active Problem List   Diagnosis Date Noted   Seasonal allergic rhinitis 12/12/2022   Hyperlipidemia 09/26/2022   Preventative health care 09/11/2021   Menopausal syndrome (hot flashes) 05/22/2021   Hot flashes due to menopause 04/24/2021   Crohn disease (HCC) 02/18/2017   History of uveitis 02/18/2017   Ankylosing spondylitis of multiple sites in spine (HCC) 01/29/2017   Encounter for long-term (current) use of high-risk medication 01/29/2017   Crohn's disease of both small and large intestine with complication (HCC) 11/01/2016   Elevated ferritin level 11/01/2016   History of colitis 03/29/2016   Elevated serum globulin level 12/29/2015   Depression 09/15/2015   Vitamin D deficiency 09/08/2015   Anemia 09/08/2015   Status post laparoscopic hysterectomy 08/21/2015   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/12/2014   Extrinsic asthma 07/12/2014   Iron deficiency anemia 07/11/2014   HLA B27 positive 07/06/2014   Low serum vitamin D     Non-celiac gluten sensitivity    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HCC)     Past Medical History:  Diagnosis Date   Abnormal CT of the abdomen    Abnormal uterine bleeding    Anemia    iron def   Ankylosing spondylitis (HCC)    Chicken pox    Colitis    Crohn disease (HCC)    Dysrhythmia    occassional fluttering noted by patient   Genital warts    Gluten intolerance    History of chlamydia    History of hysterectomy 08/21/2015   HLA B27 (HLA B27 positive)    HSV-1 (herpes simplex virus 1) infection    Hyperlipidemia    Low calcium levels    Low iron    Low serum vitamin D    Muscle spasms of both lower extremities    Spasms in lower back   Non-celiac gluten sensitivity    Reactive airway disease    Spondyloarthritis    genetic marker   UTI (lower urinary tract infection)     Past Surgical History:  Procedure Laterality Date   ABDOMINAL HYSTERECTOMY  08/21/15   BILATERAL SALPINGECTOMY Bilateral 08/21/2015   Procedure: BILATERAL SALPINGECTOMY;  Surgeon: Patton Salles, MD;  Location: [facility];  Service: Gynecology;  Laterality: Bilateral;   COLONOSCOPY     CYSTOSCOPY N/A 08/21/2015   Procedure: CYSTOSCOPY;  Surgeon: Patton Salles, MD;  Location: [facility];  Service: Gynecology;  Laterality: N/A;   MOUTH SURGERY     cyst in mouth   ROBOTIC ASSISTED TOTAL HYSTERECTOMY WITH SALPINGECTOMY Bilateral 08/21/2015   Procedure: ROBOTIC ASSISTED TOTAL HYSTERECTOMY ;  Surgeon: Patton Salles, MD;  Location: [facility];  Service: Gynecology;  Laterality: Bilateral;   WISDOM TOOTH EXTRACTION      Current Outpatient Medications  Medication Sig Dispense Refill   Adalimumab 40 MG/0.4ML PNKT Inject into the skin.     Calcium Carbonate-Vit D-Min (CALTRATE 600+D PLUS MINERALS) 600-800 MG-UNIT TABS One tab twice daily     [START ON 02/20/2023] estradiol (VIVELLE-DOT) 0.025 MG/24HR Place 1 patch onto the skin 2 (two) times a week. 8 patch 1   folic acid (FOLVITE) 1 MG tablet  Take 1 tablet by mouth daily.     methotrexate (RHEUMATREX) 2.5 MG tablet Take 2.5 mg by mouth once a week.     valACYclovir (VALTREX) 500 MG tablet Take one tab (500 mg) PO bid x 3 days prn for genital outbreak.  Take 4 tabs (2000 mg) orally every 12 hours for 24 hours for oral outbreak. 30 tablet 3   No current facility-administered medications for this visit.     ALLERGIES: Gluten meal, Tramadol, Codeine, and Peanut-containing drug products  Family History  Problem Relation Age of Onset   Hypertension Mother    Diabetes Mother    Anxiety disorder Mother    Diabetes Father    Colon polyps Father    Prostate cancer Father    Cancer Paternal Grandmother        ? type    Social History   Socioeconomic History   Marital status: Married    Spouse name: Not on file   Number of children: Not on file   Years of education: Not on file   Highest education level: Not on file  Occupational History   Not on file  Tobacco Use   Smoking status: Never   Smokeless tobacco: Never  Vaping Use   Vaping Use: Never used  Substance and Sexual Activity   Alcohol use: Yes    Alcohol/week: 4.0 - 5.0 standard drinks of alcohol    Types: 4 - 5 Glasses of wine per week   Drug use: No   Sexual activity: Yes    Partners: Male    Comment: Hyst  Other Topics Concern   Not on file  Social History Narrative   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  Alexanders '95. [name].    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   PHYSICAL EXAMINATION:    BP 112/62   Pulse 77   Wt 113 lb (51.3 kg)   LMP 08/07/2015   SpO2  100%   BMI 20.67 kg/m     General appearance: alert, cooperative and appears stated age  81. Vasomotor symptoms due to menopause Discussed behavior changes, OTC agents, SSRI's, Gabapentin and ERT.  -No contraindications to HRT, risks reviewed. Will start low dose ERT and f/u with Dr Edward Jolly in one month. - estradiol (VIVELLE-DOT) 0.025 MG/24HR; Place 1 patch onto the skin 2 (two) times a week.  Dispense: 8 patch; Refill: 1

## 2023-02-19 ENCOUNTER — Encounter: Payer: Self-pay | Admitting: Podiatry

## 2023-02-19 ENCOUNTER — Ambulatory Visit: Payer: Federal, State, Local not specified - PPO | Admitting: Podiatry

## 2023-02-19 DIAGNOSIS — S8412XA Injury of peroneal nerve at lower leg level, left leg, initial encounter: Secondary | ICD-10-CM | POA: Diagnosis not present

## 2023-02-19 NOTE — Progress Notes (Signed)
  Subjective:  Patient ID: Janice Zuniga, female    DOB: 01/11/1971,   MRN: [id number]  Chief Complaint  Patient presents with   Numbness    RM 23 Left foot numbness and tingling x 2 months. Pt states numbness and tingling starts at the top of left left foot up the lateral side of leg. Pt states she is in her garden a lot where she bend her foot and up and down a lot. Pt also exercises and states she is not wearing the correct shoes.     52 y.o. female presents for concern as above. Denies any treatments.  . Denies any other pedal complaints. Denies n/v/f/c.   Past Medical History:  Diagnosis Date   Abnormal CT of the abdomen    Abnormal uterine bleeding    Anemia    iron def   Ankylosing spondylitis (HCC)    Chicken pox    Colitis    Crohn disease (HCC)    Dysrhythmia    occassional fluttering noted by patient   Genital warts    Gluten intolerance    History of chlamydia    History of hysterectomy 08/21/2015   HLA B27 (HLA B27 positive)    HSV-1 (herpes simplex virus 1) infection    Hyperlipidemia    Low calcium levels    Low iron    Low serum vitamin D    Muscle spasms of both lower extremities    Spasms in lower back   Non-celiac gluten sensitivity    Reactive airway disease    Spondyloarthritis    genetic marker   UTI (lower urinary tract infection)     Objective:  Physical Exam: Vascular: DP/PT pulses 2/4 bilateral. CFT <3 seconds. Normal hair growth on digits. No edema.  Skin. No lacerations or abrasions bilateral feet.  Musculoskeletal: MMT 5/5 bilateral lower extremities in DF, PF, Inversion and Eversion. Deceased ROM in DF of ankle joint.  No tenderness to palpation of the foot. Negative tinel's currently around deep peroneal and common peroneal nerve. Relates symptoms extend from dorsum of foot to anterior leg and outside of leg around knee.  Neurological: Sensation intact to light touch.   Assessment:   1. 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 Discussed common peroneal neurits and treatment options with patient.  Discussed ways to avoid irritation of the nerve such as not crossing legs or sitting on leg.  Discussed creams to calm the nerves such as capsaicin or lidocaine cream as well as nerve vitamins.  Discussed supportive shoes and inserts Discussed if pain does not improve may consider  NCV for surgical planning if needed.  Patient to return as needed     Louann Sjogren, DPM

## 2023-03-04 NOTE — Progress Notes (Deleted)
GYNECOLOGY  VISIT   HPI: 52 y.o.   Married  [place]  female   [phone] with Patient's last menstrual period was 08/07/2015.   here for   4 week f/u HRT  GYNECOLOGIC HISTORY: Patient's last menstrual period was 08/07/2015. Contraception:  hyst Menopausal hormone therapy:  vivelle-dot Last mammogram:  05/30/22 Breast Density Cat C, BI-RADS CAT 2 benign Last pap smear:   03-31-15 Neg:Neg HR HPV;  2014 neg         OB History     Gravida  5   Para  3   Term  3   Preterm      AB  2   Living  3      SAB  1   IAB  1   Ectopic      Multiple      Live Births                 Patient Active Problem List   Diagnosis Date Noted   Seasonal allergic rhinitis 12/12/2022   Hyperlipidemia 09/26/2022   Preventative health care 09/11/2021   Menopausal syndrome (hot flashes) 05/22/2021   Hot flashes due to menopause 04/24/2021   Crohn disease (HCC) 02/18/2017   History of uveitis 02/18/2017   Ankylosing spondylitis of multiple sites in spine (HCC) 01/29/2017   Encounter for long-term (current) use of high-risk medication 01/29/2017   Crohn's disease of both small and large intestine with complication (HCC) 11/01/2016   Elevated ferritin level 11/01/2016   History of colitis 03/29/2016   Elevated serum globulin level 12/29/2015   Depression 09/15/2015   Vitamin D deficiency 09/08/2015   Anemia 09/08/2015   Status post laparoscopic hysterectomy 08/21/2015   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/12/2014   Extrinsic asthma 07/12/2014   Iron deficiency anemia 07/11/2014   HLA B27 positive 07/06/2014   Low serum vitamin D    Non-celiac gluten sensitivity    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HCC)     Past Medical History:  Diagnosis Date   Abnormal CT of the abdomen    Abnormal uterine bleeding    Anemia    iron def   Ankylosing spondylitis (HCC)    Chicken pox    Colitis    Crohn disease (HCC)    Dysrhythmia    occassional fluttering noted by patient   Genital warts     Gluten intolerance    History of chlamydia    History of hysterectomy 08/21/2015   HLA B27 (HLA B27 positive)    HSV-1 (herpes simplex virus 1) infection    Hyperlipidemia    Low calcium levels    Low iron    Low serum vitamin D    Muscle spasms of both lower extremities    Spasms in lower back   Non-celiac gluten sensitivity    Reactive airway disease    Spondyloarthritis    genetic marker   UTI (lower urinary tract infection)     Past Surgical History:  Procedure Laterality Date   ABDOMINAL HYSTERECTOMY  08/21/15   BILATERAL SALPINGECTOMY Bilateral 08/21/2015   Procedure: BILATERAL SALPINGECTOMY;  Surgeon: Patton Salles, MD;  Location: [facility];  Service: Gynecology;  Laterality: Bilateral;   COLONOSCOPY     CYSTOSCOPY N/A 08/21/2015   Procedure: CYSTOSCOPY;  Surgeon: Patton Salles, MD;  Location: [facility];  Service: Gynecology;  Laterality: N/A;   MOUTH SURGERY     cyst in mouth   ROBOTIC ASSISTED TOTAL HYSTERECTOMY WITH SALPINGECTOMY Bilateral  08/21/2015   Procedure: ROBOTIC ASSISTED TOTAL HYSTERECTOMY ;  Surgeon: Patton Salles, MD;  Location: [facility];  Service: Gynecology;  Laterality: Bilateral;   WISDOM TOOTH EXTRACTION      Current Outpatient Medications  Medication Sig Dispense Refill   Adalimumab 40 MG/0.4ML PNKT Inject into the skin.     Calcium Carbonate-Vit D-Min (CALTRATE 600+D PLUS MINERALS) 600-800 MG-UNIT TABS One tab twice daily     estradiol (VIVELLE-DOT) 0.025 MG/24HR Place 1 patch onto the skin 2 (two) times a week. 8 patch 1   folic acid (FOLVITE) 1 MG tablet Take 1 tablet by mouth daily.     methotrexate (RHEUMATREX) 2.5 MG tablet Take 2.5 mg by mouth once a week.     valACYclovir (VALTREX) 500 MG tablet Take one tab (500 mg) PO bid x 3 days prn for genital outbreak.  Take 4 tabs (2000 mg) orally every 12 hours for 24 hours for oral outbreak. 30 tablet 3   No current facility-administered medications for this visit.      ALLERGIES: Gluten meal, Tramadol, Codeine, and Peanut-containing drug products  Family History  Problem Relation Age of Onset   Hypertension Mother    Diabetes Mother    Anxiety disorder Mother    Diabetes Father    Colon polyps Father    Prostate cancer Father    Cancer Paternal Grandmother        ? type    Social History   Socioeconomic History   Marital status: Married    Spouse name: Not on file   Number of children: Not on file   Years of education: Not on file   Highest education level: Not on file  Occupational History   Not on file  Tobacco Use   Smoking status: Never   Smokeless tobacco: Never  Vaping Use   Vaping Use: Never used  Substance and Sexual Activity   Alcohol use: Yes    Alcohol/week: 4.0 - 5.0 standard drinks of alcohol    Types: 4 - 5 Glasses of wine per week   Drug use: No   Sexual activity: Yes    Partners: Male    Comment: Hyst  Other Topics Concern   Not on file  Social History Narrative   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 Jill Alexanders '95. [name].    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      Hobbies: rest, reading, cooking, acting, piano, kids   Social Determinants of Health   Financial Resource Strain: Not on file  Food Insecurity: Not on file  Transportation Needs: Not on file  Physical Activity: Not on file  Stress: Not on file  Social Connections: Not on file  Intimate Partner Violence: Not on file    Review of Systems  PHYSICAL EXAMINATION:    LMP 08/07/2015     General appearance: alert, cooperative and appears stated age Head: Normocephalic, without obvious abnormality, atraumatic Neck: no adenopathy, supple, symmetrical, trachea midline and thyroid normal to inspection and palpation Lungs: clear to auscultation bilaterally Breasts: normal appearance, no masses or tenderness, No nipple retraction  or dimpling, No nipple discharge or bleeding, No axillary or supraclavicular adenopathy Heart: regular rate and rhythm Abdomen: soft, non-tender, no masses,  no organomegaly Extremities: extremities normal, atraumatic, no cyanosis or edema Skin: Skin color, texture, turgor normal. No  rashes or lesions Lymph nodes: Cervical, supraclavicular, and axillary nodes normal. No abnormal inguinal nodes palpated Neurologic: Grossly normal  Pelvic: External genitalia:  no lesions              Urethra:  normal appearing urethra with no masses, tenderness or lesions              Bartholins and Skenes: normal                 Vagina: normal appearing vagina with normal color and discharge, no lesions              Cervix: no lesions                Bimanual Exam:  Uterus:  normal size, contour, position, consistency, mobility, non-tender              Adnexa: no mass, fullness, tenderness              Rectal exam: {yes no:[id number]}.  Confirms.              Anus:  normal sphincter tone, no lesions  Chaperone was present for exam:  ***  ASSESSMENT     PLAN     An After Visit Summary was printed and given to the patient.  ______ minutes face to face time of which over 50% was spent in counseling.

## 2023-03-14 ENCOUNTER — Encounter: Payer: Self-pay | Admitting: Family

## 2023-03-14 ENCOUNTER — Ambulatory Visit (INDEPENDENT_AMBULATORY_CARE_PROVIDER_SITE_OTHER): Payer: Federal, State, Local not specified - PPO | Admitting: Psychology

## 2023-03-14 DIAGNOSIS — F4322 Adjustment disorder with anxiety: Secondary | ICD-10-CM | POA: Diagnosis not present

## 2023-03-14 NOTE — Progress Notes (Signed)
[facility] Behavioral Health Counselor Initial Adult Exam  Name: Paelyn Smick Date: 03/14/2023 MRN: [id number] DOB: [birth year] PCP: Sandford Craze, NP  Time spent: 3:00pm-3:50pm   50 minutes  Guardian/Payee:  n/a    Paperwork requested: No   Reason for Visit /Presenting Problem: Pt present for face-to-face initial assessment via video.  Pt consents to telehealth video session and is aware of limitations of virtual sessions. Location of pt: home Location of therapist: home office.  Pt is returning to therapy bc she has had a lot of recent stressors.  Pt states that two months ago she and her husband got into a huge argument.  Pt felt so angry that she verbally lashed out at her husband in a way she never has.   Pt states she has not talked to her parents since November.   There have been long standing relationship issues with pt's parents that relate back to her childhood. Pt also has work stress.  Pt has been having more anxiety and has felt more fearful.     Mental Status Exam: Appearance:   Casual     Behavior:  Appropriate  Motor:  Normal  Speech/Language:   Normal Rate  Affect:  Appropriate  Mood:  normal  Thought process:  normal  Thought content:    WNL  Sensory/Perceptual disturbances:    WNL  Orientation:  oriented to person, place, time/date, and situation  Attention:  Good  Concentration:  Good  Memory:  WNL  Fund of knowledge:   Good  Insight:    Good  Judgment:   Good  Impulse Control:  Good    Reported Symptoms:  anxiety  Risk Assessment: Danger to Self:  No Self-injurious Behavior: No Danger to Others: No Duty to Warn:no Physical Aggression / Violence:No  Access to Firearms a concern: No  Gang Involvement:No  Patient / guardian was educated about steps to take if suicide or homicide risk level increases between visits: n/a While future psychiatric events cannot be accurately predicted, the patient does not currently require acute inpatient  psychiatric care and does not currently meet [facility] involuntary commitment criteria.  Substance Abuse History: Current substance abuse: No     Past Psychiatric History:   Previous psychological history is significant for anxiety and depression Outpatient Providers:pt has been in therapy in the past. History of Psych Hospitalization: No  Psychological Testing:  n/a    Abuse History:  Victim of: No.,  n/a    Report needed: No. Victim of Neglect:No. Perpetrator of  n/a   Witness / Exposure to Domestic Violence: No   Protective Services Involvement: No  Witness to [employer] Violence:  No   Family History:  Family History  Problem Relation Age of Onset   Hypertension Mother    Diabetes Mother    Anxiety disorder Mother    Diabetes Father    Colon polyps Father    Prostate cancer Father    Cancer Paternal Grandmother        ? type    Living situation: the patient lives with her family  Sexual Orientation: Straight  Relationship Status: married for 25 years. Name of spouse / other:Mark If a parent, number of children / ages:3 children ages 44, 50, and 34.   Support Systems: spouse friends  Surveyor, quantity Stress:  No   Income/Employment/Disability: Employment Pt is working for the [employer], operational.  Pt works remotely from home.  Pt is not thrilled with her job.   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   Pt likes to work out.    Stressors: Marital or family conflict   Occupational concerns    Strengths: Supportive Relationships, Hopefulness, Self Advocate, and Able to Communicate Effectively  Barriers:  none   Legal History: Pending legal issue / charges: The patient has no  significant history of legal issues. History of legal issue / charges:  n/a  Medical History/Surgical History: reviewed Past Medical History:  Diagnosis Date   Abnormal CT of the abdomen    Abnormal uterine bleeding    Anemia    iron def   Ankylosing spondylitis (HCC)    Chicken pox    Colitis    Crohn disease (HCC)    Dysrhythmia    occassional fluttering noted by patient   Genital warts    Gluten intolerance    History of chlamydia    History of hysterectomy 08/21/2015   HLA B27 (HLA B27 positive)    HSV-1 (herpes simplex virus 1) infection    Hyperlipidemia    Low calcium levels    Low iron    Low serum vitamin D    Muscle spasms of both lower extremities    Spasms in lower back   Non-celiac gluten sensitivity    Reactive airway disease    Spondyloarthritis    genetic marker   UTI (lower urinary tract infection)     Past Surgical History:  Procedure Laterality Date   ABDOMINAL HYSTERECTOMY  08/21/15   BILATERAL SALPINGECTOMY Bilateral 08/21/2015   Procedure: BILATERAL SALPINGECTOMY;  Surgeon: Patton Salles, MD;  Location: [facility];  Service: Gynecology;  Laterality: Bilateral;   COLONOSCOPY     CYSTOSCOPY N/A 08/21/2015   Procedure: CYSTOSCOPY;  Surgeon: Patton Salles, MD;  Location: [facility];  Service: Gynecology;  Laterality: N/A;   MOUTH SURGERY     cyst in mouth   ROBOTIC ASSISTED TOTAL HYSTERECTOMY WITH SALPINGECTOMY Bilateral 08/21/2015   Procedure: ROBOTIC ASSISTED TOTAL HYSTERECTOMY ;  Surgeon: Patton Salles, MD;  Location: [facility];  Service: Gynecology;  Laterality: Bilateral;   WISDOM TOOTH EXTRACTION      Medications: Current Outpatient Medications  Medication Sig Dispense Refill   Adalimumab 40 MG/0.4ML PNKT Inject into the skin.     Calcium Carbonate-Vit D-Min (CALTRATE 600+D PLUS MINERALS) 600-800 MG-UNIT TABS One tab twice daily     estradiol (VIVELLE-DOT) 0.025 MG/24HR Place 1 patch onto the skin 2 (two) times a  week. 8 patch 1   folic acid (FOLVITE) 1 MG tablet Take 1 tablet by mouth daily.     methotrexate (RHEUMATREX) 2.5 MG tablet Take 2.5 mg by mouth once a week.     valACYclovir (VALTREX) 500 MG tablet Take one tab (500 mg) PO bid x 3 days prn for genital outbreak.  Take 4 tabs (2000 mg) orally every 12 hours for 24 hours for oral outbreak. 30 tablet 3   No current facility-administered medications for this visit.    Allergies  Allergen Reactions   Gluten Meal Other (See Comments)    Auto immune   Tramadol     nausea   Codeine Nausea And Vomiting   Peanut-Containing Drug Products Other (See Comments)    By allergy test...has eaten peanuts without issues  Diagnoses:  F43.22  Plan of Care: Recommend ongoing therapy.  Pt participated in setting treatment goals.  Pt wants to improve coping skills and relationships.  Pt wants to have a safe place to talk.  Plan to meet every two weeks.   Treatment Plan Client Abilities/Strengths  Pt is bright, engaging and motivated for therapy.  Client Treatment Preferences  Individual therapy.  Client Statement of Needs  Improve coping skills and relationships. Symptoms  Autonomic hyperactivity (e.g., palpitations, shortness of breath, dry mouth, trouble swallowing, nausea, diarrhea). Excessive and/or unrealistic worry that is difficult to control occurring more days than not for at least 6 months about a number of events or activities. Hypervigilance (e.g., feeling constantly on edge, experiencing concentration difficulties, having trouble falling or staying asleep, exhibiting a general state of irritability). Motor tension (e.g., restlessness, tiredness, shakiness, muscle tension). Problems Addressed  Anxiety Goals 1. Enhance ability to effectively cope with the full variety of life's worries and anxieties. 2. Learn and implement coping skills that result in a reduction of anxiety and worry, and improved daily functioning. Objective Learn to  accept limitations in life and commit to tolerating, rather than avoiding, unpleasant emotions while accomplishing meaningful goals. Target Date: 2024-03-13 Frequency: Biweekly Progress: 10 Modality: individual Related Interventions 1. Use techniques from Acceptance and Commitment Therapy to help client accept uncomfortable realities such as lack of complete control, imperfections, and uncertainty and tolerate unpleasant emotions and thoughts in order to accomplish value-consistent goals. Objective Learn and implement problem-solving strategies for realistically addressing worries. Target Date: 2024-03-13 Frequency: Biweekly Progress: 10 Modality: individual Related Interventions 1. Assign the client a homework exercise in which he/she problem-solves a current problem.  review, reinforce success, and provide corrective feedback toward improvement. 2. Teach the client problem-solving strategies involving specifically defining a problem, generating options for addressing it, evaluating the pros and cons of each option, selecting and implementing an optional action, and reevaluating and refining the action. Objective Learn and implement calming skills to reduce overall anxiety and manage anxiety symptoms. Target Date: 2024-03-13 Frequency: Biweekly Progress: 10 Modality: individual Related Interventions 1. Assign the client to read about progressive muscle relaxation and other calming strategies in relevant books or treatment manuals (e.g., Progressive Relaxation Training by Robb Matar and Alen Blew; Mastery of Your Anxiety and Worry: Workbook by Earlie Counts). 2. Assign the client homework each session in which he/she practices relaxation exercises daily, gradually applying them progressively from non-anxiety-provoking to anxiety-provoking situations; review and reinforce success while providing corrective feedback toward improvement. 3. Teach the client calming/relaxation skills (e.g., applied  relaxation, progressive muscle relaxation, cue controlled relaxation; mindful breathing; biofeedback) and how to discriminate better between relaxation and tension; teach the client how to apply these skills to his/her daily life. 3. Reduce overall frequency, intensity, and duration of the anxiety so that daily functioning is not impaired. 4. Resolve the core conflict that is the source of anxiety. 5. Stabilize anxiety level while increasing ability to function on a daily basis. Diagnosis :    F43.22 Conditions For Discharge Achievement of treatment goals and objectives.      Khalila Buechner, LCSW

## 2023-03-18 ENCOUNTER — Ambulatory Visit: Payer: Federal, State, Local not specified - PPO | Admitting: Obstetrics and Gynecology

## 2023-03-25 ENCOUNTER — Ambulatory Visit (INDEPENDENT_AMBULATORY_CARE_PROVIDER_SITE_OTHER): Payer: Federal, State, Local not specified - PPO | Admitting: Psychology

## 2023-03-25 DIAGNOSIS — F4322 Adjustment disorder with anxiety: Secondary | ICD-10-CM

## 2023-03-25 NOTE — Progress Notes (Signed)
[facility] Counselor/Therapist Progress Note  Patient ID: Janice Zuniga, MRN: [id number],    Date: 03/25/2023  Time Spent: 3:00pm-3:55pm   55 minutes   Treatment Type: Individual Therapy  Reported Symptoms: stress  Mental Status Exam: Appearance:  Casual     Behavior: Appropriate  Motor: Normal  Speech/Language:  Normal Rate  Affect: Appropriate  Mood: normal  Thought process: normal  Thought content:   WNL  Sensory/Perceptual disturbances:   WNL  Orientation: oriented to person, place, time/date, and situation  Attention: Good  Concentration: Good  Memory: WNL  Fund of knowledge:  Good  Insight:   Good  Judgment:  Good  Impulse Control: Good   Risk Assessment: Danger to Self:  No Self-injurious Behavior: No Danger to Others: No Duty to Warn:no Physical Aggression / Violence:No  Access to Firearms a concern: No  Gang Involvement:No   Subjective: Pt present for face-to-face individual therapy via video.  Pt consents to telehealth video session and is aware of limitations of virtual sessions. Location of pt: home Location of therapist: home office.   Pt has been trying to work on her negative thoughts.  She can tend to ruminate about past issues with her parents.  Pt can tend to get moody and "in a funk" and then projects that onto her family especially her husband.   Addressed the issues and helped pt process her feelings and relationship dynamics.  Identified how pt's childhood contributed to these dynamics.  Her father was very controlling and pt felt like she did not have a voice.   Pt states she has been given feedback that she always has to be "right" and insert herself into the narrative.   Pt is working on listening.  Pt talked about not talking to her parents since November.   Pt got angry at her parents at that time bc of things they said about pt's brother's son.  Addressed the interactions and how they impacted pt.  Pt feels the disconnect  from her parents is a way for her to take care of herself.   Provided supportive therapy.     Interventions: Cognitive Behavioral Therapy and Insight-Oriented  Diagnosis:  F43.22  Plan of Care: Recommend ongoing therapy.  Pt participated in setting treatment goals.  Pt wants to improve coping skills and relationships.  Pt wants to have a safe place to talk.  Plan to meet every two weeks.   Treatment Plan Client Abilities/Strengths  Pt is bright, engaging and motivated for therapy.  Client Treatment Preferences  Individual therapy.  Client Statement of Needs  Improve coping skills and relationships. Symptoms  Autonomic hyperactivity (e.g., palpitations, shortness of breath, dry mouth, trouble swallowing, nausea, diarrhea). Excessive and/or unrealistic worry that is difficult to control occurring more days than not for at least 6 months about a number of events or activities. Hypervigilance (e.g., feeling constantly on edge, experiencing concentration difficulties, having trouble falling or staying asleep, exhibiting a general state of irritability). Motor tension (e.g., restlessness, tiredness, shakiness, muscle tension). Problems Addressed  Anxiety Goals 1. Enhance ability to effectively cope with the full variety of life's worries and anxieties. 2. Learn and implement coping skills that result in a reduction of anxiety and worry, and improved daily functioning. Objective Learn to accept limitations in life and commit to tolerating, rather than avoiding, unpleasant emotions while accomplishing meaningful goals. Target Date: 2024-03-13 Frequency: Biweekly Progress: 10 Modality: individual Related Interventions 1. Use techniques from Acceptance and Commitment Therapy to help client  accept uncomfortable realities such as lack of complete control, imperfections, and uncertainty and tolerate unpleasant emotions and thoughts in order to accomplish value-consistent goals. Objective Learn and  implement problem-solving strategies for realistically addressing worries. Target Date: 2024-03-13 Frequency: Biweekly Progress: 10 Modality: individual Related Interventions 1. Assign the client a homework exercise in which he/she problem-solves a current problem.  review, reinforce success, and provide corrective feedback toward improvement. 2. Teach the client problem-solving strategies involving specifically defining a problem, generating options for addressing it, evaluating the pros and cons of each option, selecting and implementing an optional action, and reevaluating and refining the action. Objective Learn and implement calming skills to reduce overall anxiety and manage anxiety symptoms. Target Date: 2024-03-13 Frequency: Biweekly Progress: 10 Modality: individual Related Interventions 1. Assign the client to read about progressive muscle relaxation and other calming strategies in relevant books or treatment manuals (e.g., Progressive Relaxation Training by Robb Matar and Alen Blew; Mastery of Your Anxiety and Worry: Workbook by Earlie Counts). 2. Assign the client homework each session in which he/she practices relaxation exercises daily, gradually applying them progressively from non-anxiety-provoking to anxiety-provoking situations; review and reinforce success while providing corrective feedback toward improvement. 3. Teach the client calming/relaxation skills (e.g., applied relaxation, progressive muscle relaxation, cue controlled relaxation; mindful breathing; biofeedback) and how to discriminate better between relaxation and tension; teach the client how to apply these skills to his/her daily life. 3. Reduce overall frequency, intensity, and duration of the anxiety so that daily functioning is not impaired. 4. Resolve the core conflict that is the source of anxiety. 5. Stabilize anxiety level while increasing ability to function on a daily basis. Diagnosis :    F43.22 Conditions  For Discharge Achievement of treatment goals and objectives.  Ehsan Corvin, LCSW

## 2023-04-08 NOTE — Progress Notes (Unsigned)
GYNECOLOGY  VISIT   HPI: 52 y.o.   Married  [place]  female   [phone] with Patient's last menstrual period was 08/07/2015.   here for   4 week HRT. Pt wants to re-asses lump on left breast.  Feeling much better.  Still feels a little warm, but 90% good.  Sleeping at night. Still using a fan.   Notes some breast tingling.   Hx left breast cyst 1.9 x 1.3 x 1.8 cm on dx mammogram.  She had a follow up dx mammogram. and left breast US 1.4 x 2.4 x 2.9 cm.    GYNECOLOGIC HISTORY: Patient's last menstrual period was 08/07/2015. Contraception:  hysterectomy.  Ovaries remain.  Menopausal hormone therapy:  estradiol patch Last mammogram:  05/30/22 Breast Density Cat C, BI-RADS CAT 2 benign Last pap smear:   03-31-15 Neg:Neg HR HPV;  2014 neg         OB History     Gravida  5   Para  3   Term  3   Preterm      AB  2   Living  3      SAB  1   IAB  1   Ectopic      Multiple      Live Births                 Patient Active Problem List   Diagnosis Date Noted   Seasonal allergic rhinitis 12/12/2022   Hyperlipidemia 09/26/2022   Preventative health care 09/11/2021   Menopausal syndrome (hot flashes) 05/22/2021   Hot flashes due to menopause 04/24/2021   Crohn disease (HCC) 02/18/2017   History of uveitis 02/18/2017   Ankylosing spondylitis of multiple sites in spine (HCC) 01/29/2017   Encounter for long-term (current) use of high-risk medication 01/29/2017   Crohn's disease of both small and large intestine with complication (HCC) 11/01/2016   Elevated ferritin level 11/01/2016   History of colitis 03/29/2016   Elevated serum globulin level 12/29/2015   Depression 09/15/2015   Vitamin D deficiency 09/08/2015   Anemia 09/08/2015   Status post laparoscopic hysterectomy 08/21/2015   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/12/2014   Extrinsic asthma 07/12/2014   Iron deficiency anemia 07/11/2014   HLA B27 positive 07/06/2014   Low serum vitamin D     Non-celiac gluten sensitivity    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HCC)     Past Medical History:  Diagnosis Date   Abnormal CT of the abdomen    Abnormal uterine bleeding    Anemia    iron def   Ankylosing spondylitis (HCC)    Chicken pox    Colitis    Crohn disease (HCC)    Dysrhythmia    occassional fluttering noted by patient   Genital warts    Gluten intolerance    History of chlamydia    History of hysterectomy 08/21/2015   HLA B27 (HLA B27 positive)    HSV-1 (herpes simplex virus 1) infection    Hyperlipidemia    Low calcium levels    Low iron    Low serum vitamin D    Muscle spasms of both lower extremities    Spasms in lower back   Non-celiac gluten sensitivity    Reactive airway disease    Spondyloarthritis    genetic marker   UTI (lower urinary tract infection)     Past Surgical History:  Procedure Laterality Date   ABDOMINAL HYSTERECTOMY  08/21/15   BILATERAL SALPINGECTOMY Bilateral 08/21/2015  Procedure: BILATERAL SALPINGECTOMY;  Surgeon: Patton Salles, MD;  Location: [facility];  Service: Gynecology;  Laterality: Bilateral;   COLONOSCOPY     CYSTOSCOPY N/A 08/21/2015   Procedure: CYSTOSCOPY;  Surgeon: Patton Salles, MD;  Location: [facility];  Service: Gynecology;  Laterality: N/A;   MOUTH SURGERY     cyst in mouth   ROBOTIC ASSISTED TOTAL HYSTERECTOMY WITH SALPINGECTOMY Bilateral 08/21/2015   Procedure: ROBOTIC ASSISTED TOTAL HYSTERECTOMY ;  Surgeon: Patton Salles, MD;  Location: [facility];  Service: Gynecology;  Laterality: Bilateral;   WISDOM TOOTH EXTRACTION      Current Outpatient Medications  Medication Sig Dispense Refill   Calcium Carbonate-Vit D-Min (CALTRATE 600+D PLUS MINERALS) 600-800 MG-UNIT TABS One tab twice daily     estradiol (VIVELLE-DOT) 0.025 MG/24HR Place 1 patch onto the skin 2 (two) times a week. 8 patch 1   folic acid (FOLVITE) 1 MG tablet Take 1 tablet by mouth daily.     methotrexate (RHEUMATREX) 2.5 MG tablet  Take 2.5 mg by mouth once a week.     valACYclovir (VALTREX) 500 MG tablet Take one tab (500 mg) PO bid x 3 days prn for genital outbreak.  Take 4 tabs (2000 mg) orally every 12 hours for 24 hours for oral outbreak. 30 tablet 3   No current facility-administered medications for this visit.     ALLERGIES: Gluten meal, Tramadol, Codeine, and Peanut-containing drug products  Family History  Problem Relation Age of Onset   Hypertension Mother    Diabetes Mother    Anxiety disorder Mother    Diabetes Father    Colon polyps Father    Prostate cancer Father    Cancer Paternal Grandmother        ? type    Social History   Socioeconomic History   Marital status: Married    Spouse name: Not on file   Number of children: Not on file   Years of education: Not on file   Highest education level: Not on file  Occupational History   Not on file  Tobacco Use   Smoking status: Never   Smokeless tobacco: Never  Vaping Use   Vaping status: Never Used  Substance and Sexual Activity   Alcohol use: Yes    Alcohol/week: 4.0 - 5.0 standard drinks of alcohol    Types: 4 - 5 Glasses of wine per week   Drug use: No   Sexual activity: Yes    Partners: Male    Comment: Hyst  Other Topics Concern   Not on file  Social History Narrative   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 Jill Alexanders '95. [name].    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      Hobbies: rest, reading, cooking, acting, piano, kids   Social Determinants of Health   Financial Resource Strain: Not on file  Food Insecurity: Not on file  Transportation Needs: Not on file  Physical Activity: Not on file  Stress: Not on file  Social Connections: Unknown (02/05/2022)   [facility] from [facility]   Social Network    Social Network: Not on file  Intimate Partner Violence: Unknown (12/28/2021)    Received from [facility], [facility]   HITS    Physically Hurt: Not on file  Insult or Talk Down To: Not on file    Threaten Physical Harm: Not on file    Scream or Curse: Not on file    Review of Systems  All other systems reviewed and are negative.   PHYSICAL EXAMINATION:    BP 112/84 (BP Location: Left Arm, Patient Position: Sitting, Cuff Size: Normal)   Ht 5' 2.5" (1.588 m)   Wt 113 lb (51.3 kg)   LMP 08/07/2015   BMI 20.34 kg/m     General appearance: alert, cooperative and appears stated age Head: Normocephalic, without obvious abnormality, atraumatic Neck: no adenopathy, supple, symmetrical, trachea midline and thyroid normal to inspection and palpation Lungs: clear to auscultation bilaterally Breasts: normal appearance, no masses or tenderness, No nipple retraction or dimpling, No nipple discharge or bleeding, No axillary or supraclavicular adenopathy  Pelvic: External genitalia:  no lesions              Urethra:  normal appearing urethra with no masses, tenderness or lesions              Bartholins and Skenes: normal                 Vagina: normal appearing vagina with normal color and discharge, no lesions              Cervix: no lesions                 Chaperone was present for exam:  ***  ASSESSMENT  Left breast cyst - 3 cm.   PLAN     An After Visit Summary was printed and given to the patient.  ______ minutes face to face time of which over 50% was spent in counseling.

## 2023-04-14 ENCOUNTER — Encounter: Payer: Self-pay | Admitting: Obstetrics and Gynecology

## 2023-04-14 ENCOUNTER — Ambulatory Visit: Payer: Federal, State, Local not specified - PPO | Admitting: Obstetrics and Gynecology

## 2023-04-14 VITALS — BP 112/84 | Ht 62.5 in | Wt 113.0 lb

## 2023-04-14 DIAGNOSIS — Z79890 Hormone replacement therapy (postmenopausal): Secondary | ICD-10-CM | POA: Diagnosis not present

## 2023-04-14 DIAGNOSIS — N951 Menopausal and female climacteric states: Secondary | ICD-10-CM | POA: Diagnosis not present

## 2023-04-14 DIAGNOSIS — N6002 Solitary cyst of left breast: Secondary | ICD-10-CM | POA: Diagnosis not present

## 2023-04-14 MED ORDER — ESTRADIOL 0.025 MG/24HR TD PTTW: 1.0000 | MEDICATED_PATCH | TRANSDERMAL | 0 refills | Status: DC

## 2023-04-16 ENCOUNTER — Telehealth: Payer: Self-pay | Admitting: Obstetrics and Gynecology

## 2023-04-16 DIAGNOSIS — N6321 Unspecified lump in the left breast, upper outer quadrant: Secondary | ICD-10-CM

## 2023-04-16 DIAGNOSIS — N6002 Solitary cyst of left breast: Secondary | ICD-10-CM

## 2023-04-16 DIAGNOSIS — N6320 Unspecified lump in the left breast, unspecified quadrant: Secondary | ICD-10-CM

## 2023-04-16 NOTE — Telephone Encounter (Signed)
Please schedule dx left mammogram, left breast US, and possible [facility]   My patient has an enlarging left breast mass, 3 cm at 2:00.  Known left breast cyst.

## 2023-04-17 ENCOUNTER — Encounter: Payer: Self-pay | Admitting: Obstetrics and Gynecology

## 2023-04-17 NOTE — Telephone Encounter (Signed)
Spoke w/ Savanna @ [facility] and got pt scheduled for 04/28/2023 @ 210pm.  Pt notified per DVM per DPR. Will route to provider for final review and close.

## 2023-04-28 ENCOUNTER — Ambulatory Visit
Admission: RE | Admit: 2023-04-28 | Discharge: 2023-04-28 | Disposition: A | Discharge status: Home / Self Care | Payer: Federal, State, Local not specified - PPO | Source: Ambulatory Visit | Attending: Obstetrics and Gynecology | Admitting: Obstetrics and Gynecology

## 2023-04-28 DIAGNOSIS — N6002 Solitary cyst of left breast: Secondary | ICD-10-CM

## 2023-04-28 DIAGNOSIS — N6321 Unspecified lump in the left breast, upper outer quadrant: Secondary | ICD-10-CM

## 2023-04-28 DIAGNOSIS — N6320 Unspecified lump in the left breast, unspecified quadrant: Secondary | ICD-10-CM | POA: Diagnosis not present

## 2023-04-29 ENCOUNTER — Other Ambulatory Visit: Payer: Self-pay | Admitting: Obstetrics and Gynecology

## 2023-04-29 DIAGNOSIS — N6321 Unspecified lump in the left breast, upper outer quadrant: Secondary | ICD-10-CM

## 2023-04-29 DIAGNOSIS — N6002 Solitary cyst of left breast: Secondary | ICD-10-CM

## 2023-05-07 ENCOUNTER — Ambulatory Visit: Payer: Federal, State, Local not specified - PPO | Admitting: Psychology

## 2023-05-07 DIAGNOSIS — F4322 Adjustment disorder with anxiety: Secondary | ICD-10-CM | POA: Diagnosis not present

## 2023-05-07 NOTE — Progress Notes (Signed)
[facility] Counselor/Therapist Progress Note  Patient ID: Janice Zuniga, MRN: [id number],    Date: 05/07/2023  Time Spent: 3:00pm-3:55pm   55 minutes   Treatment Type: Individual Therapy  Reported Symptoms: stress  Mental Status Exam: Appearance:  Casual     Behavior: Appropriate  Motor: Normal  Speech/Language:  Normal Rate  Affect: Appropriate  Mood: normal  Thought process: normal  Thought content:   WNL  Sensory/Perceptual disturbances:   WNL  Orientation: oriented to person, place, time/date, and situation  Attention: Good  Concentration: Good  Memory: WNL  Fund of knowledge:  Good  Insight:   Good  Judgment:  Good  Impulse Control: Good   Risk Assessment: Danger to Self:  No Self-injurious Behavior: No Danger to Others: No Duty to Warn:no Physical Aggression / Violence:No  Access to Firearms a concern: No  Gang Involvement:No   Subjective: Pt present for face-to-face individual therapy via video.  Pt consents to telehealth video session and is aware of limitations of virtual sessions. Location of pt: home Location of therapist: home office.   Pt talked about the stress of trying to balance work and life.    Pt states she has been doing better managing her frustration and anger.   Pt feels she has been living her life under certain self imposed expectations.  At times pt questions the steps and choices she has made.  She puts pressure on herself to keep progressing in her career path whether she really wants a certain opportunity or not.  She is trying to give herself permission to decline leadership roles.  Pt has such high expectations of herself that she often feels like she is not good enough and worries too much about what others think about her.  Worked on how pt can release overthinking about what others think about her.   Pt talked about not having a voice in her family of origin and how that has affected her in adulthood.   Helped pt  process the issues and her feelings.   Provided supportive therapy.     Interventions: Cognitive Behavioral Therapy and Insight-Oriented  Diagnosis:  F43.22  Plan of Care: Recommend ongoing therapy.  Pt participated in setting treatment goals.  Pt wants to improve coping skills and relationships.  Pt wants to have a safe place to talk.  Plan to meet every two weeks.   Treatment Plan Client Abilities/Strengths  Pt is bright, engaging and motivated for therapy.  Client Treatment Preferences  Individual therapy.  Client Statement of Needs  Improve coping skills and relationships. Symptoms  Autonomic hyperactivity (e.g., palpitations, shortness of breath, dry mouth, trouble swallowing, nausea, diarrhea). Excessive and/or unrealistic worry that is difficult to control occurring more days than not for at least 6 months about a number of events or activities. Hypervigilance (e.g., feeling constantly on edge, experiencing concentration difficulties, having trouble falling or staying asleep, exhibiting a general state of irritability). Motor tension (e.g., restlessness, tiredness, shakiness, muscle tension). Problems Addressed  Anxiety Goals 1. Enhance ability to effectively cope with the full variety of life's worries and anxieties. 2. Learn and implement coping skills that result in a reduction of anxiety and worry, and improved daily functioning. Objective Learn to accept limitations in life and commit to tolerating, rather than avoiding, unpleasant emotions while accomplishing meaningful goals. Target Date: 2024-03-13 Frequency: Biweekly Progress: 10 Modality: individual Related Interventions 1. Use techniques from Acceptance and Commitment Therapy to help client accept uncomfortable realities such as lack of  complete control, imperfections, and uncertainty and tolerate unpleasant emotions and thoughts in order to accomplish value-consistent goals. Objective Learn and implement  problem-solving strategies for realistically addressing worries. Target Date: 2024-03-13 Frequency: Biweekly Progress: 10 Modality: individual Related Interventions 1. Assign the client a homework exercise in which he/she problem-solves a current problem.  review, reinforce success, and provide corrective feedback toward improvement. 2. Teach the client problem-solving strategies involving specifically defining a problem, generating options for addressing it, evaluating the pros and cons of each option, selecting and implementing an optional action, and reevaluating and refining the action. Objective Learn and implement calming skills to reduce overall anxiety and manage anxiety symptoms. Target Date: 2024-03-13 Frequency: Biweekly Progress: 10 Modality: individual Related Interventions 1. Assign the client to read about progressive muscle relaxation and other calming strategies in relevant books or treatment manuals (e.g., Progressive Relaxation Training by Robb Matar and Alen Blew; Mastery of Your Anxiety and Worry: Workbook by Earlie Counts). 2. Assign the client homework each session in which he/she practices relaxation exercises daily, gradually applying them progressively from non-anxiety-provoking to anxiety-provoking situations; review and reinforce success while providing corrective feedback toward improvement. 3. Teach the client calming/relaxation skills (e.g., applied relaxation, progressive muscle relaxation, cue controlled relaxation; mindful breathing; biofeedback) and how to discriminate better between relaxation and tension; teach the client how to apply these skills to his/her daily life. 3. Reduce overall frequency, intensity, and duration of the anxiety so that daily functioning is not impaired. 4. Resolve the core conflict that is the source of anxiety. 5. Stabilize anxiety level while increasing ability to function on a daily basis. Diagnosis :    F43.22 Conditions For  Discharge Achievement of treatment goals and objectives.  Jermery Caratachea, LCSW

## 2023-05-08 ENCOUNTER — Ambulatory Visit
Admission: RE | Admit: 2023-05-08 | Discharge: 2023-05-08 | Disposition: A | Discharge status: Home / Self Care | Payer: Federal, State, Local not specified - PPO | Source: Ambulatory Visit | Attending: Obstetrics and Gynecology | Admitting: Obstetrics and Gynecology

## 2023-05-08 DIAGNOSIS — N6002 Solitary cyst of left breast: Secondary | ICD-10-CM

## 2023-05-08 DIAGNOSIS — N6321 Unspecified lump in the left breast, upper outer quadrant: Secondary | ICD-10-CM

## 2023-05-22 ENCOUNTER — Ambulatory Visit (INDEPENDENT_AMBULATORY_CARE_PROVIDER_SITE_OTHER): Payer: Federal, State, Local not specified - PPO | Admitting: Psychology

## 2023-05-22 DIAGNOSIS — F4322 Adjustment disorder with anxiety: Secondary | ICD-10-CM

## 2023-05-22 NOTE — Progress Notes (Signed)
[facility] Counselor/Therapist Progress Note  Patient ID: Janice Zuniga, MRN: [id number],    Date: 05/22/2023  Time Spent: 4:00pm-4:55pm   55 minutes   Treatment Type: Individual Therapy  Reported Symptoms: stress  Mental Status Exam: Appearance:  Casual     Behavior: Appropriate  Motor: Normal  Speech/Language:  Normal Rate  Affect: Appropriate  Mood: normal  Thought process: normal  Thought content:   WNL  Sensory/Perceptual disturbances:   WNL  Orientation: oriented to person, place, time/date, and situation  Attention: Good  Concentration: Good  Memory: WNL  Fund of knowledge:  Good  Insight:   Good  Judgment:  Good  Impulse Control: Good   Risk Assessment: Danger to Self:  No Self-injurious Behavior: No Danger to Others: No Duty to Warn:no Physical Aggression / Violence:No  Access to Firearms a concern: No  Gang Involvement:No   Subjective: Pt present for face-to-face individual therapy via video.  Pt consents to telehealth video session and is aware of limitations and benefits of virtual sessions. Location of pt: home Location of therapist: home office.   Pt talked about working on utilizing the strategies we talked about last therapy session.   It has helped her manage her intrusive thoughts about worrying what others think about her.  Worked on additional ways pt can release overthinking about what others think about her.  Pt talked about the hopes and goals she has for her life.  Pt wants to retire from the government in 2030.   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    Addressed how pt can work toward those goals.  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   Provided supportive therapy.     Interventions: Cognitive Behavioral Therapy and Insight-Oriented  Diagnosis:  F43.22  Plan of Care: Recommend ongoing therapy.  Pt participated in setting treatment goals.  Pt wants to improve coping skills and  relationships.  Pt wants to have a safe place to talk.  Plan to meet every two weeks.   Treatment Plan Client Abilities/Strengths  Pt is bright, engaging and motivated for therapy.  Client Treatment Preferences  Individual therapy.  Client Statement of Needs  Improve coping skills and relationships. Symptoms  Autonomic hyperactivity (e.g., palpitations, shortness of breath, dry mouth, trouble swallowing, nausea, diarrhea). Excessive and/or unrealistic worry that is difficult to control occurring more days than not for at least 6 months about a number of events or activities. Hypervigilance (e.g., feeling constantly on edge, experiencing concentration difficulties, having trouble falling or staying asleep, exhibiting a general state of irritability). Motor tension (e.g., restlessness, tiredness, shakiness, muscle tension). Problems Addressed  Anxiety Goals 1. Enhance ability to effectively cope with the full variety of life's worries and anxieties. 2. Learn and implement coping skills that result in a reduction of anxiety and worry, and improved daily functioning. Objective Learn to accept limitations in life and commit to tolerating, rather than avoiding, unpleasant emotions while accomplishing meaningful goals. Target Date: 2024-03-13 Frequency: Biweekly Progress: 10 Modality: individual Related Interventions 1. Use techniques from Acceptance and Commitment Therapy to help client accept uncomfortable realities such as lack of complete control, imperfections, and uncertainty and tolerate unpleasant emotions and thoughts in order to accomplish value-consistent goals. Objective Learn and implement problem-solving strategies for realistically addressing worries. Target Date: 2024-03-13 Frequency: Biweekly Progress: 10 Modality: individual Related Interventions 1. Assign the client a homework exercise in which he/she problem-solves a current problem.  review, reinforce success, and  provide  corrective feedback toward improvement. 2. Teach the client problem-solving strategies involving specifically defining a problem, generating options for addressing it, evaluating the pros and cons of each option, selecting and implementing an optional action, and reevaluating and refining the action. Objective Learn and implement calming skills to reduce overall anxiety and manage anxiety symptoms. Target Date: 2024-03-13 Frequency: Biweekly Progress: 10 Modality: individual Related Interventions 1. Assign the client to read about progressive muscle relaxation and other calming strategies in relevant books or treatment manuals (e.g., Progressive Relaxation Training by Robb Matar and Alen Blew; Mastery of Your Anxiety and Worry: Workbook by Earlie Counts). 2. Assign the client homework each session in which he/she practices relaxation exercises daily, gradually applying them progressively from non-anxiety-provoking to anxiety-provoking situations; review and reinforce success while providing corrective feedback toward improvement. 3. Teach the client calming/relaxation skills (e.g., applied relaxation, progressive muscle relaxation, cue controlled relaxation; mindful breathing; biofeedback) and how to discriminate better between relaxation and tension; teach the client how to apply these skills to his/her daily life. 3. Reduce overall frequency, intensity, and duration of the anxiety so that daily functioning is not impaired. 4. Resolve the core conflict that is the source of anxiety. 5. Stabilize anxiety level while increasing ability to function on a daily basis. Diagnosis :    F43.22 Conditions For Discharge Achievement of treatment goals and objectives.  Elfrida Pixley, LCSW

## 2023-06-05 ENCOUNTER — Ambulatory Visit (INDEPENDENT_AMBULATORY_CARE_PROVIDER_SITE_OTHER): Payer: Federal, State, Local not specified - PPO | Admitting: Psychology

## 2023-06-05 DIAGNOSIS — F4322 Adjustment disorder with anxiety: Secondary | ICD-10-CM | POA: Diagnosis not present

## 2023-06-05 NOTE — Progress Notes (Signed)
[facility] Counselor/Therapist Progress Note  Patient ID: Janice Zuniga, MRN: [id number],    Date: 06/05/2023  Time Spent: 4:00pm-4:55pm   55 minutes   Treatment Type: Individual Therapy  Reported Symptoms: stress  Mental Status Exam: Appearance:  Casual     Behavior: Appropriate  Motor: Normal  Speech/Language:  Normal Rate  Affect: Appropriate  Mood: normal  Thought process: normal  Thought content:   WNL  Sensory/Perceptual disturbances:   WNL  Orientation: oriented to person, place, time/date, and situation  Attention: Good  Concentration: Good  Memory: WNL  Fund of knowledge:  Good  Insight:   Good  Judgment:  Good  Impulse Control: Good   Risk Assessment: Danger to Self:  No Self-injurious Behavior: No Danger to Others: No Duty to Warn:no Physical Aggression / Violence:No  Access to Firearms a concern: No  Gang Involvement:No   Subjective: Pt present for face-to-face individual therapy via video.  Pt consents to telehealth video session and is aware of limitations and benefits of virtual sessions. Location of pt: home Location of therapist: home office.   Pt talked about worrying about what other people think.   She has a "nosy neighbor" who makes comments about what pt does.  Pt feels like she is being watched at times.  This upsets pt.  Addressed how pt can set healthy boundaries with her neighbors.  Pt tends to ruminate at times about the things her neighbors say.  Worked on coping strategies.  Pt talked about her goals to have good work/life balance.  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  Provided supportive therapy.     Interventions: Cognitive Behavioral Therapy and Insight-Oriented  Diagnosis:  F43.22  Plan of Care: Recommend ongoing therapy.  Pt participated in setting treatment goals.  Pt wants to improve coping skills and relationships.  Pt  wants to have a safe place to talk.  Plan to meet every two weeks.   Treatment Plan Client Abilities/Strengths  Pt is bright, engaging and motivated for therapy.  Client Treatment Preferences  Individual therapy.  Client Statement of Needs  Improve coping skills and relationships. Symptoms  Autonomic hyperactivity (e.g., palpitations, shortness of breath, dry mouth, trouble swallowing, nausea, diarrhea). Excessive and/or unrealistic worry that is difficult to control occurring more days than not for at least 6 months about a number of events or activities. Hypervigilance (e.g., feeling constantly on edge, experiencing concentration difficulties, having trouble falling or staying asleep, exhibiting a general state of irritability). Motor tension (e.g., restlessness, tiredness, shakiness, muscle tension). Problems Addressed  Anxiety Goals 1. Enhance ability to effectively cope with the full variety of life's worries and anxieties. 2. Learn and implement coping skills that result in a reduction of anxiety and worry, and improved daily functioning. Objective Learn to accept limitations in life and commit to tolerating, rather than avoiding, unpleasant emotions while accomplishing meaningful goals. Target Date: 2024-03-13 Frequency: Biweekly Progress: 10 Modality: individual Related Interventions 1. Use techniques from Acceptance and Commitment Therapy to help client accept uncomfortable realities such as lack of complete control, imperfections, and uncertainty and tolerate unpleasant emotions and thoughts in order to accomplish value-consistent goals. Objective Learn and implement problem-solving strategies for realistically addressing worries. Target Date: 2024-03-13 Frequency: Biweekly Progress: 10 Modality: individual Related Interventions 1. Assign the client a homework exercise in which he/she problem-solves a current problem.  review, reinforce success, and provide corrective  feedback  toward improvement. 2. Teach the client problem-solving strategies involving specifically defining a problem, generating options for addressing it, evaluating the pros and cons of each option, selecting and implementing an optional action, and reevaluating and refining the action. Objective Learn and implement calming skills to reduce overall anxiety and manage anxiety symptoms. Target Date: 2024-03-13 Frequency: Biweekly Progress: 10 Modality: individual Related Interventions 1. Assign the client to read about progressive muscle relaxation and other calming strategies in relevant books or treatment manuals (e.g., Progressive Relaxation Training by Robb Matar and Alen Blew; Mastery of Your Anxiety and Worry: Workbook by Earlie Counts). 2. Assign the client homework each session in which he/she practices relaxation exercises daily, gradually applying them progressively from non-anxiety-provoking to anxiety-provoking situations; review and reinforce success while providing corrective feedback toward improvement. 3. Teach the client calming/relaxation skills (e.g., applied relaxation, progressive muscle relaxation, cue controlled relaxation; mindful breathing; biofeedback) and how to discriminate better between relaxation and tension; teach the client how to apply these skills to his/her daily life. 3. Reduce overall frequency, intensity, and duration of the anxiety so that daily functioning is not impaired. 4. Resolve the core conflict that is the source of anxiety. 5. Stabilize anxiety level while increasing ability to function on a daily basis. Diagnosis :    F43.22 Conditions For Discharge Achievement of treatment goals and objectives.  Kazuki Ingle, LCSW

## 2023-06-11 DIAGNOSIS — Z79899 Other long term (current) drug therapy: Secondary | ICD-10-CM | POA: Diagnosis not present

## 2023-06-11 DIAGNOSIS — M450 Ankylosing spondylitis of multiple sites in spine: Secondary | ICD-10-CM | POA: Diagnosis not present

## 2023-06-11 DIAGNOSIS — K5010 Crohn's disease of large intestine without complications: Secondary | ICD-10-CM | POA: Diagnosis not present

## 2023-06-11 DIAGNOSIS — M8589 Other specified disorders of bone density and structure, multiple sites: Secondary | ICD-10-CM | POA: Diagnosis not present

## 2023-06-18 ENCOUNTER — Encounter: Payer: Self-pay | Admitting: Physician Assistant

## 2023-06-18 ENCOUNTER — Ambulatory Visit: Payer: Federal, State, Local not specified - PPO | Admitting: Physician Assistant

## 2023-06-18 VITALS — BP 108/64 | HR 63 | Temp 97.7°F | Resp 16 | Ht 62.5 in | Wt 114.1 lb

## 2023-06-18 DIAGNOSIS — R202 Paresthesia of skin: Secondary | ICD-10-CM | POA: Diagnosis not present

## 2023-06-18 DIAGNOSIS — R5383 Other fatigue: Secondary | ICD-10-CM

## 2023-06-18 NOTE — Progress Notes (Signed)
Established patient visit   Patient: Janice Zuniga   DOB: Feb 25, 1971   52 y.o. Female  MRN: [id number] Visit Date: 06/18/2023  Today's healthcare provider: Alfredia Ferguson, PA-C  Cc. Fatigue, tingling L foot  Subjective    HPI  Discussed the use of AI scribe software for clinical note transcription with the patient, who gave verbal consent to proceed.  History of Present Illness   The patient, with a history of autoimmune conditions including Crohn's disease and ankylosing spondylitis, presents with fatigue and tingling in the left foot. The fatigue has been severe, to the point of needing to sit down while cooking and feeling exhausted even after a full night's sleep. This has just been for the last week.    The tingling in the left foot has been evaluated by a podiatrist, who suggested it might be nerve-related. he patient notes that the tingling is localized to the top of the foot and sometimes extends up the leg, but does not go beyond that. The patient has been physically active, often using a treadmill at a high incline for 45 minutes during cardio days, which may be contributing to the foot symptoms.  The patient has also been managing her diet to lower cholesterol levels, eating three meals a day and cutting back on snacks. She does not track her calorie intake but is conscious of her food choices. She recently had lab work done, which showed slightly low glucose and slightly high albumin levels. The patient is concerned about these results, especially given her parents' history of low blood sugar issues.       Medications: Outpatient Medications Prior to Visit  Medication Sig   Calcium Carbonate-Vit D-Min (CALTRATE 600+D PLUS MINERALS) 600-800 MG-UNIT TABS One tab twice daily   estradiol (VIVELLE-DOT) 0.025 MG/24HR Place 1 patch onto the skin 2 (two) times a week.   folic acid (FOLVITE) 1 MG tablet Take 1 tablet by mouth daily.   methotrexate (RHEUMATREX) 2.5 MG  tablet Take 2.5 mg by mouth once a week.   valACYclovir (VALTREX) 500 MG tablet Take one tab (500 mg) PO bid x 3 days prn for genital outbreak.  Take 4 tabs (2000 mg) orally every 12 hours for 24 hours for oral outbreak.   No facility-administered medications prior to visit.    Review of Systems  Constitutional:  Positive for fatigue. Negative for fever.  Respiratory:  Negative for cough and shortness of breath.   Cardiovascular:  Negative for chest pain and leg swelling.  Gastrointestinal:  Negative for abdominal pain.  Neurological:  Negative for dizziness and headaches.      Objective    BP 108/64   Pulse 63   Temp 97.7 F (36.5 C) (Oral)   Resp 16   Ht 5' 2.5" (1.588 m)   Wt 114 lb 2 oz (51.8 kg)   LMP 08/07/2015   SpO2 98%   BMI 20.54 kg/m Blood pressure 108/64, pulse 63, temperature 97.7 F (36.5 C), temperature source Oral, resp. rate 16, height 5' 2.5" (1.588 m), weight 114 lb 2 oz (51.8 kg), last menstrual period 08/07/2015, SpO2 98%.   Physical Exam Vitals reviewed.  Constitutional:      Appearance: She is not ill-appearing.  HENT:     Head: Normocephalic.  Eyes:     Conjunctiva/sclera: Conjunctivae normal.  Cardiovascular:     Rate and Rhythm: Normal rate.  Pulmonary:     Effort: Pulmonary effort is normal. No respiratory distress.  Neurological:     General: No focal deficit present.     Mental Status: She is alert and oriented to person, place, and time.  Psychiatric:        Mood and Affect: Mood normal.        Behavior: Behavior normal.     No results found for any visits on 06/18/23.  Assessment & Plan    1. Other fatigue -reviewed recent labs -Engage in self-care and rest as needed. Advised pt she has several autoimmune conditions, managed on a biologic, and her recovery may be extended compared to others. -It seems patient may be undereating and over exercising. Will monitor.  -Consider purchasing a glucometer to monitor blood glucose levels at  home if symptoms persist to determine if fatigue symptoms are 2/2 to hypoglycemia, given nonfasting glucose of 67 on recent labs.   2. Paresthesia of left lower extremity  -Modify treadmill routine to include less time at the highest incline.  Return if symptoms worsen or fail to improve.      I, Alfredia Ferguson, PA-C have reviewed all documentation for this visit. The documentation on  06/18/23   for the exam, diagnosis, procedures, and orders are all accurate and complete.    Alfredia Ferguson, PA-C  [facility] [phone] (phone) [phone] (fax)  [facility]

## 2023-06-19 ENCOUNTER — Ambulatory Visit: Payer: Federal, State, Local not specified - PPO | Admitting: Psychology

## 2023-07-04 ENCOUNTER — Other Ambulatory Visit: Payer: Self-pay

## 2023-07-04 ENCOUNTER — Other Ambulatory Visit: Payer: Self-pay | Admitting: Obstetrics and Gynecology

## 2023-07-04 DIAGNOSIS — N951 Menopausal and female climacteric states: Secondary | ICD-10-CM

## 2023-07-04 MED ORDER — ESTRADIOL 0.025 MG/24HR TD PTTW
1.0000 | MEDICATED_PATCH | TRANSDERMAL | 0 refills | Status: DC
Start: 2023-07-07 — End: 2023-07-09

## 2023-07-04 NOTE — Telephone Encounter (Signed)
Please contact patient to schedule her annual exam with me and have her update her routine screening mammogram.   I will refill her estradiol for 3 months to allow her time to schedule both appointments.

## 2023-07-04 NOTE — Progress Notes (Signed)
Refill of Vivelle Dot for 3 months.

## 2023-07-04 NOTE — Telephone Encounter (Signed)
Med refill request: estraidol 0.025mg  #24 Last OV: 04/14/23 Last AEX: 05/06/22 Next AEX: none scheduled Last MMG (if hormonal med) 04/28/23 Refill denied, needs AEX.  Sent to provider for review.

## 2023-07-07 ENCOUNTER — Ambulatory Visit (INDEPENDENT_AMBULATORY_CARE_PROVIDER_SITE_OTHER): Payer: Federal, State, Local not specified - PPO | Admitting: Psychology

## 2023-07-07 DIAGNOSIS — F4322 Adjustment disorder with anxiety: Secondary | ICD-10-CM

## 2023-07-07 NOTE — Progress Notes (Signed)
[facility] Counselor/Therapist Progress Note  Patient ID: Diamone Whistler, MRN: [id number],    Date: 07/07/2023  Time Spent: 2:00pm-2:55pm   55 minutes   Treatment Type: Individual Therapy  Reported Symptoms: stress  Mental Status Exam: Appearance:  Casual     Behavior: Appropriate  Motor: Normal  Speech/Language:  Normal Rate  Affect: Appropriate  Mood: normal  Thought process: normal  Thought content:   WNL  Sensory/Perceptual disturbances:   WNL  Orientation: oriented to person, place, time/date, and situation  Attention: Good  Concentration: Good  Memory: WNL  Fund of knowledge:  Good  Insight:   Good  Judgment:  Good  Impulse Control: Good   Risk Assessment: Danger to Self:  No Self-injurious Behavior: No Danger to Others: No Duty to Warn:no Physical Aggression / Violence:No  Access to Firearms a concern: No  Gang Involvement:No   Subjective: Pt present for face-to-face individual therapy via video.  Pt consents to telehealth video session and is aware of limitations and benefits of virtual sessions. Location of pt: home Location of therapist: home office.   Pt talked about noticing that she is determined and motivated.  Pt is driven to research and tends to look ahead.   A person on pt's team gave pt pushback and pt noticed she felt angry bc she feels like she was doing things right.   Pt then started to shut down.  At other times she can tend to argue her point until she "gets her way".   Pt is very thorough and likes to take the lead and doesn't like it when others do not support her ideas and direction.   Pt states she can come off as "direct or aggressive" at times.   Addressed pt's communication style and how she can adjust it in certain situations so it will serve her well.  Addressed communication patterns with her husband.  Helped pt process her feelings and relationship dynamics.  Provided supportive therapy.     Interventions:  Cognitive Behavioral Therapy and Insight-Oriented  Diagnosis:  F43.22  Plan of Care: Recommend ongoing therapy.  Pt participated in setting treatment goals.  Pt wants to improve coping skills and relationships.  Pt wants to have a safe place to talk.  Plan to meet every two weeks.   Treatment Plan Client Abilities/Strengths  Pt is bright, engaging and motivated for therapy.  Client Treatment Preferences  Individual therapy.  Client Statement of Needs  Improve coping skills and relationships. Symptoms  Autonomic hyperactivity (e.g., palpitations, shortness of breath, dry mouth, trouble swallowing, nausea, diarrhea). Excessive and/or unrealistic worry that is difficult to control occurring more days than not for at least 6 months about a number of events or activities. Hypervigilance (e.g., feeling constantly on edge, experiencing concentration difficulties, having trouble falling or staying asleep, exhibiting a general state of irritability). Motor tension (e.g., restlessness, tiredness, shakiness, muscle tension). Problems Addressed  Anxiety Goals 1. Enhance ability to effectively cope with the full variety of life's worries and anxieties. 2. Learn and implement coping skills that result in a reduction of anxiety and worry, and improved daily functioning. Objective Learn to accept limitations in life and commit to tolerating, rather than avoiding, unpleasant emotions while accomplishing meaningful goals. Target Date: 2024-03-13 Frequency: Biweekly Progress: 10 Modality: individual Related Interventions 1. Use techniques from Acceptance and Commitment Therapy to help client accept uncomfortable realities such as lack of complete control, imperfections, and uncertainty and tolerate unpleasant emotions and thoughts in order to accomplish  value-consistent goals. Objective Learn and implement problem-solving strategies for realistically addressing worries. Target Date: 2024-03-13 Frequency:  Biweekly Progress: 10 Modality: individual Related Interventions 1. Assign the client a homework exercise in which he/she problem-solves a current problem.  review, reinforce success, and provide corrective feedback toward improvement. 2. Teach the client problem-solving strategies involving specifically defining a problem, generating options for addressing it, evaluating the pros and cons of each option, selecting and implementing an optional action, and reevaluating and refining the action. Objective Learn and implement calming skills to reduce overall anxiety and manage anxiety symptoms. Target Date: 2024-03-13 Frequency: Biweekly Progress: 10 Modality: individual Related Interventions 1. Assign the client to read about progressive muscle relaxation and other calming strategies in relevant books or treatment manuals (e.g., Progressive Relaxation Training by Robb Matar and Alen Blew; Mastery of Your Anxiety and Worry: Workbook by Earlie Counts). 2. Assign the client homework each session in which he/she practices relaxation exercises daily, gradually applying them progressively from non-anxiety-provoking to anxiety-provoking situations; review and reinforce success while providing corrective feedback toward improvement. 3. Teach the client calming/relaxation skills (e.g., applied relaxation, progressive muscle relaxation, cue controlled relaxation; mindful breathing; biofeedback) and how to discriminate better between relaxation and tension; teach the client how to apply these skills to his/her daily life. 3. Reduce overall frequency, intensity, and duration of the anxiety so that daily functioning is not impaired. 4. Resolve the core conflict that is the source of anxiety. 5. Stabilize anxiety level while increasing ability to function on a daily basis. Diagnosis :    F43.22 Conditions For Discharge Achievement of treatment goals and objectives.  Tiara Maultsby, LCSW

## 2023-07-09 ENCOUNTER — Other Ambulatory Visit: Payer: Self-pay

## 2023-07-09 DIAGNOSIS — N951 Menopausal and female climacteric states: Secondary | ICD-10-CM

## 2023-07-09 MED ORDER — ESTRADIOL 0.025 MG/24HR TD PTTW
1.0000 | MEDICATED_PATCH | TRANSDERMAL | 0 refills | Status: DC
Start: 2023-07-10 — End: 2023-09-29

## 2023-07-09 NOTE — Telephone Encounter (Signed)
Med refill request: estradiol 0.025mg  patch #24 Last AEX: 05/06/22 Last ov: 04/14/23 Next AEX: none scheduled Last MMG (if hormonal med) 04/28/23 Patient changed pharmacy from [facility] to [facility].  [facility] contacted and cancelled RX for estradiol 0.025mg  patch.  RX pended to [facility].  Sent to Provider for approval.

## 2023-07-10 ENCOUNTER — Telehealth: Payer: Self-pay | Admitting: *Deleted

## 2023-07-10 NOTE — Telephone Encounter (Signed)
Med refill request:estradiol 0.025 mg patch Last AEX: 05/06/22 -BS Next AEX: 10/29/23 -BS Last MMG (if hormonal med) Dx L breast MMG, [place] and aspiration. Screening MMG due 05/2023  Spoke with patient. Rx sent on 07/09/23.  Advised will need updated screening MMG for future refills. Patient will call pharmacy to f/u on refill and TBC to schedule MMG.   No additional refill sent.   Routing to provider for final review. Patient is agreeable to disposition. Will close encounter.

## 2023-07-15 ENCOUNTER — Other Ambulatory Visit: Payer: Self-pay | Admitting: Family

## 2023-07-15 DIAGNOSIS — Z1231 Encounter for screening mammogram for malignant neoplasm of breast: Secondary | ICD-10-CM

## 2023-07-29 ENCOUNTER — Ambulatory Visit: Payer: Federal, State, Local not specified - PPO | Admitting: Psychology

## 2023-08-01 ENCOUNTER — Ambulatory Visit
Admission: RE | Admit: 2023-08-01 | Discharge: 2023-08-01 | Disposition: A | Discharge status: Home / Self Care | Payer: Federal, State, Local not specified - PPO | Source: Ambulatory Visit

## 2023-08-01 DIAGNOSIS — Z1231 Encounter for screening mammogram for malignant neoplasm of breast: Secondary | ICD-10-CM | POA: Diagnosis not present

## 2023-08-08 DIAGNOSIS — M8589 Other specified disorders of bone density and structure, multiple sites: Secondary | ICD-10-CM | POA: Diagnosis not present

## 2023-08-19 ENCOUNTER — Ambulatory Visit: Payer: Federal, State, Local not specified - PPO | Admitting: Psychology

## 2023-08-19 DIAGNOSIS — F4322 Adjustment disorder with anxiety: Secondary | ICD-10-CM | POA: Diagnosis not present

## 2023-08-19 NOTE — Progress Notes (Signed)
[facility] Counselor/Therapist Progress Note  Patient ID: Mavi Billiot, MRN: [id number],    Date: 08/19/2023  Time Spent: 4:00pm-4:55pm   55 minutes   Treatment Type: Individual Therapy  Reported Symptoms: stress  Mental Status Exam: Appearance:  Casual     Behavior: Appropriate  Motor: Normal  Speech/Language:  Normal Rate  Affect: Appropriate  Mood: normal  Thought process: normal  Thought content:   WNL  Sensory/Perceptual disturbances:   WNL  Orientation: oriented to person, place, time/date, and situation  Attention: Good  Concentration: Good  Memory: WNL  Fund of knowledge:  Good  Insight:   Good  Judgment:  Good  Impulse Control: Good   Risk Assessment: Danger to Self:  No Self-injurious Behavior: No Danger to Others: No Duty to Warn:no Physical Aggression / Violence:No  Access to Firearms a concern: No  Gang Involvement:No   Subjective: Pt present for face-to-face individual therapy via video.  Pt consents to telehealth video session and is aware of limitations and benefits of virtual sessions. Location of pt: home Location of therapist: home office.   Pt talked about her daughter telling her that she thinks pt has ADHD.   Pt did some thinking about it and did some online assessments and thinks she fits some of the criteri for ADHD.   Pt can get easily distracted and has a hard time focusing.  Pt states she has a lot of thoughts in her head constantly.  Addressed how these ADHD traits impact her life.   She becomes frustrated at times with sifting through all the thoughts.   Worked on thought management and improving pt's communication in relationships and making accommodations for herself.    Provided supportive therapy.     Interventions: Cognitive Behavioral Therapy and Insight-Oriented  Diagnosis:  F43.22  Plan of Care: Recommend ongoing therapy.  Pt participated in setting treatment goals.  Pt wants to improve coping skills and  relationships.  Pt wants to have a safe place to talk.  Plan to meet every two weeks.   Treatment Plan Client Abilities/Strengths  Pt is bright, engaging and motivated for therapy.  Client Treatment Preferences  Individual therapy.  Client Statement of Needs  Improve coping skills and relationships. Symptoms  Autonomic hyperactivity (e.g., palpitations, shortness of breath, dry mouth, trouble swallowing, nausea, diarrhea). Excessive and/or unrealistic worry that is difficult to control occurring more days than not for at least 6 months about a number of events or activities. Hypervigilance (e.g., feeling constantly on edge, experiencing concentration difficulties, having trouble falling or staying asleep, exhibiting a general state of irritability). Motor tension (e.g., restlessness, tiredness, shakiness, muscle tension). Problems Addressed  Anxiety Goals 1. Enhance ability to effectively cope with the full variety of life's worries and anxieties. 2. Learn and implement coping skills that result in a reduction of anxiety and worry, and improved daily functioning. Objective Learn to accept limitations in life and commit to tolerating, rather than avoiding, unpleasant emotions while accomplishing meaningful goals. Target Date: 2024-03-13 Frequency: Biweekly Progress: 10 Modality: individual Related Interventions 1. Use techniques from Acceptance and Commitment Therapy to help client accept uncomfortable realities such as lack of complete control, imperfections, and uncertainty and tolerate unpleasant emotions and thoughts in order to accomplish value-consistent goals. Objective Learn and implement problem-solving strategies for realistically addressing worries. Target Date: 2024-03-13 Frequency: Biweekly Progress: 10 Modality: individual Related Interventions 1. Assign the client a homework exercise in which he/she problem-solves a current problem.  review, reinforce success, and provide  corrective feedback toward improvement. 2. Teach the client problem-solving strategies involving specifically defining a problem, generating options for addressing it, evaluating the pros and cons of each option, selecting and implementing an optional action, and reevaluating and refining the action. Objective Learn and implement calming skills to reduce overall anxiety and manage anxiety symptoms. Target Date: 2024-03-13 Frequency: Biweekly Progress: 10 Modality: individual Related Interventions 1. Assign the client to read about progressive muscle relaxation and other calming strategies in relevant books or treatment manuals (e.g., Progressive Relaxation Training by Robb Matar and Alen Blew; Mastery of Your Anxiety and Worry: Workbook by Earlie Counts). 2. Assign the client homework each session in which he/she practices relaxation exercises daily, gradually applying them progressively from non-anxiety-provoking to anxiety-provoking situations; review and reinforce success while providing corrective feedback toward improvement. 3. Teach the client calming/relaxation skills (e.g., applied relaxation, progressive muscle relaxation, cue controlled relaxation; mindful breathing; biofeedback) and how to discriminate better between relaxation and tension; teach the client how to apply these skills to his/her daily life. 3. Reduce overall frequency, intensity, and duration of the anxiety so that daily functioning is not impaired. 4. Resolve the core conflict that is the source of anxiety. 5. Stabilize anxiety level while increasing ability to function on a daily basis. Diagnosis :    F43.22 Conditions For Discharge Achievement of treatment goals and objectives.  Juan Olthoff, LCSW

## 2023-09-08 ENCOUNTER — Ambulatory Visit: Payer: Federal, State, Local not specified - PPO | Admitting: Psychology

## 2023-09-08 DIAGNOSIS — F4322 Adjustment disorder with anxiety: Secondary | ICD-10-CM | POA: Diagnosis not present

## 2023-09-08 NOTE — Progress Notes (Signed)
[facility] Counselor/Therapist Progress Note  Patient ID: Janice Zuniga, MRN: [id number],    Date: 09/08/2023  Time Spent: 3:00pm-3:55pm   55 minutes   Treatment Type: Individual Therapy  Reported Symptoms: stress  Mental Status Exam: Appearance:  Casual     Behavior: Appropriate  Motor: Normal  Speech/Language:  Normal Rate  Affect: Appropriate  Mood: normal  Thought process: normal  Thought content:   WNL  Sensory/Perceptual disturbances:   WNL  Orientation: oriented to person, place, time/date, and situation  Attention: Good  Concentration: Good  Memory: WNL  Fund of knowledge:  Good  Insight:   Good  Judgment:  Good  Impulse Control: Good   Risk Assessment: Danger to Self:  No Self-injurious Behavior: No Danger to Others: No Duty to Warn:no Physical Aggression / Violence:No  Access to Firearms a concern: No    Gang Involvement:No   Subjective: Pt present for face-to-face individual therapy via video.  Pt consents to telehealth video session and is aware of limitations and benefits of virtual sessions. Location of pt: home Location of therapist: home office.   Pt talked about not having a good weekend this past weekend.   Pt and husband went out to dinner with another couple.   Pt's husband did not like something pt said about him in front of the other couple.  Pt had stated that her husband is very serious.  Pt felt badly that her husband did not like this and she got defensive.  Pt felt chastized by her husband.  Addressed their interactions.  Helped pt process her feelings and relationship dynamics.  Pt ended up shutting down on the weekend and was tearful.  Identified that pt globalized the issue when her husband was just giving her feedback about one thing she said. Addressed how pt's perfectionism was triggered in the interactions with her husband.    Provided supportive therapy.     Interventions: Cognitive Behavioral Therapy and  Insight-Oriented  Diagnosis:  F43.22  Plan of Care: Recommend ongoing therapy.  Pt participated in setting treatment goals.  Pt wants to improve coping skills and relationships.  Pt wants to have a safe place to talk.  Plan to meet every two weeks.   Treatment Plan Client Abilities/Strengths  Pt is bright, engaging and motivated for therapy.  Client Treatment Preferences  Individual therapy.  Client Statement of Needs  Improve coping skills and relationships. Symptoms  Autonomic hyperactivity (e.g., palpitations, shortness of breath, dry mouth, trouble swallowing, nausea, diarrhea). Excessive and/or unrealistic worry that is difficult to control occurring more days than not for at least 6 months about a number of events or activities. Hypervigilance (e.g., feeling constantly on edge, experiencing concentration difficulties, having trouble falling or staying asleep, exhibiting a general state of irritability). Motor tension (e.g., restlessness, tiredness, shakiness, muscle tension). Problems Addressed  Anxiety Goals 1. Enhance ability to effectively cope with the full variety of life's worries and anxieties. 2. Learn and implement coping skills that result in a reduction of anxiety and worry, and improved daily functioning. Objective Learn to accept limitations in life and commit to tolerating, rather than avoiding, unpleasant emotions while accomplishing meaningful goals. Target Date: 2024-03-13 Frequency: Biweekly Progress: 10 Modality: individual Related Interventions 1. Use techniques from Acceptance and Commitment Therapy to help client accept uncomfortable realities such as lack of complete control, imperfections, and uncertainty and tolerate unpleasant emotions and thoughts in order to accomplish value-consistent goals. Objective Learn and implement problem-solving strategies for realistically addressing worries.  Target Date: 2024-03-13 Frequency: Biweekly Progress: 10 Modality:  individual Related Interventions 1. Assign the client a homework exercise in which he/she problem-solves a current problem.  review, reinforce success, and provide corrective feedback toward improvement. 2. Teach the client problem-solving strategies involving specifically defining a problem, generating options for addressing it, evaluating the pros and cons of each option, selecting and implementing an optional action, and reevaluating and refining the action. Objective Learn and implement calming skills to reduce overall anxiety and manage anxiety symptoms. Target Date: 2024-03-13 Frequency: Biweekly Progress: 10 Modality: individual Related Interventions 1. Assign the client to read about progressive muscle relaxation and other calming strategies in relevant books or treatment manuals (e.g., Progressive Relaxation Training by Robb Matar and Alen Blew; Mastery of Your Anxiety and Worry: Workbook by Earlie Counts). 2. Assign the client homework each session in which he/she practices relaxation exercises daily, gradually applying them progressively from non-anxiety-provoking to anxiety-provoking situations; review and reinforce success while providing corrective feedback toward improvement. 3. Teach the client calming/relaxation skills (e.g., applied relaxation, progressive muscle relaxation, cue controlled relaxation; mindful breathing; biofeedback) and how to discriminate better between relaxation and tension; teach the client how to apply these skills to his/her daily life. 3. Reduce overall frequency, intensity, and duration of the anxiety so that daily functioning is not impaired. 4. Resolve the core conflict that is the source of anxiety. 5. Stabilize anxiety level while increasing ability to function on a daily basis. Diagnosis :    F43.22 Conditions For Discharge Achievement of treatment goals and objectives.  Kaitlynn Tramontana, LCSW

## 2023-09-12 DIAGNOSIS — Z23 Encounter for immunization: Secondary | ICD-10-CM | POA: Diagnosis not present

## 2023-09-12 DIAGNOSIS — M450 Ankylosing spondylitis of multiple sites in spine: Secondary | ICD-10-CM | POA: Diagnosis not present

## 2023-09-12 DIAGNOSIS — Z79899 Other long term (current) drug therapy: Secondary | ICD-10-CM | POA: Diagnosis not present

## 2023-09-12 DIAGNOSIS — H209 Unspecified iridocyclitis: Secondary | ICD-10-CM | POA: Diagnosis not present

## 2023-09-12 DIAGNOSIS — D84821 Immunodeficiency due to drugs: Secondary | ICD-10-CM | POA: Diagnosis not present

## 2023-09-12 DIAGNOSIS — K50819 Crohn's disease of both small and large intestine with unspecified complications: Secondary | ICD-10-CM | POA: Diagnosis not present

## 2023-09-12 DIAGNOSIS — Z8669 Personal history of other diseases of the nervous system and sense organs: Secondary | ICD-10-CM | POA: Diagnosis not present

## 2023-09-26 ENCOUNTER — Encounter: Payer: Federal, State, Local not specified - PPO | Admitting: Family

## 2023-09-29 ENCOUNTER — Other Ambulatory Visit: Payer: Self-pay

## 2023-09-29 ENCOUNTER — Ambulatory Visit: Payer: Federal, State, Local not specified - PPO | Admitting: Psychology

## 2023-09-29 DIAGNOSIS — F4322 Adjustment disorder with anxiety: Secondary | ICD-10-CM | POA: Diagnosis not present

## 2023-09-29 DIAGNOSIS — N951 Menopausal and female climacteric states: Secondary | ICD-10-CM

## 2023-09-29 MED ORDER — ESTRADIOL 0.025 MG/24HR TD PTTW: 1.0000 | MEDICATED_PATCH | TRANSDERMAL | 0 refills | Status: DC

## 2023-09-29 NOTE — Progress Notes (Signed)
 [facility] Counselor/Therapist Progress Note  Patient ID: Janice Zuniga, MRN: [id number],    Date: 09/29/2023  Time Spent: 4:00pm-4:55pm   55 minutes   Treatment Type: Individual Therapy  Reported Symptoms: stress  Mental Status Exam: Appearance:  Casual     Behavior: Appropriate  Motor: Normal  Speech/Language:  Normal Rate  Affect: Appropriate  Mood: normal  Thought process: normal  Thought content:   WNL  Sensory/Perceptual disturbances:   WNL  Orientation: oriented to person, place, time/date, and situation  Attention: Good  Concentration: Good  Memory: WNL  Fund of knowledge:  Good  Insight:   Good  Judgment:  Good  Impulse Control: Good   Risk Assessment: Danger to Self:  No Self-injurious Behavior: No Danger to Others: No Duty to Warn:no Physical Aggression / Violence:No  Access to Firearms a concern: No    Gang Involvement:No   Subjective: Pt present for face-to-face individual therapy via video.  Pt consents to telehealth video session and is aware of limitations and benefits of virtual sessions. Location of pt: home Location of therapist: home office.   Pt talked about the holidays.   She had 2 weeks off work and enjoyed them.      Pt talked about her goals for the new year.   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   Pt's mother in law is very sick.  She fell and broke her hip. And rehab did not take good care of her.  She ultimately ended up in Hospice.  She rebounded a little and has lived longer than expected.   Pt's mother in law is in [place]  so they are having to travel there.  Pt's husband does not have a good relationship with his mother's husband.  Addressed the family dynamics and how they impact pt.  Pt is working on being supportive but stepping back and having healthy boundaries.  Helped pt identify where she can let things go.   Pt talked about her [employer] business.  She wants to grow the business.  Addressed the  benefits and challenges of her business.  Provided supportive therapy.     Interventions: Cognitive Behavioral Therapy and Insight-Oriented  Diagnosis:  F43.22  Plan of Care: Recommend ongoing therapy.  Pt participated in setting treatment goals.  Pt wants to improve coping skills and relationships.  Pt wants to have a safe place to talk.  Plan to meet every two weeks.   Treatment Plan Client Abilities/Strengths  Pt is bright, engaging and motivated for therapy.  Client Treatment Preferences  Individual therapy.  Client Statement of Needs  Improve coping skills and relationships. Symptoms  Autonomic hyperactivity (e.g., palpitations, shortness of breath, dry mouth, trouble swallowing, nausea, diarrhea). Excessive and/or unrealistic worry that is difficult to control occurring more days than not for at least 6 months about a number of events or activities. Hypervigilance (e.g., feeling constantly on edge, experiencing concentration difficulties, having trouble falling or staying asleep, exhibiting a general state of irritability). Motor tension (e.g., restlessness, tiredness, shakiness, muscle tension). Problems Addressed  Anxiety Goals 1. Enhance ability to effectively cope with the full variety of life's worries and anxieties. 2. Learn and implement coping skills that result in a reduction of anxiety and worry, and improved daily functioning. Objective Learn to accept limitations in life and commit to tolerating, rather than avoiding, unpleasant emotions while accomplishing meaningful goals. Target Date: 2024-03-13 Frequency: Biweekly Progress: 10 Modality: individual Related Interventions 1. Use techniques from Acceptance and  Commitment Therapy to help client accept uncomfortable realities such as lack of complete control, imperfections, and uncertainty and tolerate unpleasant emotions and thoughts in order to accomplish value-consistent goals. Objective Learn and implement  problem-solving strategies for realistically addressing worries. Target Date: 2024-03-13 Frequency: Biweekly Progress: 10 Modality: individual Related Interventions 1. Assign the client a homework exercise in which he/she problem-solves a current problem.  review, reinforce success, and provide corrective feedback toward improvement. 2. Teach the client problem-solving strategies involving specifically defining a problem, generating options for addressing it, evaluating the pros and cons of each option, selecting and implementing an optional action, and reevaluating and refining the action. Objective Learn and implement calming skills to reduce overall anxiety and manage anxiety symptoms. Target Date: 2024-03-13 Frequency: Biweekly Progress: 10 Modality: individual Related Interventions 1. Assign the client to read about progressive muscle relaxation and other calming strategies in relevant books or treatment manuals (e.g., Progressive Relaxation Training by Thornell and Elmer; Mastery of Your Anxiety and Worry: Workbook by Richarda armin Given). 2. Assign the client homework each session in which he/she practices relaxation exercises daily, gradually applying them progressively from non-anxiety-provoking to anxiety-provoking situations; review and reinforce success while providing corrective feedback toward improvement. 3. Teach the client calming/relaxation skills (e.g., applied relaxation, progressive muscle relaxation, cue controlled relaxation; mindful breathing; biofeedback) and how to discriminate better between relaxation and tension; teach the client how to apply these skills to his/her daily life. 3. Reduce overall frequency, intensity, and duration of the anxiety so that daily functioning is not impaired. 4. Resolve the core conflict that is the source of anxiety. 5. Stabilize anxiety level while increasing ability to function on a daily basis. Diagnosis :    F43.22 Conditions For  Discharge Achievement of treatment goals and objectives.  Mendy Lapinsky, LCSW

## 2023-09-29 NOTE — Telephone Encounter (Signed)
 Med refill request: Estradiol  Last AEX: 05/06/2022 Next AEX: 10/29/2023 Last MMG (if hormonal med) 08/01/2023 Refill authorized: Last Rx sent #[phone] with zero refills on 07/10/2023. Please approve or deny as appropriate.

## 2023-10-03 ENCOUNTER — Ambulatory Visit (INDEPENDENT_AMBULATORY_CARE_PROVIDER_SITE_OTHER): Payer: Federal, State, Local not specified - PPO | Admitting: Family

## 2023-10-03 ENCOUNTER — Encounter: Payer: Self-pay | Admitting: Family

## 2023-10-03 VITALS — BP 128/75 | HR 56 | Temp 98.5°F | Resp 16 | Ht 62.5 in | Wt 112.0 lb

## 2023-10-03 DIAGNOSIS — Z0000 Encounter for general adult medical examination without abnormal findings: Secondary | ICD-10-CM

## 2023-10-03 NOTE — Patient Instructions (Signed)
 VISIT SUMMARY:  You came in today for your annual physical. You are following a healthy diet and regular exercise routine, and you have a colonoscopy scheduled for March. You received a flu shot earlier this year but have not yet received the latest COVID booster. You are taking Humira  for an inflammatory condition, which has been asymptomatic. You have some eye issues related to a tear duct and a bone density test showed osteopenia. You are maintaining your physical health and managing inflammation well.  YOUR PLAN:  -COLONOSCOPY: A colonoscopy is a procedure to examine the colon for any abnormalities. Your last colonoscopy was in 2017, and you have a new one scheduled for March 2025. Please continue with the scheduled colonoscopy.  -INFLUENZA VACCINATION: The flu shot helps protect against the influenza virus. You have received the flu shot for the current year. Please continue with annual influenza vaccinations.  -COVID-19 VACCINATION: The COVID-19 booster helps protect against the coronavirus. You have not received the latest COVID-19 booster. Please consider getting the booster at a local pharmacy, especially since you have a flight scheduled for next week.  -OSTEOPENIA: Osteopenia is a condition where bone density is lower than normal. You have been taking calcium supplements and maintaining regular exercise. Please continue taking calcium at 1200mg  daily and keep up with your regular exercise.  -EYE HEALTH: You have been experiencing issues with a tear duct, causing your eyes to water . You have a vision test scheduled. Please attend the scheduled vision test and discuss the tear duct issue with your optometrist.  -GENERAL HEALTH MAINTENANCE: You are maintaining a healthy diet and regular exercise routine. Recent lab results are normal. Please continue with your healthy diet and regular exercise regimen. Also, consider getting the COVID-19 booster and attend your scheduled vision test to  discuss the tear duct issue.  INSTRUCTIONS:  Please continue with your scheduled colonoscopy in March 2025. Consider getting the latest COVID-19 booster at a local pharmacy, especially since you have a flight next week. Attend your scheduled vision test and discuss the tear duct issue with your optometrist.

## 2023-10-03 NOTE — Progress Notes (Signed)
 Subjective:     Patient ID: Janice Zuniga, female    DOB: 1971/01/17, 53 y.o.   MRN: [id number]  Chief Complaint  Patient presents with   Annual Exam    HPI  Discussed the use of AI scribe software for clinical note transcription with the patient, who gave verbal consent to proceed.  History of Present Illness   The patient presents for an annual physical. She reports adherence to a healthy diet and regular exercise regimen, which includes a challenging six-week workout program. She admits to occasional indulgences, such as a piece of chocolate cake the previous night. She has been mindful of her cholesterol levels and is hopeful that her exercise routine is helping manage it.  The patient has a colonoscopy scheduled for March, following a recommendation from her GI doctor. She received a flu shot earlier in the year but has not yet received the latest COVID booster. She is considering getting the booster, especially as she has a flight scheduled for the following week.  The patient has been taking Humira  for Chrohn's disease, which has been asymptomatic since starting the medication. Her rheumatologist has suggested that she might be in remission and could potentially scale back on the Humira , depending on the results of the upcoming colonoscopy.  The patient has noticed some eye issues, specifically watering and a possible scab or nodule related to a previously identified tear duct issue. She has an eye exam scheduled for later in the day, which she may need to reschedule. She plans to discuss the eye issue at that appointment.  The patient also mentions a bone density test that showed osteopenia. She has been taking a liquid magnesium supplement recommended by a friend who is a pediatrician. She is committed to maintaining her current physical shape and managing any inflammation.  The patient reports no current cough or cold symptoms, no blurred vision, no joint pain, no frequent  headaches, and no concerns about depression or anxiety. She has had a hysterectomy, so there is no need for a Pap smear. Her last mammogram was in November. She has no urinary concerns or menopausal symptoms, thanks to an Estradiol  patch.     Immunizations: had a flu shot Diet: healthy Exercise: regular Colonoscopy: scheduled Pap Smear:  hysterectomy Mammogram:11/24 Vision: scheduled Dental: up to date     Health Maintenance Due  Topic Date Due   Hepatitis C Screening  Never done   Colonoscopy  06/28/2017   COVID-19 Vaccine (4 - 2024-25 season) 05/25/2023    Past Medical History:  Diagnosis Date   Abnormal CT of the abdomen    Abnormal uterine bleeding    Anemia    iron def   Ankylosing spondylitis (HCC)    Chicken pox    Colitis    Crohn disease (HCC)    Dysrhythmia    occassional fluttering noted by patient   Genital warts    Gluten intolerance    History of chlamydia    History of hysterectomy 08/21/2015   HLA B27 (HLA B27 positive)    HSV-1 (herpes simplex virus 1) infection    Hyperlipidemia    Low calcium levels    Low iron    Low serum vitamin D     Muscle spasms of both lower extremities    Spasms in lower back   Non-celiac gluten sensitivity    Reactive airway disease    Spondyloarthritis    genetic marker   UTI (lower urinary tract infection)  Past Surgical History:  Procedure Laterality Date   ABDOMINAL HYSTERECTOMY  08/21/15   BILATERAL SALPINGECTOMY Bilateral 08/21/2015   Procedure: BILATERAL SALPINGECTOMY;  Surgeon: Bobie FORBES Cathlyn JAYSON Nikki, MD;  Location: [facility];  Service: Gynecology;  Laterality: Bilateral;   COLONOSCOPY     CYSTOSCOPY N/A 08/21/2015   Procedure: CYSTOSCOPY;  Surgeon: Bobie FORBES Cathlyn JAYSON Nikki, MD;  Location: [facility];  Service: Gynecology;  Laterality: N/A;   MOUTH SURGERY     cyst in mouth   ROBOTIC ASSISTED TOTAL HYSTERECTOMY WITH SALPINGECTOMY Bilateral 08/21/2015   Procedure: ROBOTIC ASSISTED TOTAL HYSTERECTOMY ;   Surgeon: Bobie FORBES Cathlyn JAYSON Nikki, MD;  Location: [facility];  Service: Gynecology;  Laterality: Bilateral;   WISDOM TOOTH EXTRACTION      Family History  Problem Relation Age of Onset   Hypertension Mother    Diabetes Mother    Anxiety disorder Mother    Diabetes Father    Colon polyps Father    Prostate cancer Father    Cancer Paternal Grandmother        ? type    Social History   Socioeconomic History   Marital status: Married    Spouse name: Not on file   Number of children: Not on file   Years of education: Not on file   Highest education level: Not on file  Occupational History   Not on file  Tobacco Use   Smoking status: Never   Smokeless tobacco: Never  Vaping Use   Vaping status: Never Used  Substance and Sexual Activity   Alcohol use: Yes    Alcohol/week: 2.0 standard drinks of alcohol    Types: 2 Glasses of wine per week   Drug use: No   Sexual activity: Yes    Partners: Male    Comment: Hyst  Other Topics Concern   Not on file  Social History Narrative   Married 1999 [employer]). 3 kids. [name].    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      Hobbies: rest, reading, cooking, acting, piano, kids   Social Drivers of Corporate Investment Banker Strain: Not on file  Food Insecurity: Not on file  Transportation Needs: Not on file  Physical Activity: Not on file  Stress: Not on file  Social Connections: Unknown (02/05/2022)   Received [facility], [facility]   Social Network    Social Network: Not on file  Intimate Partner Violence: Unknown (12/28/2021)   Received from [facility], [facility]   HITS    Physically Hurt: Not on file    Insult or Talk Down To: Not on file    Threaten Physical Harm: Not on file    Scream or Curse: Not on file    Outpatient Medications Prior to Visit  Medication Sig  Dispense Refill   adalimumab  (HUMIRA ) 40 MG/0.4ML pen Inject into the skin.     Calcium Carbonate-Vit D-Min (CALTRATE 600+D PLUS MINERALS) 600-800 MG-UNIT TABS One tab twice daily     estradiol  (VIVELLE -DOT) 0.025 MG/24HR Place 1 patch onto the skin 2 (two) times a week. 8 patch 0   folic acid  (FOLVITE ) 1 MG tablet Take 1 tablet by mouth daily.     methotrexate (RHEUMATREX) 2.5 MG tablet Take 2.5 mg by mouth once a week.  valACYclovir  (VALTREX ) 500 MG tablet Take one tab (500 mg) PO bid x 3 days prn for genital outbreak.  Take 4 tabs (2000 mg) orally every 12 hours for 24 hours for oral outbreak. 30 tablet 3   No facility-administered medications prior to visit.    Allergies  Allergen Reactions   Gluten Meal Other (See Comments)    Auto immune   Tramadol      nausea   Codeine  Nausea And Vomiting   Peanut-Containing Drug Products Other (See Comments)    By allergy  test...has eaten peanuts without issues     Review of Systems  HENT:  Negative for congestion and hearing loss.   Eyes:  Positive for blurred vision (needs reading glasses).  Respiratory:  Negative for cough.   Cardiovascular:  Negative for leg swelling.  Gastrointestinal:  Negative for constipation and diarrhea.  Genitourinary:  Negative for dysuria.  Musculoskeletal:  Negative for joint pain and myalgias.  Neurological:  Negative for headaches.  Psychiatric/Behavioral:         Denies depression/anxiety   Results LABS Blood count: normal (08/2023) Kidney function: normal (08/2023) Liver function: normal (08/2023) Inflammatory marker: normal (08/2023) Albumin: low (05/2023) Cholesterol: normal (11/2022)    Objective:    Physical Exam   BP 128/75   Pulse (!) 56   Temp 98.5 F (36.9 C) (Oral)   Resp 16   Ht 5' 2.5 (1.588 m)   Wt 112 lb (50.8 kg)   LMP 08/07/2015   SpO2 100%   BMI 20.16 kg/m  Wt Readings from Last 3 Encounters:  10/03/23 112 lb (50.8 kg)  06/18/23 114 lb 2 oz (51.8 kg)  04/14/23  113 lb (51.3 kg)   Lab Results  Component Value Date   CHOL 195 12/12/2022   HDL 57.20 12/12/2022   LDLCALC 124 (H) 12/12/2022   TRIG 68.0 12/12/2022   CHOLHDL 3 12/12/2022   Physical Exam  Constitutional: She is oriented to person, place, and time. She appears well-developed and well-nourished. No distress.  HENT:  Head: Normocephalic and atraumatic.  Right Ear: Tympanic membrane and ear canal normal.  Left Ear: Tympanic membrane and ear canal normal.  Mouth/Throat: Oropharynx is clear and moist.  Eyes: Pupils are equal, round, and reactive to light. No scleral icterus.  Neck: Normal range of motion. No thyromegaly present.  Cardiovascular: Normal rate and regular rhythm.   No murmur heard. Pulmonary/Chest: Effort normal and breath sounds normal. No respiratory distress. He has no wheezes. She has no rales. She exhibits no tenderness.  Abdominal: Soft. Bowel sounds are normal. She exhibits no distension and no mass. There is no tenderness. There is no rebound and no guarding.  Musculoskeletal: She exhibits no edema.  Lymphadenopathy:    She has no cervical adenopathy.  Neurological: She is alert and oriented to person, place, and time. She has normal patellar reflexes. She exhibits normal muscle tone. Coordination normal.  Skin: Skin is warm and dry.  Psychiatric: She has a normal mood and affect. Her behavior is normal. Judgment and thought content normal.  Breast/pelvic: deferred          Assessment & Plan:       Assessment & Plan:   Problem List Items Addressed This Visit       Unprioritized   Preventative health care - Primary    Last colonoscopy was in 2017. A new one is scheduled for March 2025. -Continue with scheduled colonoscopy.  Influenza Vaccination Received for the current year. -Continue annual influenza  vaccinations.  COVID-19 Vaccination Has not received the latest COVID-19 booster. -Consider receiving the COVID-19 booster at a local  pharmacy.  Osteopenia Patient has been taking calcium supplements and maintaining regular exercise. -Continue calcium supplementation at 1200mg  daily and regular exercise.  Eye Health Patient has a scheduled vision test and has been experiencing issues with a tear duct. -Attend scheduled vision test and discuss tear duct issue with optometrist.  General Health Maintenance Patient has been maintaining a healthy diet and regular exercise. Recent labs show normal results. -Continue healthy diet and regular exercise regimen. -Consider COVID-19 booster vaccination. -Attend scheduled vision test and discuss tear duct issue with optometrist.       I am having Janice BIRCH. Iden maintain her folic acid , Caltrate 600+D Plus Minerals, valACYclovir , methotrexate, estradiol , and adalimumab .  No orders of the defined types were placed in this encounter.

## 2023-10-03 NOTE — Assessment & Plan Note (Signed)
  Last colonoscopy was in 2017. A new one is scheduled for March 2025. -Continue with scheduled colonoscopy.  Influenza Vaccination Received for the current year. -Continue annual influenza vaccinations.  COVID-19 Vaccination Has not received the latest COVID-19 booster. -Consider receiving the COVID-19 booster at a local pharmacy.  Osteopenia Patient has been taking calcium supplements and maintaining regular exercise. -Continue calcium supplementation at 1200mg  daily and regular exercise.  Eye Health Patient has a scheduled vision test and has been experiencing issues with a tear duct. -Attend scheduled vision test and discuss tear duct issue with optometrist.  General Health Maintenance Patient has been maintaining a healthy diet and regular exercise. Recent labs show normal results. -Continue healthy diet and regular exercise regimen. -Consider COVID-19 booster vaccination. -Attend scheduled vision test and discuss tear duct issue with optometrist.

## 2023-10-07 ENCOUNTER — Telehealth: Payer: Self-pay

## 2023-10-07 DIAGNOSIS — N951 Menopausal and female climacteric states: Secondary | ICD-10-CM

## 2023-10-07 NOTE — Telephone Encounter (Signed)
 Encounter reviewed and closed.

## 2023-10-07 NOTE — Telephone Encounter (Signed)
 Med refill request: estradiol  Last AEX: Next AEX: Last MMG (if hormonal med) Refill authorized: Last Rx sent #[phone] with zero refills on 09/29/2023. Rx request denied as this was already filled at [facility]. Confirmed with patient as this was already picked up.

## 2023-10-15 NOTE — Progress Notes (Signed)
 53 y.o. [id number] Married African American female here for annual exam.    Doing well with her estradiol  patch and wants to continue.  Hot flashes are eliminated.  No night sweats.   Occasional dryness vaginally.  Not using vaginal estrogen currently.   Not having UTI.  Wants refill of Valtrex .   Works for [employer].   PCP: Daryl Setter, NP   Patient's last menstrual period was 08/07/2015.           Sexually active: Yes.    The current method of family planning is status post hysterectomy.    Menopausal hormone therapy:  vivelle -dot Exercising: Yes.    Home/gym Smoker:  no  OB History  Gravida Para Term Preterm AB Living  5 3 3  2 3   SAB IAB Ectopic Multiple Live Births  1 1       # Outcome Date GA Lbr Len/2nd Weight Sex Type Anes PTL Lv  5 IAB           4 SAB           3 Term           2 Term           1 Term              HEALTH MAINTENANCE: Last 2 paps:  03-31-15 Neg:Neg HR HPV;  2014 neg  History of abnormal Pap or positive HPV:  no Mammogram:   08/01/23 Breast Density Cat C, BI-RADS CAT 1 neg Colonoscopy:  06/08/15 active chronic colitis --[facility], next scheduled 11/2023 Bone Density:  08/08/23 Result  osteopenia   Immunization History  Administered Date(s) Administered   Influenza, High Dose Seasonal PF 08/24/2018   Influenza,inj,Quad PF,6+ Mos 06/20/2017, 06/07/2019, 09/08/2020, 06/28/2021, 09/24/2022   Influenza-Unspecified 09/08/2020   PFIZER(Purple Top)SARS-COV-2 Vaccination 12/06/2019, 01/03/2020, 05/23/2020   Pneumococcal Conjugate-13 10/06/2017   Pneumococcal Polysaccharide-23 04/08/2018   Tdap 09/24/1999, 09/23/2009, 11/08/2019   Zoster Recombinant(Shingrix) 09/11/2021, 11/09/2021      reports that she has never smoked. She has never been exposed to tobacco smoke. She has never used smokeless tobacco. She reports current alcohol use of about 2.0 standard drinks of alcohol per week. She reports that she does not use drugs.  Past  Medical History:  Diagnosis Date   Abnormal CT of the abdomen    Abnormal uterine bleeding    Anemia    iron def   Ankylosing spondylitis (HCC)    Chicken pox    Colitis    Crohn disease (HCC)    Dysrhythmia    occassional fluttering noted by patient   Genital warts    Gluten intolerance    History of chlamydia    History of hysterectomy 08/21/2015   HLA B27 (HLA B27 positive)    HSV-1 (herpes simplex virus 1) infection    Hyperlipidemia    Low calcium levels    Low iron    Low serum vitamin D     Muscle spasms of both lower extremities    Spasms in lower back   Non-celiac gluten sensitivity    Reactive airway disease    Spondyloarthritis    genetic marker   UTI (lower urinary tract infection)     Past Surgical History:  Procedure Laterality Date   ABDOMINAL HYSTERECTOMY  08/21/15   BILATERAL SALPINGECTOMY Bilateral 08/21/2015   Procedure: BILATERAL SALPINGECTOMY;  Surgeon: Bobie FORBES Cathlyn JAYSON Nikki, MD;  Location: [facility];  Service: Gynecology;  Laterality: Bilateral;  COLONOSCOPY     CYSTOSCOPY N/A 08/21/2015   Procedure: CYSTOSCOPY;  Surgeon: Bobie FORBES Cathlyn JAYSON Nikki, MD;  Location: [facility];  Service: Gynecology;  Laterality: N/A;   MOUTH SURGERY     cyst in mouth   ROBOTIC ASSISTED TOTAL HYSTERECTOMY WITH SALPINGECTOMY Bilateral 08/21/2015   Procedure: ROBOTIC ASSISTED TOTAL HYSTERECTOMY ;  Surgeon: Bobie FORBES Cathlyn JAYSON Nikki, MD;  Location: [facility];  Service: Gynecology;  Laterality: Bilateral;   WISDOM TOOTH EXTRACTION      Current Outpatient Medications  Medication Sig Dispense Refill   adalimumab  (HUMIRA ) 40 MG/0.4ML pen Inject into the skin.     Calcium Carbonate-Vit D-Min (CALTRATE 600+D PLUS MINERALS) 600-800 MG-UNIT TABS One tab twice daily     estradiol  (VIVELLE -DOT) 0.025 MG/24HR Place 1 patch onto the skin 2 (two) times a week. 8 patch 0   folic acid  (FOLVITE ) 1 MG tablet Take 1 tablet by mouth daily.     methotrexate (RHEUMATREX) 2.5 MG tablet Take 2.5 mg  by mouth once a week.     valACYclovir  (VALTREX ) 500 MG tablet Take one tab (500 mg) PO bid x 3 days prn for genital outbreak.  Take 4 tabs (2000 mg) orally every 12 hours for 24 hours for oral outbreak. 30 tablet 3   No current facility-administered medications for this visit.    ALLERGIES: Gluten meal, Tramadol , Codeine , and Peanut-containing drug products  Family History  Problem Relation Age of Onset   Hypertension Mother    Diabetes Mother    Anxiety disorder Mother    Diabetes Father    Colon polyps Father    Prostate cancer Father    Cancer Paternal Grandmother        ? type    Review of Systems  All other systems reviewed and are negative.   PHYSICAL EXAM:  BP 100/68 (BP Location: Left Arm, Patient Position: Sitting, Cuff Size: Normal)   Pulse 87   Ht 5' 2.5 (1.588 m)   Wt 111 lb (50.3 kg)   LMP 08/07/2015   SpO2 96%   BMI 19.98 kg/m     General appearance: alert, cooperative and appears stated age Head: normocephalic, without obvious abnormality, atraumatic Neck: no adenopathy, supple, symmetrical, trachea midline and thyroid  normal to inspection and palpation Lungs: clear to auscultation bilaterally Breasts: normal appearance, no masses or tenderness, No nipple retraction or dimpling, No nipple discharge or bleeding, No axillary adenopathy Heart: regular rate and rhythm Abdomen: soft, non-tender; no masses, no organomegaly Extremities: extremities normal, atraumatic, no cyanosis or edema Skin: skin color, texture, turgor normal. No rashes or lesions Lymph nodes: cervical, supraclavicular, and axillary nodes normal. Neurologic: grossly normal  Pelvic: External genitalia:  no lesions              No abnormal inguinal nodes palpated.              Urethra:  normal appearing urethra with no masses, tenderness or lesions              Bartholins and Skenes: normal                 Vagina: normal appearing vagina with normal color and discharge, no lesions               Cervix: absent              Pap taken: No. Bimanual Exam:  Uterus:  absent  Adnexa: no mass, fullness, tenderness              Rectal exam: Yes.  .  Confirms.              Anus:  normal sphincter tone, no lesions  Chaperone was present for exam:  Darice BROCKS, CMA  ASSESSMENT: Well woman visit with gynecologic exam Status post robotic TLH, bilateral salpingectomy.  HSV.  Oral and genital. Crohn's. On Humira .  Post coital UTIs.  Resolved.  Osteopenia. PHQ- 2: 0  PLAN: Mammogram screening discussed. Self breast awareness reviewed. Pap and HRV collected:  No.  Not indicated.  Guidelines for Calcium, Vitamin D , regular exercise program including cardiovascular and weight bearing exercise. We discussed potential risks and benefits or ERT.  Risks may include stroke, PE, CVT.  Benefits include control of vasomotor symptoms, reduce risk of osteoporosis, and improving well being.  Medication refills:  Vivelle  Dot 0.025 mg twice weekly.  #24, RF 3.  Vaginal estradiol  cream for vaginal and urethra.   Valtrex .  Will consider taking daily for prevention and then increased dosage for oral or genital outbreak.  Follow up:  yearly and prn.

## 2023-10-21 ENCOUNTER — Ambulatory Visit (INDEPENDENT_AMBULATORY_CARE_PROVIDER_SITE_OTHER): Payer: Federal, State, Local not specified - PPO | Admitting: Psychology

## 2023-10-21 DIAGNOSIS — F4322 Adjustment disorder with anxiety: Secondary | ICD-10-CM | POA: Diagnosis not present

## 2023-10-21 NOTE — Progress Notes (Signed)
[facility] Counselor/Therapist Progress Note  Patient ID: Janice Zuniga, MRN: [id number],    Date: 10/21/2023  Time Spent: 4:00pm-4:55pm   55 minutes   Treatment Type: Individual Therapy  Reported Symptoms: stress  Mental Status Exam: Appearance:  Casual     Behavior: Appropriate  Motor: Normal  Speech/Language:  Normal Rate  Affect: Appropriate  Mood: normal  Thought process: normal  Thought content:   WNL  Sensory/Perceptual disturbances:   WNL  Orientation: oriented to person, place, time/date, and situation  Attention: Good  Concentration: Good  Memory: WNL  Fund of knowledge:  Good  Insight:   Good  Judgment:  Good  Impulse Control: Good   Risk Assessment: Danger to Self:  No Self-injurious Behavior: No Danger to Others: No Duty to Warn:no Physical Aggression / Violence:No  Access to Firearms a concern: No    Gang Involvement:No   Subjective: Pt present for face-to-face individual therapy via video.  Pt consents to telehealth video session and is aware of limitations and benefits of virtual sessions. Location of pt: home Location of therapist: home office.   Pt talked about being busy with work.  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    There's a possibility pt would have to go back to the office instead of working remotely.  This would be a hard transition.  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  Addressed how this impacts pt.  Pt is feeling anxious bc of the unknown changes.   Pt talked about her [employer] business.  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 Pt states at times she feels down that the business is not growing fast enough.  Pt talked about visiting her mother in law in [place] who is not doing well.   Pt and husband talked about how fragile life is and they want to maximize  working toward their goals.   Provided supportive therapy.     Interventions: Cognitive Behavioral Therapy and Insight-Oriented  Diagnosis:  F43.22  Plan of Care: Recommend ongoing therapy.  Pt participated in setting treatment goals.  Pt wants to improve coping skills and relationships.  Pt wants to have a safe place to talk.  Plan to meet every two weeks.   Treatment Plan Client Abilities/Strengths  Pt is bright, engaging and motivated for therapy.  Client Treatment Preferences  Individual therapy.  Client Statement of Needs  Improve coping skills and relationships. Symptoms  Autonomic hyperactivity (e.g., palpitations, shortness of breath, dry mouth, trouble swallowing, nausea, diarrhea). Excessive and/or unrealistic worry that is difficult to control occurring more days than not for at least 6 months about a number of events or activities. Hypervigilance (e.g., feeling constantly on edge, experiencing concentration difficulties, having trouble falling or staying asleep, exhibiting a general state of irritability). Motor tension (e.g., restlessness, tiredness, shakiness, muscle tension). Problems Addressed  Anxiety Goals 1. Enhance ability to effectively cope with the full variety of life's worries and anxieties. 2. Learn and implement coping skills that result in a reduction of anxiety and worry, and improved daily functioning. Objective Learn to accept limitations in life and commit to tolerating, rather than avoiding, unpleasant emotions while accomplishing meaningful goals. Target Date: 2024-03-13 Frequency: Biweekly Progress: 10 Modality: individual Related Interventions 1. Use techniques from Acceptance and Commitment Therapy to help client accept uncomfortable realities such as lack of complete  control, imperfections, and uncertainty and tolerate unpleasant emotions and thoughts in order to accomplish value-consistent goals. Objective Learn and implement problem-solving  strategies for realistically addressing worries. Target Date: 2024-03-13 Frequency: Biweekly Progress: 10 Modality: individual Related Interventions 1. Assign the client a homework exercise in which he/she problem-solves a current problem.  review, reinforce success, and provide corrective feedback toward improvement. 2. Teach the client problem-solving strategies involving specifically defining a problem, generating options for addressing it, evaluating the pros and cons of each option, selecting and implementing an optional action, and reevaluating and refining the action. Objective Learn and implement calming skills to reduce overall anxiety and manage anxiety symptoms. Target Date: 2024-03-13 Frequency: Biweekly Progress: 10 Modality: individual Related Interventions 1. Assign the client to read about progressive muscle relaxation and other calming strategies in relevant books or treatment manuals (e.g., Progressive Relaxation Training by Robb Matar and Alen Blew; Mastery of Your Anxiety and Worry: Workbook by Earlie Counts). 2. Assign the client homework each session in which he/she practices relaxation exercises daily, gradually applying them progressively from non-anxiety-provoking to anxiety-provoking situations; review and reinforce success while providing corrective feedback toward improvement. 3. Teach the client calming/relaxation skills (e.g., applied relaxation, progressive muscle relaxation, cue controlled relaxation; mindful breathing; biofeedback) and how to discriminate better between relaxation and tension; teach the client how to apply these skills to his/her daily life. 3. Reduce overall frequency, intensity, and duration of the anxiety so that daily functioning is not impaired. 4. Resolve the core conflict that is the source of anxiety. 5. Stabilize anxiety level while increasing ability to function on a daily basis. Diagnosis :    F43.22 Conditions For  Discharge Achievement of treatment goals and objectives.  Taziyah Iannuzzi, LCSW

## 2023-10-29 ENCOUNTER — Encounter: Payer: Self-pay | Admitting: Obstetrics and Gynecology

## 2023-10-29 ENCOUNTER — Ambulatory Visit (INDEPENDENT_AMBULATORY_CARE_PROVIDER_SITE_OTHER): Payer: Federal, State, Local not specified - PPO | Admitting: Obstetrics and Gynecology

## 2023-10-29 VITALS — BP 100/68 | HR 87 | Ht 62.5 in | Wt 111.0 lb

## 2023-10-29 DIAGNOSIS — B009 Herpesviral infection, unspecified: Secondary | ICD-10-CM | POA: Diagnosis not present

## 2023-10-29 DIAGNOSIS — Z79899 Other long term (current) drug therapy: Secondary | ICD-10-CM | POA: Diagnosis not present

## 2023-10-29 DIAGNOSIS — Z1331 Encounter for screening for depression: Secondary | ICD-10-CM

## 2023-10-29 DIAGNOSIS — N951 Menopausal and female climacteric states: Secondary | ICD-10-CM | POA: Diagnosis not present

## 2023-10-29 DIAGNOSIS — Z01419 Encounter for gynecological examination (general) (routine) without abnormal findings: Secondary | ICD-10-CM | POA: Diagnosis not present

## 2023-10-29 MED ORDER — ESTRADIOL 0.1 MG/GM VA CREA: TOPICAL_CREAM | VAGINAL | 1 refills | Status: AC

## 2023-10-29 MED ORDER — VALACYCLOVIR HCL 500 MG PO TABS: ORAL_TABLET | ORAL | 3 refills | Status: AC

## 2023-10-29 MED ORDER — ESTRADIOL 0.025 MG/24HR TD PTTW: 1.0000 | MEDICATED_PATCH | TRANSDERMAL | 3 refills | Status: DC

## 2023-10-29 NOTE — Patient Instructions (Signed)

## 2023-11-06 ENCOUNTER — Telehealth: Payer: Self-pay

## 2023-11-06 ENCOUNTER — Ambulatory Visit: Payer: Federal, State, Local not specified - PPO | Admitting: Nurse Practitioner

## 2023-11-06 VITALS — BP 100/66 | HR 72 | Temp 98.1°F | Wt 111.8 lb

## 2023-11-06 DIAGNOSIS — R3915 Urgency of urination: Secondary | ICD-10-CM

## 2023-11-06 DIAGNOSIS — N3000 Acute cystitis without hematuria: Secondary | ICD-10-CM

## 2023-11-06 DIAGNOSIS — N9489 Other specified conditions associated with female genital organs and menstrual cycle: Secondary | ICD-10-CM | POA: Diagnosis not present

## 2023-11-06 LAB — WET PREP FOR TRICH, YEAST, CLUE

## 2023-11-06 MED ORDER — NITROFURANTOIN MONOHYD MACRO 100 MG PO CAPS: 100.0000 mg | ORAL_CAPSULE | Freq: Two times a day (BID) | ORAL | 0 refills | Status: DC

## 2023-11-06 NOTE — Addendum Note (Signed)
Addended byWyline Beady on: 11/06/2023 12:50 PM   Modules accepted: Orders

## 2023-11-06 NOTE — Telephone Encounter (Signed)
BS pt LVM in triage line reporting some vaginal discomfort and is unsure if yeast or another form of vaginitis. Stated that appt desk advised her that there was no room in the inn today and she was sent to triage.   Spoke w/ the pt and she confirms that it is mainly a feeling as if she is mildly bloated, vaginal discomfort, and sometimes has urinary urgency but may not have to void.   Pt scheduled for OV w/ TW today @ 1115, arrival at 11.   Routing to provider for final review and encounter closed.

## 2023-11-06 NOTE — Progress Notes (Addendum)
   Acute Office Visit  Subjective:    Patient ID: Janice Zuniga, female    DOB: [birth year], 53 y.o.   MRN: [id number]   HPI 53 y.o. presents today for vaginal discomfort, abdominal bloating, urinary urgency x 2 days. No frequency, dysuria, discharge or odor.   Patient's last menstrual period was 08/07/2015.    Review of Systems  Constitutional: Negative.   Genitourinary:  Positive for urgency and vaginal pain. Negative for difficulty urinating, dysuria, frequency, hematuria, pelvic pain, vaginal bleeding and vaginal discharge.       Objective:    Physical Exam Constitutional:      Appearance: Normal appearance.  Abdominal:     Tenderness: There is no right CVA tenderness or left CVA tenderness.  Genitourinary:    General: Normal vulva.     Vagina: Normal.     BP 100/66 (BP Location: Left Arm, Patient Position: Sitting, Cuff Size: Normal)   Pulse 72   Temp 98.1 F (36.7 C) (Oral)   Wt 111 lb 12.8 oz (50.7 kg)   LMP 08/07/2015   SpO2 96%   BMI 20.12 kg/m  Wt Readings from Last 3 Encounters:  11/06/23 111 lb 12.8 oz (50.7 kg)  10/29/23 111 lb (50.3 kg)  10/03/23 112 lb (50.8 kg)        Patient informed chaperone available to be present for breast and/or pelvic exam. Patient has requested no chaperone to be present. Patient has been advised what will be completed during breast and pelvic exam.   Wet prep negative for pathogens  UA: 1+ leukocytes, neg nitrites, neg blood, neg protein, yellow/clear. Microscopic: wbc 10-20, rbc 0-2, moderate bacteria  Assessment & Plan:   Problem List Items Addressed This Visit   None Visit Diagnoses       Acute cystitis without hematuria    -  Primary   Relevant Medications   nitrofurantoin, macrocrystal-monohydrate, (MACROBID) 100 MG capsule     Urinary urgency       Relevant Orders   Urinalysis,Complete w/RFL Culture (Completed)     Vaginal burning       Relevant Orders   WET PREP FOR TRICH, YEAST, CLUE  (Completed)      Plan: Acute cystitis - Macrobid 100 mg BID x 7 days. Culture pending. Wet prep negative.   Return if symptoms worsen or fail to improve.    Olivia Mackie DNP, 2:59 PM 11/06/2023

## 2023-11-09 LAB — URINE CULTURE
MICRO NUMBER:: 16080591
SPECIMEN QUALITY:: ADEQUATE

## 2023-11-09 LAB — URINALYSIS, COMPLETE W/RFL CULTURE
Bilirubin Urine: NEGATIVE
Glucose, UA: NEGATIVE
Hyaline Cast: NONE SEEN /[LPF]
Ketones, ur: NEGATIVE
Nitrites, Initial: NEGATIVE
Protein, ur: NEGATIVE
Specific Gravity, Urine: 1.01 (ref 1.001–1.035)
pH: 6 (ref 5.0–8.0)

## 2023-11-09 LAB — CULTURE INDICATED

## 2023-11-10 ENCOUNTER — Other Ambulatory Visit: Payer: Self-pay | Admitting: Nurse Practitioner

## 2023-11-10 ENCOUNTER — Encounter: Payer: Self-pay | Admitting: Nurse Practitioner

## 2023-11-10 DIAGNOSIS — N3000 Acute cystitis without hematuria: Secondary | ICD-10-CM

## 2023-11-10 MED ORDER — AMOXICILLIN-POT CLAVULANATE 500-125 MG PO TABS: 1.0000 | ORAL_TABLET | Freq: Two times a day (BID) | ORAL | 0 refills | Status: AC

## 2023-11-11 ENCOUNTER — Ambulatory Visit: Payer: Federal, State, Local not specified - PPO | Admitting: Psychology

## 2023-11-11 DIAGNOSIS — F4322 Adjustment disorder with anxiety: Secondary | ICD-10-CM | POA: Diagnosis not present

## 2023-11-11 NOTE — Progress Notes (Signed)
[facility] Counselor/Therapist Progress Note  Patient ID: Genelle Economou, MRN: [id number],    Date: 11/11/2023  Time Spent: 4:00pm-4:55pm   55 minutes   Treatment Type: Individual Therapy  Reported Symptoms: stress  Mental Status Exam: Appearance:  Casual     Behavior: Appropriate  Motor: Normal  Speech/Language:  Normal Rate  Affect: Appropriate  Mood: normal  Thought process: normal  Thought content:   WNL  Sensory/Perceptual disturbances:   WNL  Orientation: oriented to person, place, time/date, and situation  Attention: Good  Concentration: Good  Memory: WNL  Fund of knowledge:  Good  Insight:   Good  Judgment:  Good  Impulse Control: Good   Risk Assessment: Danger to Self:  No Self-injurious Behavior: No Danger to Others: No Duty to Warn:no Physical Aggression / Violence:No  Access to Firearms a concern: No    Gang Involvement:No   Subjective: Pt present for face-to-face individual therapy via video.  Pt consents to telehealth video session and is aware of limitations and benefits of virtual sessions. Location of pt: home Location of therapist: home office.   Pt talked about things being stressful with her job bc of the changes from the new administration.   Pt is worried about being let go.  Pt is also not be able to work remotely anymore and will have to go back to the office.   They have to go back to the office March 10th.   Addressed pt's stress about the changes with work.  Pt is worried about losing her job.  Pt feels devalued and morale is very low.   Pt states she feels like she is "in a funk".  She has been having some trouble sleeping.  She has been worrying about worst case scenarios and over thinking issues.   Worked on stress management and increasing self care.   Pt will try to exercise and incorporate more time to relax.   Provided supportive therapy.     Interventions: Cognitive Behavioral Therapy and  Insight-Oriented  Diagnosis:  F43.22  Plan of Care: Recommend ongoing therapy.  Pt participated in setting treatment goals.  Pt wants to improve coping skills and relationships.  Pt wants to have a safe place to talk.  Plan to meet every two weeks.   Treatment Plan Client Abilities/Strengths  Pt is bright, engaging and motivated for therapy.  Client Treatment Preferences  Individual therapy.  Client Statement of Needs  Improve coping skills and relationships. Symptoms  Autonomic hyperactivity (e.g., palpitations, shortness of breath, dry mouth, trouble swallowing, nausea, diarrhea). Excessive and/or unrealistic worry that is difficult to control occurring more days than not for at least 6 months about a number of events or activities. Hypervigilance (e.g., feeling constantly on edge, experiencing concentration difficulties, having trouble falling or staying asleep, exhibiting a general state of irritability). Motor tension (e.g., restlessness, tiredness, shakiness, muscle tension). Problems Addressed  Anxiety Goals 1. Enhance ability to effectively cope with the full variety of life's worries and anxieties. 2. Learn and implement coping skills that result in a reduction of anxiety and worry, and improved daily functioning. Objective Learn to accept limitations in life and commit to tolerating, rather than avoiding, unpleasant emotions while accomplishing meaningful goals. Target Date: 2024-03-13 Frequency: Biweekly Progress: 10 Modality: individual Related Interventions 1. Use techniques from Acceptance and Commitment Therapy to help client accept uncomfortable realities such as lack of complete control, imperfections, and uncertainty and tolerate unpleasant emotions and thoughts in order to accomplish value-consistent goals.  Objective Learn and implement problem-solving strategies for realistically addressing worries. Target Date: 2024-03-13 Frequency: Biweekly Progress: 10 Modality:  individual Related Interventions 1. Assign the client a homework exercise in which he/she problem-solves a current problem.  review, reinforce success, and provide corrective feedback toward improvement. 2. Teach the client problem-solving strategies involving specifically defining a problem, generating options for addressing it, evaluating the pros and cons of each option, selecting and implementing an optional action, and reevaluating and refining the action. Objective Learn and implement calming skills to reduce overall anxiety and manage anxiety symptoms. Target Date: 2024-03-13 Frequency: Biweekly Progress: 10 Modality: individual Related Interventions 1. Assign the client to read about progressive muscle relaxation and other calming strategies in relevant books or treatment manuals (e.g., Progressive Relaxation Training by Robb Matar and Alen Blew; Mastery of Your Anxiety and Worry: Workbook by Earlie Counts). 2. Assign the client homework each session in which he/she practices relaxation exercises daily, gradually applying them progressively from non-anxiety-provoking to anxiety-provoking situations; review and reinforce success while providing corrective feedback toward improvement. 3. Teach the client calming/relaxation skills (e.g., applied relaxation, progressive muscle relaxation, cue controlled relaxation; mindful breathing; biofeedback) and how to discriminate better between relaxation and tension; teach the client how to apply these skills to his/her daily life. 3. Reduce overall frequency, intensity, and duration of the anxiety so that daily functioning is not impaired. 4. Resolve the core conflict that is the source of anxiety. 5. Stabilize anxiety level while increasing ability to function on a daily basis. Diagnosis :    F43.22 Conditions For Discharge Achievement of treatment goals and objectives.  Davisha Linthicum, LCSW

## 2023-11-25 ENCOUNTER — Telehealth: Payer: Self-pay

## 2023-11-25 ENCOUNTER — Other Ambulatory Visit: Payer: Self-pay | Admitting: Nurse Practitioner

## 2023-11-25 DIAGNOSIS — N760 Acute vaginitis: Secondary | ICD-10-CM

## 2023-11-25 MED ORDER — FLUCONAZOLE 150 MG PO TABS: 150.0000 mg | ORAL_TABLET | ORAL | 0 refills | Status: DC

## 2023-11-25 NOTE — Telephone Encounter (Signed)
 Patient aware.

## 2023-11-25 NOTE — Telephone Encounter (Signed)
 Diflucan sent

## 2023-11-25 NOTE — Telephone Encounter (Signed)
 Patient is calling asking for diflucan. She states that she was treated for a UTI and that now that she has finished the antibiotics she is having symptoms of yeast. Please advise.

## 2023-11-26 ENCOUNTER — Ambulatory Visit: Admitting: Nurse Practitioner

## 2023-11-26 VITALS — BP 100/62 | HR 62 | Wt 110.0 lb

## 2023-11-26 DIAGNOSIS — H04221 Epiphora due to insufficient drainage, right lacrimal gland: Secondary | ICD-10-CM | POA: Diagnosis not present

## 2023-11-26 DIAGNOSIS — N898 Other specified noninflammatory disorders of vagina: Secondary | ICD-10-CM | POA: Diagnosis not present

## 2023-11-26 DIAGNOSIS — R1030 Lower abdominal pain, unspecified: Secondary | ICD-10-CM | POA: Diagnosis not present

## 2023-11-26 DIAGNOSIS — H524 Presbyopia: Secondary | ICD-10-CM | POA: Diagnosis not present

## 2023-11-26 DIAGNOSIS — H04561 Stenosis of right lacrimal punctum: Secondary | ICD-10-CM | POA: Diagnosis not present

## 2023-11-26 DIAGNOSIS — B9689 Other specified bacterial agents as the cause of diseases classified elsewhere: Secondary | ICD-10-CM

## 2023-11-26 DIAGNOSIS — H20041 Secondary noninfectious iridocyclitis, right eye: Secondary | ICD-10-CM | POA: Diagnosis not present

## 2023-11-26 DIAGNOSIS — N760 Acute vaginitis: Secondary | ICD-10-CM | POA: Diagnosis not present

## 2023-11-26 LAB — WET PREP FOR TRICH, YEAST, CLUE

## 2023-11-26 MED ORDER — METRONIDAZOLE 500 MG PO TABS: 500.0000 mg | ORAL_TABLET | Freq: Two times a day (BID) | ORAL | 0 refills | Status: DC

## 2023-11-26 NOTE — Progress Notes (Signed)
   Acute Office Visit  Subjective:    Patient ID: Janice Zuniga, female    DOB: Sep 26, 1970, 53 y.o.   MRN: [id number]   HPI 53 y.o. presents today for vaginal itching and lower abdominal pain. Treated for UTI 11/06/23 with Augmentin. Previous urinary symptoms resolved. Diflucan provided yesterday and she took last night. Had sexually intercourse just prior to symptoms starting.    Patient's last menstrual period was 08/07/2015.    Review of Systems  Constitutional: Negative.   Gastrointestinal:  Positive for abdominal pain.  Genitourinary:  Positive for vaginal pain. Negative for vaginal discharge.       Objective:    Physical Exam Constitutional:      Appearance: Normal appearance.  Genitourinary:    General: Normal vulva.     Vagina: Vaginal discharge present. No erythema.     BP 100/62   Pulse 62   Wt 110 lb (49.9 kg)   LMP 08/07/2015   SpO2 100%   BMI 19.80 kg/m  Wt Readings from Last 3 Encounters:  11/26/23 110 lb (49.9 kg)  11/06/23 111 lb 12.8 oz (50.7 kg)  10/29/23 111 lb (50.3 kg)        Patient informed chaperone available to be present for breast and/or pelvic exam. Patient has requested no chaperone to be present. Patient has been advised what will be completed during breast and pelvic exam.   UA 1+ leukocytes, neg nitrites, neg blood, yellow/slightly cloudy. Microscopic: wbc 6-10, rbc none, few bacteria  Wet prep + clue cells (+ odor)  Assessment & Plan:   Problem List Items Addressed This Visit   None Visit Diagnoses       Bacterial vaginosis    -  Primary   Relevant Medications   metroNIDAZOLE (FLAGYL) 500 MG tablet     Lower abdominal pain       Relevant Orders   Urinalysis,Complete w/RFL Culture     Vaginal itching       Relevant Orders   WET PREP FOR TRICH, YEAST, CLUE      Plan: Wet prep positive for clue cells - Flagyl 500 mg BID x 7 days. Mild leukocytosis in urine, could be from vaginal infection. Will wait on culture.    Return if symptoms worsen or fail to improve.    Olivia Mackie DNP, 11:52 AM 11/26/2023

## 2023-11-28 LAB — URINALYSIS, COMPLETE W/RFL CULTURE
Bilirubin Urine: NEGATIVE
Glucose, UA: NEGATIVE
Hgb urine dipstick: NEGATIVE
Hyaline Cast: NONE SEEN /LPF
Ketones, ur: NEGATIVE
Nitrites, Initial: NEGATIVE
Protein, ur: NEGATIVE
RBC / HPF: NONE SEEN /HPF (ref 0–2)
Specific Gravity, Urine: 1.015 (ref 1.001–1.035)
pH: 7 (ref 5.0–8.0)

## 2023-11-28 LAB — URINE CULTURE
MICRO NUMBER:: 16162230
SPECIMEN QUALITY:: ADEQUATE

## 2023-11-28 LAB — CULTURE INDICATED

## 2023-12-01 ENCOUNTER — Other Ambulatory Visit: Payer: Self-pay | Admitting: Nurse Practitioner

## 2023-12-01 ENCOUNTER — Encounter: Payer: Self-pay | Admitting: Nurse Practitioner

## 2023-12-01 DIAGNOSIS — N3000 Acute cystitis without hematuria: Secondary | ICD-10-CM

## 2023-12-01 MED ORDER — SULFAMETHOXAZOLE-TRIMETHOPRIM 800-160 MG PO TABS: 1.0000 | ORAL_TABLET | Freq: Two times a day (BID) | ORAL | 0 refills | Status: AC

## 2023-12-02 ENCOUNTER — Ambulatory Visit: Payer: Federal, State, Local not specified - PPO | Admitting: Psychology

## 2023-12-10 DIAGNOSIS — Z79899 Other long term (current) drug therapy: Secondary | ICD-10-CM | POA: Diagnosis not present

## 2023-12-10 DIAGNOSIS — E559 Vitamin D deficiency, unspecified: Secondary | ICD-10-CM | POA: Diagnosis not present

## 2023-12-10 DIAGNOSIS — M450 Ankylosing spondylitis of multiple sites in spine: Secondary | ICD-10-CM | POA: Diagnosis not present

## 2023-12-10 DIAGNOSIS — K5010 Crohn's disease of large intestine without complications: Secondary | ICD-10-CM | POA: Diagnosis not present

## 2023-12-10 DIAGNOSIS — M8589 Other specified disorders of bone density and structure, multiple sites: Secondary | ICD-10-CM | POA: Diagnosis not present

## 2023-12-17 DIAGNOSIS — K5080 Crohn's disease of both small and large intestine without complications: Secondary | ICD-10-CM | POA: Diagnosis not present

## 2023-12-17 DIAGNOSIS — I10 Essential (primary) hypertension: Secondary | ICD-10-CM | POA: Diagnosis not present

## 2023-12-17 DIAGNOSIS — K5090 Crohn's disease, unspecified, without complications: Secondary | ICD-10-CM | POA: Diagnosis not present

## 2023-12-17 DIAGNOSIS — Z79899 Other long term (current) drug therapy: Secondary | ICD-10-CM | POA: Diagnosis not present

## 2023-12-17 DIAGNOSIS — K6289 Other specified diseases of anus and rectum: Secondary | ICD-10-CM | POA: Diagnosis not present

## 2023-12-22 ENCOUNTER — Ambulatory Visit (INDEPENDENT_AMBULATORY_CARE_PROVIDER_SITE_OTHER): Payer: Federal, State, Local not specified - PPO | Admitting: Psychology

## 2023-12-22 DIAGNOSIS — F4322 Adjustment disorder with anxiety: Secondary | ICD-10-CM | POA: Diagnosis not present

## 2023-12-22 NOTE — Progress Notes (Signed)
 [facility] Counselor/Therapist Progress Note  Patient ID: Janice Zuniga, MRN: [id number],    Date: 12/22/2023  Time Spent: 4:00pm-4:55pm   55 minutes   Treatment Type: Individual Therapy  Reported Symptoms: stress  Mental Status Exam: Appearance:  Casual     Behavior: Appropriate  Motor: Normal  Speech/Language:  Normal Rate  Affect: Appropriate  Mood: normal  Thought process: normal  Thought content:   WNL  Sensory/Perceptual disturbances:   WNL  Orientation: oriented to person, place, time/date, and situation  Attention: Good  Concentration: Good  Memory: WNL  Fund of knowledge:  Good  Insight:   Good  Judgment:  Good  Impulse Control: Good   Risk Assessment: Danger to Self:  No Self-injurious Behavior: No Danger to Others: No Duty to Warn:no Physical Aggression / Violence:No  Access to Firearms a concern: No    Gang Involvement:No   Subjective: Pt present for face-to-face individual therapy via video.  Pt consents to telehealth video session and is aware of limitations and benefits of virtual sessions. Location of pt: home Location of therapist: home office.   Pt talked about things being stressful with her job bc of the changes from the new administration.  Pt is still employed but pt is not sure if she would be offered a buy out for early retirement.   Pt states work has been very stressful bc of the uncertainty.   Pt has been working 12-15 hours a day.   She states her time off is spent recuperating from work.  Worked on [employer].   Pt has been using a supplement to manage her anxiety.   It has been helping her feel more calm.   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   Worked on self care strategies.  Provided supportive therapy.     Interventions: Cognitive Behavioral Therapy and Insight-Oriented  Diagnosis:  F43.22  Plan of Care: Recommend ongoing  therapy.  Pt participated in setting treatment goals.  Pt wants to improve coping skills and relationships.  Pt wants to have a safe place to talk.  Plan to meet every two weeks.   Treatment Plan Client Abilities/Strengths  Pt is bright, engaging and motivated for therapy.  Client Treatment Preferences  Individual therapy.  Client Statement of Needs  Improve coping skills and relationships. Symptoms  Autonomic hyperactivity (e.g., palpitations, shortness of breath, dry mouth, trouble swallowing, nausea, diarrhea). Excessive and/or unrealistic worry that is difficult to control occurring more days than not for at least 6 months about a number of events or activities. Hypervigilance (e.g., feeling constantly on edge, experiencing concentration difficulties, having trouble falling or staying asleep, exhibiting a general state of irritability). Motor tension (e.g., restlessness, tiredness, shakiness, muscle tension). Problems Addressed  Anxiety Goals 1. Enhance ability to effectively cope with the full variety of life's worries and anxieties. 2. Learn and implement coping skills that result in a reduction of anxiety and worry, and improved daily functioning. Objective Learn to accept limitations in life and commit to tolerating, rather than avoiding, unpleasant emotions while accomplishing meaningful goals. Target Date: 2024-03-13 Frequency: Biweekly Progress: 10 Modality: individual Related Interventions 1. Use techniques from Acceptance and Commitment Therapy to help client accept uncomfortable realities such as lack of complete control, imperfections, and uncertainty and tolerate unpleasant emotions and thoughts in order to accomplish value-consistent goals. Objective Learn and implement problem-solving strategies for realistically addressing worries. Target  Date: 2024-03-13 Frequency: Biweekly Progress: 10 Modality: individual Related Interventions 1. Assign the client a homework exercise  in which he/she problem-solves a current problem.  review, reinforce success, and provide corrective feedback toward improvement. 2. Teach the client problem-solving strategies involving specifically defining a problem, generating options for addressing it, evaluating the pros and cons of each option, selecting and implementing an optional action, and reevaluating and refining the action. Objective Learn and implement calming skills to reduce overall anxiety and manage anxiety symptoms. Target Date: 2024-03-13 Frequency: Biweekly Progress: 10 Modality: individual Related Interventions 1. Assign the client to read about progressive muscle relaxation and other calming strategies in relevant books or treatment manuals (e.g., Progressive Relaxation Training by Robb Matar and Alen Blew; Mastery of Your Anxiety and Worry: Workbook by Earlie Counts). 2. Assign the client homework each session in which he/she practices relaxation exercises daily, gradually applying them progressively from non-anxiety-provoking to anxiety-provoking situations; review and reinforce success while providing corrective feedback toward improvement. 3. Teach the client calming/relaxation skills (e.g., applied relaxation, progressive muscle relaxation, cue controlled relaxation; mindful breathing; biofeedback) and how to discriminate better between relaxation and tension; teach the client how to apply these skills to his/her daily life. 3. Reduce overall frequency, intensity, and duration of the anxiety so that daily functioning is not impaired. 4. Resolve the core conflict that is the source of anxiety. 5. Stabilize anxiety level while increasing ability to function on a daily basis. Diagnosis :    F43.22 Conditions For Discharge Achievement of treatment goals and objectives.  Joseff Luckman, LCSW

## 2024-01-13 ENCOUNTER — Ambulatory Visit (INDEPENDENT_AMBULATORY_CARE_PROVIDER_SITE_OTHER): Payer: Federal, State, Local not specified - PPO | Admitting: Psychology

## 2024-01-13 DIAGNOSIS — F4322 Adjustment disorder with anxiety: Secondary | ICD-10-CM | POA: Diagnosis not present

## 2024-01-13 NOTE — Progress Notes (Signed)
 [facility] Counselor/Therapist Progress Note  Patient ID: Janice Zuniga, MRN: [id number],    Date: 01/13/2024  Time Spent: 4:00pm-4:55pm   55 minutes   Treatment Type: Individual Therapy  Reported Symptoms: stress  Mental Status Exam: Appearance:  Casual     Behavior: Appropriate  Motor: Normal  Speech/Language:  Normal Rate  Affect: Appropriate  Mood: normal  Thought process: normal  Thought content:   WNL  Sensory/Perceptual disturbances:   WNL  Orientation: oriented to person, place, time/date, and situation  Attention: Good  Concentration: Good  Memory: WNL  Fund of knowledge:  Good  Insight:   Good  Judgment:  Good  Impulse Control: Good   Risk Assessment: Danger to Self:  No Self-injurious Behavior: No Danger to Others: No Duty to Warn:no Physical Aggression / Violence:No  Access to Firearms a concern: No    Gang Involvement:No   Subjective: Pt present for face-to-face individual therapy via video.  Pt consents to telehealth video session and is aware of limitations and benefits of virtual sessions. Location of pt: home Location of therapist: home office.   Pt talked about traveling to [place]  to see her mother-in-law who passed away within a week.  There were some family issues that were challenging to deal with.   Addressed the family dynamics and how they impacted pt.  Pt and husband will need to go back to [place]  for the funeral.  This trip will create extra stress for pt as well.  Pt talked about work.  She was offered a promotion and pt started the new position today.  Pt thinks this new job will be more interesting for her.  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  Pt has felt unsure of herself when she introduced herself to her new team.  Pt has felt stressed about the expectations in the managers role.   Pt is feeling very tired and exhausted.   Addressed how pt can make time for rest and self care.  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   Worked on self care strategies.  Provided supportive therapy.     Interventions: Cognitive Behavioral Therapy and Insight-Oriented  Diagnosis:  F43.22  Plan of Care: Recommend ongoing therapy.  Pt participated in setting treatment goals.  Pt wants to improve coping skills and relationships.  Pt wants to have a safe place to talk.  Plan to meet every two weeks.   Treatment Plan Client Abilities/Strengths  Pt is bright, engaging and motivated for therapy.  Client Treatment Preferences  Individual therapy.  Client Statement of Needs  Improve coping skills and relationships. Symptoms  Autonomic hyperactivity (e.g., palpitations, shortness of breath, dry mouth, trouble swallowing, nausea, diarrhea). Excessive and/or unrealistic worry that is difficult to control occurring more days than not for at least 6 months about a number of events or activities. Hypervigilance (e.g., feeling constantly on edge, experiencing concentration difficulties, having trouble falling or staying asleep, exhibiting a general state of irritability). Motor tension (e.g., restlessness, tiredness, shakiness, muscle tension). Problems Addressed  Anxiety Goals 1. Enhance ability to effectively cope with the full variety of life's worries and anxieties. 2. Learn and implement coping skills that result in a reduction of anxiety and worry, and improved daily functioning. Objective Learn to accept limitations in life and commit to tolerating, rather than avoiding, unpleasant emotions while accomplishing meaningful goals. Target Date: 2024-03-13 Frequency: Biweekly Progress: 10  Modality: individual Related Interventions 1. Use techniques from Acceptance and Commitment Therapy to help client accept uncomfortable realities such as lack of complete control, imperfections, and uncertainty and tolerate unpleasant emotions and thoughts in order to  accomplish value-consistent goals. Objective Learn and implement problem-solving strategies for realistically addressing worries. Target Date: 2024-03-13 Frequency: Biweekly Progress: 10 Modality: individual Related Interventions 1. Assign the client a homework exercise in which he/she problem-solves a current problem.  review, reinforce success, and provide corrective feedback toward improvement. 2. Teach the client problem-solving strategies involving specifically defining a problem, generating options for addressing it, evaluating the pros and cons of each option, selecting and implementing an optional action, and reevaluating and refining the action. Objective Learn and implement calming skills to reduce overall anxiety and manage anxiety symptoms. Target Date: 2024-03-13 Frequency: Biweekly Progress: 10 Modality: individual Related Interventions 1. Assign the client to read about progressive muscle relaxation and other calming strategies in relevant books or treatment manuals (e.g., Progressive Relaxation Training by Rodolfo Clan and Arvil Birks; Mastery of Your Anxiety and Worry: Workbook by Rodney Clamp). 2. Assign the client homework each session in which he/she practices relaxation exercises daily, gradually applying them progressively from non-anxiety-provoking to anxiety-provoking situations; review and reinforce success while providing corrective feedback toward improvement. 3. Teach the client calming/relaxation skills (e.g., applied relaxation, progressive muscle relaxation, cue controlled relaxation; mindful breathing; biofeedback) and how to discriminate better between relaxation and tension; teach the client how to apply these skills to his/her daily life. 3. Reduce overall frequency, intensity, and duration of the anxiety so that daily functioning is not impaired. 4. Resolve the core conflict that is the source of anxiety. 5. Stabilize anxiety level while increasing ability to  function on a daily basis. Diagnosis :    F43.22 Conditions For Discharge Achievement of treatment goals and objectives.  Jelisha Weed, LCSW

## 2024-02-03 ENCOUNTER — Ambulatory Visit (INDEPENDENT_AMBULATORY_CARE_PROVIDER_SITE_OTHER): Admitting: Psychology

## 2024-02-03 DIAGNOSIS — F4322 Adjustment disorder with anxiety: Secondary | ICD-10-CM

## 2024-02-03 NOTE — Progress Notes (Signed)
 [facility] Counselor/Therapist Progress Note  Patient ID: Parma Picazo, MRN: [id number],    Date: 02/03/2024  Time Spent: 4:00pm-4:55pm   55 minutes   Treatment Type: Individual Therapy  Reported Symptoms: stress  Mental Status Exam: Appearance:  Casual     Behavior: Appropriate  Motor: Normal  Speech/Language:  Normal Rate  Affect: Appropriate  Mood: normal  Thought process: normal  Thought content:   WNL  Sensory/Perceptual disturbances:   WNL  Orientation: oriented to person, place, time/date, and situation  Attention: Good  Concentration: Good  Memory: WNL  Fund of knowledge:  Good  Insight:   Good  Judgment:  Good  Impulse Control: Good   Risk Assessment: Danger to Self:  No Self-injurious Behavior: No Danger to Others: No Duty to Warn:no Physical Aggression / Violence:No  Access to Firearms a concern: No    Gang Involvement:No   Subjective: Pt present for face-to-face individual therapy via video.  Pt consents to telehealth video session and is aware of limitations and benefits of virtual sessions. Location of pt: home Location of therapist: home office.   Pt talked about work.  She states her new job is going ok.  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   Pt is enjoying getting to know her employees.  There is a colleague who is a Production designer, theatre/television/film who may be challenging to work with.   Pt is a little concerned about that.  There are also going to be more layoffs the end of the month.  Pt is trying to not worry about what may happen. Pt states she is working on taking one day at a time and trying not to worry about what people think about her.   Pt talked about Mother's Day.  She had a good day and felt celebrated by her kids and husband.   Pt states she and her family are still "recovering" from her mother in law passing away and the travel.   Worked on self care strategies.  Provided supportive therapy.      Interventions: Cognitive Behavioral Therapy and Insight-Oriented  Diagnosis:  F43.22  Plan of Care: Recommend ongoing therapy.  Pt participated in setting treatment goals.  Pt wants to improve coping skills and relationships.  Pt wants to have a safe place to talk.  Plan to meet every two weeks.   Treatment Plan Client Abilities/Strengths  Pt is bright, engaging and motivated for therapy.  Client Treatment Preferences  Individual therapy.  Client Statement of Needs  Improve coping skills and relationships. Symptoms  Autonomic hyperactivity (e.g., palpitations, shortness of breath, dry mouth, trouble swallowing, nausea, diarrhea). Excessive and/or unrealistic worry that is difficult to control occurring more days than not for at least 6 months about a number of events or activities. Hypervigilance (e.g., feeling constantly on edge, experiencing concentration difficulties, having trouble falling or staying asleep, exhibiting a general state of irritability). Motor tension (e.g., restlessness, tiredness, shakiness, muscle tension). Problems Addressed  Anxiety Goals 1. Enhance ability to effectively cope with the full variety of life's worries and anxieties. 2. Learn and implement coping skills that result in a reduction of anxiety and worry, and improved daily functioning. Objective Learn to accept limitations in life and commit to tolerating, rather than avoiding, unpleasant emotions while accomplishing meaningful goals. Target Date: 2024-03-13 Frequency: Biweekly Progress: 10 Modality: individual Related Interventions 1. Use techniques from Acceptance and Commitment Therapy to help client accept uncomfortable realities such as lack of complete  control, imperfections, and uncertainty and tolerate unpleasant emotions and thoughts in order to accomplish value-consistent goals. Objective Learn and implement problem-solving strategies for realistically addressing worries. Target Date:  2024-03-13 Frequency: Biweekly Progress: 10 Modality: individual Related Interventions 1. Assign the client a homework exercise in which he/she problem-solves a current problem.  review, reinforce success, and provide corrective feedback toward improvement. 2. Teach the client problem-solving strategies involving specifically defining a problem, generating options for addressing it, evaluating the pros and cons of each option, selecting and implementing an optional action, and reevaluating and refining the action. Objective Learn and implement calming skills to reduce overall anxiety and manage anxiety symptoms. Target Date: 2024-03-13 Frequency: Biweekly Progress: 10 Modality: individual Related Interventions 1. Assign the client to read about progressive muscle relaxation and other calming strategies in relevant books or treatment manuals (e.g., Progressive Relaxation Training by Rodolfo Clan and Arvil Birks; Mastery of Your Anxiety and Worry: Workbook by Rodney Clamp). 2. Assign the client homework each session in which he/she practices relaxation exercises daily, gradually applying them progressively from non-anxiety-provoking to anxiety-provoking situations; review and reinforce success while providing corrective feedback toward improvement. 3. Teach the client calming/relaxation skills (e.g., applied relaxation, progressive muscle relaxation, cue controlled relaxation; mindful breathing; biofeedback) and how to discriminate better between relaxation and tension; teach the client how to apply these skills to his/her daily life. 3. Reduce overall frequency, intensity, and duration of the anxiety so that daily functioning is not impaired. 4. Resolve the core conflict that is the source of anxiety. 5. Stabilize anxiety level while increasing ability to function on a daily basis. Diagnosis :    F43.22 Conditions For Discharge Achievement of treatment goals and objectives.  Mykiah Schmuck,  LCSW

## 2024-02-11 ENCOUNTER — Ambulatory Visit: Admitting: Family

## 2024-03-11 ENCOUNTER — Ambulatory Visit: Admitting: Psychology

## 2024-04-13 ENCOUNTER — Ambulatory Visit (INDEPENDENT_AMBULATORY_CARE_PROVIDER_SITE_OTHER): Admitting: Psychology

## 2024-04-13 DIAGNOSIS — F4322 Adjustment disorder with anxiety: Secondary | ICD-10-CM

## 2024-04-13 NOTE — Progress Notes (Signed)
 [facility] Counselor Initial Adult Exam  Name: Janice Zuniga Date: 04/13/2024 MRN: [id number] DOB: 21-Nov-1970 PCP: Daryl Setter, NP  Time spent: 4:00pm-4:55pm   55 minutes  Guardian/Payee:  n/a    Paperwork requested: No   Reason for Visit /Presenting Problem: Pt present for face-to-face initial assessment update via video.  Pt consents to telehealth video session and is aware of limitations of virtual sessions. Location of pt: home Location of therapist: home office.  Pt continues to need support to cope with life stressors.   She has stress at work and she struggles with feelings of missing out on going into business for herself at some point.  Pt states she does not feel fulfilled.   Pt feels trapped in her job bc of the high income she makes and the need to continue to contribute to this level financially for her family.   Pt also has relationship issues with her parents that she wants to continue to work on.   Reviewed pt's treatment plan for annual update.   Updated treatment plan and IA.   Pt participated in setting treatment goals.   Plan to meet monthly.     Mental Status Exam: Appearance:   Casual     Behavior:  Appropriate  Motor:  Normal  Speech/Language:   Normal Rate  Affect:  Appropriate  Mood:  normal  Thought process:  normal  Thought content:    WNL  Sensory/Perceptual disturbances:    WNL  Orientation:  oriented to person, place, time/date, and situation  Attention:  Good  Concentration:  Good  Memory:  WNL  Fund of knowledge:   Good  Insight:    Good  Judgment:   Good  Impulse Control:  Good    Reported Symptoms:  anxiety  Risk Assessment: Danger to Self:  No Self-injurious Behavior: No Danger to Others: No Duty to Warn:no Physical Aggression / Violence:No  Access to Firearms a concern: No  Gang Involvement:No  Patient / guardian was educated about steps to take if suicide or homicide risk level increases between  visits: n/a While future psychiatric events cannot be accurately predicted, the patient does not currently require acute inpatient psychiatric care and does not currently meet [place]  involuntary commitment criteria.  Substance Abuse History: Current substance abuse: No     Past Psychiatric History:   Previous psychological history is significant for anxiety and depression Outpatient Providers:pt has been in therapy in the past. History of Psych Hospitalization: No  Psychological Testing: n/a   Abuse History:  Victim of: No., n/a   Report needed: No. Victim of Neglect:No. Perpetrator of n/a  Witness / Exposure to Domestic Violence: No   Protective Services Involvement: No  Witness to [employer] Violence:  No   Family History:  Family History  Problem Relation Age of Onset   Hypertension Mother    Diabetes Mother    Anxiety disorder Mother    Diabetes Father    Colon polyps Father    Prostate cancer Father    Cancer Paternal Grandmother        ? type    Living situation: the patient lives with her family  Sexual Orientation: Straight  Relationship Status: married for 26 years. Name of spouse / other:Mark If a parent, number of children / ages:3 children ages 70, 9, and 38.   Support Systems: spouse friends  Surveyor, quantity Stress:  No   Income/Employment/Disability: Employment Pt is working for the Investment banker, operational.  Pt works  remotely from home.    Military Service: No   Educational History: Education: Risk manager: Protestant  Any cultural differences that may affect / interfere with treatment:  not applicable   Recreation/Hobbies: Pt likes to garden.  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   Pt likes to work out.    Stressors: Marital or family conflict   Occupational concerns    Strengths: Supportive Relationships, Hopefulness, Self Advocate, and Able to Communicate  Effectively  Barriers:  none   Legal History: Pending legal issue / charges: The patient has no significant history of legal issues. History of legal issue / charges: n/a  Medical History/Surgical History: reviewed Past Medical History:  Diagnosis Date   Abnormal CT of the abdomen    Abnormal uterine bleeding    Anemia    iron def   Ankylosing spondylitis (HCC)    Chicken pox    Colitis    Crohn disease (HCC)    Dysrhythmia    occassional fluttering noted by patient   Genital warts    Gluten intolerance    History of chlamydia    History of hysterectomy 08/21/2015   HLA B27 (HLA B27 positive)    HSV-1 (herpes simplex virus 1) infection    Hyperlipidemia    Low calcium levels    Low iron    Low serum vitamin D     Muscle spasms of both lower extremities    Spasms in lower back   Non-celiac gluten sensitivity    Reactive airway disease    Spondyloarthritis    genetic marker   UTI (lower urinary tract infection)     Past Surgical History:  Procedure Laterality Date   ABDOMINAL HYSTERECTOMY  08/21/15   BILATERAL SALPINGECTOMY Bilateral 08/21/2015   Procedure: BILATERAL SALPINGECTOMY;  Surgeon: Bobie FORBES Cathlyn JAYSON Nikki, MD;  Location: [facility];  Service: Gynecology;  Laterality: Bilateral;   COLONOSCOPY     CYSTOSCOPY N/A 08/21/2015   Procedure: CYSTOSCOPY;  Surgeon: Bobie FORBES Cathlyn JAYSON Nikki, MD;  Location: [facility];  Service: Gynecology;  Laterality: N/A;   MOUTH SURGERY     cyst in mouth   ROBOTIC ASSISTED TOTAL HYSTERECTOMY WITH SALPINGECTOMY Bilateral 08/21/2015   Procedure: ROBOTIC ASSISTED TOTAL HYSTERECTOMY ;  Surgeon: Bobie FORBES Cathlyn JAYSON Nikki, MD;  Location: [facility];  Service: Gynecology;  Laterality: Bilateral;   WISDOM TOOTH EXTRACTION      Medications: Current Outpatient Medications  Medication Sig Dispense Refill   adalimumab (HUMIRA) 40 MG/0.4ML pen Inject into the skin.     Calcium Carbonate-Vit D-Min (CALTRATE 600+D PLUS MINERALS) 600-800 MG-UNIT TABS  One tab twice daily     estradiol  (ESTRACE ) 0.1 MG/GM vaginal cream Use 1/2 g vaginally and a pea size amount to the urethra every night for the first 2 weeks, then use 1/2 g vaginally and a pea size amount to the urethra two or three times per week as needed to maintain symptom relief. 42.5 g 1   estradiol  (VIVELLE -DOT) 0.025 MG/24HR Place 1 patch onto the skin 2 (two) times a week. 24 patch 3   fluconazole  (DIFLUCAN ) 150 MG tablet Take 1 tablet (150 mg total) by mouth every 3 (three) days. 2 tablet 0   folic acid (FOLVITE) 1 MG tablet Take 1 tablet by mouth daily.     methotrexate (RHEUMATREX) 2.5 MG tablet Take 2.5 mg by mouth once a week.     metroNIDAZOLE  (FLAGYL ) 500 MG tablet Take 1 tablet (500 mg  total) by mouth 2 (two) times daily. 14 tablet 0   valACYclovir  (VALTREX ) 500 MG tablet Take one tablet ( 500 mg) by mouth daily for infection prevention.  Take one tab (500 mg) PO bid x 3 days prn for genital outbreak.  Take 4 tabs (2000 mg) orally every 12 hours for 24 hours for oral outbreak. 100 tablet 3   No current facility-administered medications for this visit.    Allergies  Allergen Reactions   Gluten Meal Other (See Comments)    Auto immune   Tramadol      nausea   Codeine  Nausea And Vomiting   Peanut-Containing Drug Products Other (See Comments)    By allergy  test...has eaten peanuts without issues     Diagnoses:  F43.22  Plan of Care: Recommend ongoing therapy.  Pt participated in setting treatment goals.  Pt wants to improve coping skills and relationships.  Pt wants to have a safe place to talk.  Plan to meet monthly.   Treatment Plan Client Abilities/Strengths  Pt is bright, engaging and motivated for therapy.  Client Treatment Preferences  Individual therapy.  Client Statement of Needs  Improve coping skills and relationships. Symptoms  Autonomic hyperactivity (e.g., palpitations, shortness of breath, dry mouth, trouble swallowing, nausea, diarrhea). Excessive  and/or unrealistic worry that is difficult to control occurring more days than not for at least 6 months about a number of events or activities. Hypervigilance (e.g., feeling constantly on edge, experiencing concentration difficulties, having trouble falling or staying asleep, exhibiting a general state of irritability). Motor tension (e.g., restlessness, tiredness, shakiness, muscle tension). Problems Addressed  Anxiety Goals 1. Enhance ability to effectively cope with the full variety of life's worries and anxieties. 2. Learn and implement coping skills that result in a reduction of anxiety and worry, and improved daily functioning. Objective Learn to accept limitations in life and commit to tolerating, rather than avoiding, unpleasant emotions while accomplishing meaningful goals. Target Date: 2025-04-13 Frequency: Monthly Progress: 30 Modality: individual Related Interventions 1. Use techniques from Acceptance and Commitment Therapy to help client accept uncomfortable realities such as lack of complete control, imperfections, and uncertainty and tolerate unpleasant emotions and thoughts in order to accomplish value-consistent goals. Objective Learn and implement problem-solving strategies for realistically addressing worries. Target Date: 2025-04-13 Frequency: Monthly Progress: 30 Modality: individual Related Interventions 1. Assign the client a homework exercise in which he/she problem-solves a current problem.  review, reinforce success, and provide corrective feedback toward improvement. 2. Teach the client problem-solving strategies involving specifically defining a problem, generating options for addressing it, evaluating the pros and cons of each option, selecting and implementing an optional action, and reevaluating and refining the action. Objective Learn and implement calming skills to reduce overall anxiety and manage anxiety symptoms. Target Date: 2025-04-13 Frequency:  Monthly Progress: 30 Modality: individual Related Interventions 1. Assign the client to read about progressive muscle relaxation and other calming strategies in relevant books or treatment manuals (e.g., Progressive Relaxation Training by Thornell and Elmer; Mastery of Your Anxiety and Worry: Workbook by Richarda armin Given). 2. Assign the client homework each session in which he/she practices relaxation exercises daily, gradually applying them progressively from non-anxiety-provoking to anxiety-provoking situations; review and reinforce success while providing corrective feedback toward improvement. 3. Teach the client calming/relaxation skills (e.g., applied relaxation, progressive muscle relaxation, cue controlled relaxation; mindful breathing; biofeedback) and how to discriminate better between relaxation and tension; teach the client how to apply these skills to his/her daily life. 3. Reduce overall frequency, intensity,  and duration of the anxiety so that daily functioning is not impaired. 4. Resolve the core conflict that is the source of anxiety. 5. Stabilize anxiety level while increasing ability to function on a daily basis. Diagnosis :    F43.22 Conditions For Discharge Achievement of treatment goals and objectives.      Yolando Gillum, LCSW

## 2024-05-11 ENCOUNTER — Ambulatory Visit: Admitting: Psychology

## 2024-05-11 NOTE — Progress Notes (Unsigned)
 GYNECOLOGY  VISIT   HPI: 53 y.o.   Married  [place] female   [phone] with Patient's last menstrual period was 08/07/2015.   here for: Hot flashes worsening since she started estradiol  patches.       Feeling warmer at night and also during the day.    Current patch is not as adhesive as prior patch.   GYNECOLOGIC HISTORY: Patient's last menstrual period was 08/07/2015. Contraception:  Hyst Menopausal hormone therapy:  Estrace   Last 2 paps:  03/31/15 neg HR HPV neg History of abnormal Pap or positive HPV:  no Mammogram:  08/01/23 Breast Density Cat C, BIRADS Cat 1 neg         OB History     Gravida  5   Para  3   Term  3   Preterm      AB  2   Living  3      SAB  1   IAB  1   Ectopic      Multiple      Live Births                 Patient Active Problem List   Diagnosis Date Noted   Seasonal allergic rhinitis 12/12/2022   Hyperlipidemia 09/26/2022   Preventative health care 09/11/2021   Menopausal syndrome (hot flashes) 05/22/2021   Hot flashes due to menopause 04/24/2021   Crohn disease (HCC) 02/18/2017   History of uveitis 02/18/2017   Ankylosing spondylitis of multiple sites in spine (HCC) 01/29/2017   Encounter for long-term (current) use of high-risk medication 01/29/2017   Crohn's disease of both small and large intestine with complication (HCC) 11/01/2016   Elevated ferritin level 11/01/2016   History of colitis 03/29/2016   Elevated serum globulin level 12/29/2015   Depression 09/15/2015   Vitamin D  deficiency 09/08/2015   Anemia 09/08/2015   Status post laparoscopic hysterectomy 08/21/2015   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/12/2014   Extrinsic asthma 07/12/2014   Iron deficiency anemia 07/11/2014   HLA B27 positive 07/06/2014   Low serum vitamin D     Non-celiac gluten sensitivity    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HCC)     Past Medical History:  Diagnosis Date   Abnormal CT of the abdomen    Abnormal uterine bleeding    Anemia     iron def   Ankylosing spondylitis (HCC)    Chicken pox    Colitis    Crohn disease (HCC)    Dysrhythmia    occassional fluttering noted by patient   Genital warts    Gluten intolerance    History of chlamydia    History of hysterectomy 08/21/2015   HLA B27 (HLA B27 positive)    HSV-1 (herpes simplex virus 1) infection    Hyperlipidemia    Low calcium levels    Low iron    Low serum vitamin D     Muscle spasms of both lower extremities    Spasms in lower back   Non-celiac gluten sensitivity    Reactive airway disease    Spondyloarthritis    genetic marker   UTI (lower urinary tract infection)     Past Surgical History:  Procedure Laterality Date   ABDOMINAL HYSTERECTOMY  08/21/15   BILATERAL SALPINGECTOMY Bilateral 08/21/2015   Procedure: BILATERAL SALPINGECTOMY;  Surgeon: Bobie FORBES Cathlyn JAYSON Nikki, MD;  Location: [facility];  Service: Gynecology;  Laterality: Bilateral;   COLONOSCOPY     CYSTOSCOPY N/A 08/21/2015   Procedure: CYSTOSCOPY;  Surgeon:  Mammie Meras FORBES Cathlyn JAYSON Nikki, MD;  Location: [facility];  Service: Gynecology;  Laterality: N/A;   MOUTH SURGERY     cyst in mouth   ROBOTIC ASSISTED TOTAL HYSTERECTOMY WITH SALPINGECTOMY Bilateral 08/21/2015   Procedure: ROBOTIC ASSISTED TOTAL HYSTERECTOMY ;  Surgeon: Bobie FORBES Cathlyn JAYSON Nikki, MD;  Location: [facility];  Service: Gynecology;  Laterality: Bilateral;   WISDOM TOOTH EXTRACTION      Current Outpatient Medications  Medication Sig Dispense Refill   adalimumab (HUMIRA) 40 MG/0.4ML pen Inject into the skin.     Calcium Carbonate-Vit D-Min (CALTRATE 600+D PLUS MINERALS) 600-800 MG-UNIT TABS One tab twice daily     [START ON 05/13/2024] estradiol  (VIVELLE -DOT) 0.05 MG/24HR patch Vivelle  Dot, 0.05 mg, to skin twice weekly. 24 patch 1   folic acid (FOLVITE) 1 MG tablet Take 1 tablet by mouth daily.     methotrexate (RHEUMATREX) 2.5 MG tablet Take 2.5 mg by mouth once a week.     valACYclovir  (VALTREX ) 500 MG tablet Take one tablet (  500 mg) by mouth daily for infection prevention.  Take one tab (500 mg) PO bid x 3 days prn for genital outbreak.  Take 4 tabs (2000 mg) orally every 12 hours for 24 hours for oral outbreak. 100 tablet 3   estradiol  (ESTRACE ) 0.1 MG/GM vaginal cream Use 1/2 g vaginally and a pea size amount to the urethra every night for the first 2 weeks, then use 1/2 g vaginally and a pea size amount to the urethra two or three times per week as needed to maintain symptom relief. (Patient not taking: Reported on 05/12/2024) 42.5 g 1   fluconazole  (DIFLUCAN ) 150 MG tablet Take 1 tablet (150 mg total) by mouth every 3 (three) days. (Patient not taking: Reported on 05/12/2024) 2 tablet 0   metroNIDAZOLE  (FLAGYL ) 500 MG tablet Take 1 tablet (500 mg total) by mouth 2 (two) times daily. (Patient not taking: Reported on 05/12/2024) 14 tablet 0   No current facility-administered medications for this visit.     ALLERGIES: Gluten meal, Tramadol , Codeine , and Peanut-containing drug products  Family History  Problem Relation Age of Onset   Hypertension Mother    Diabetes Mother    Anxiety disorder Mother    Diabetes Father    Colon polyps Father    Prostate cancer Father    Cancer Paternal Grandmother        ? type    Social History   Socioeconomic History   Marital status: Married    Spouse name: Not on file   Number of children: Not on file   Years of education: Not on file   Highest education level: Not on file  Occupational History   Not on file  Tobacco Use   Smoking status: Never    Passive exposure: Never   Smokeless tobacco: Never  Vaping Use   Vaping status: Never Used  Substance and Sexual Activity   Alcohol use: Yes    Alcohol/week: 2.0 standard drinks of alcohol    Types: 2 Glasses of wine per week   Drug use: No   Sexual activity: Yes    Partners: Male    Birth control/protection: Surgical    Comment: Hyst  Other Topics Concern   Not on file  Social History Narrative   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 [name].    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      Hobbies: rest, reading, cooking, acting, piano, kids   Social Drivers of Corporate investment banker Strain: Not on file  Food Insecurity: Not on file  Transportation Needs: Not on file  Physical Activity: Not on file  Stress: Not on file  Social Connections: Unknown (02/05/2022)   Received from [facility]   Social Network    Social Network: Not on file  Intimate Partner Violence: Unknown (12/28/2021)   Received from [facility]   HITS    Physically Hurt: Not on file    Insult or Talk Down To: Not on file    Threaten Physical Harm: Not on file    Scream or Curse: Not on file    Review of Systems  All other systems reviewed and are negative.   PHYSICAL EXAMINATION:   BP 118/76 (BP Location: Left Arm, Patient Position: Sitting)   Pulse (!) 58   LMP 08/07/2015   SpO2 100%     General appearance: alert, cooperative and appears stated age   ASSESSMENT:  Menopausal vasomotor symptoms.   PLAN:  Will increase to Vivelle  Dot name brand, 0.05 mg to skin twice weekly.  #24, RF 1.  She prefers to use [employer] for the patch.  We discussed potential side effects of increasing estrogen:  breast sensitivity, fluid retention, headaches.  Given written instructions on how to use vaginal estradiol  cream. FU for annual exam and prn.

## 2024-05-12 ENCOUNTER — Encounter: Payer: Self-pay | Admitting: Obstetrics and Gynecology

## 2024-05-12 ENCOUNTER — Ambulatory Visit: Admitting: Obstetrics and Gynecology

## 2024-05-12 VITALS — BP 118/76 | HR 58

## 2024-05-12 DIAGNOSIS — N951 Menopausal and female climacteric states: Secondary | ICD-10-CM | POA: Diagnosis not present

## 2024-05-12 MED ORDER — ESTRADIOL 0.05 MG/24HR TD PTTW
MEDICATED_PATCH | TRANSDERMAL | 1 refills | Status: AC
Start: 2024-05-13 — End: ?

## 2024-05-12 NOTE — Patient Instructions (Signed)
 Your vaginal estradiol  dosage is 1/2 per vagina at bedtime 2 - 3 nights per week as needed.

## 2024-05-27 DIAGNOSIS — Z79899 Other long term (current) drug therapy: Secondary | ICD-10-CM | POA: Diagnosis not present

## 2024-05-27 DIAGNOSIS — K50819 Crohn's disease of both small and large intestine with unspecified complications: Secondary | ICD-10-CM | POA: Diagnosis not present

## 2024-05-27 DIAGNOSIS — Z79631 Long term (current) use of antimetabolite agent: Secondary | ICD-10-CM | POA: Diagnosis not present

## 2024-05-27 DIAGNOSIS — D84821 Immunodeficiency due to drugs: Secondary | ICD-10-CM | POA: Diagnosis not present

## 2024-05-27 DIAGNOSIS — M450 Ankylosing spondylitis of multiple sites in spine: Secondary | ICD-10-CM | POA: Diagnosis not present

## 2024-05-31 ENCOUNTER — Ambulatory Visit: Admitting: Family

## 2024-05-31 ENCOUNTER — Telehealth: Payer: Self-pay | Admitting: Family

## 2024-05-31 VITALS — BP 119/73 | HR 65 | Temp 97.7°F | Resp 16 | Ht 62.5 in | Wt 109.0 lb

## 2024-05-31 DIAGNOSIS — R5383 Other fatigue: Secondary | ICD-10-CM

## 2024-05-31 DIAGNOSIS — R7989 Other specified abnormal findings of blood chemistry: Secondary | ICD-10-CM

## 2024-05-31 DIAGNOSIS — Z23 Encounter for immunization: Secondary | ICD-10-CM

## 2024-05-31 NOTE — Telephone Encounter (Signed)
 See mychart.

## 2024-05-31 NOTE — Patient Instructions (Signed)
 VISIT SUMMARY:  Today, we reviewed your recent lab results and discussed your elevated ferritin levels, fatigue, and menopausal symptoms. We also talked about your autoimmune condition and general health maintenance.  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 Your rheumatologist is considering reducing your Humira dosage if your condition remains stable. -Continue current medications: Humira and methotrexate. -Consider tapering Humira if your condition remains well-managed.  ELEVATED FERRITIN LIKELY SECONDARY TO INFLAMMATION: Your ferritin levels have decreased from 1800 in 2018 to the 500s, likely due to inflammation from your autoimmune condition. -We will review your ferritin and iron levels further.  FATIGUE: Your fatigue may be related to your ketogenic diet and reduced carbohydrate intake, which can cause 'keto flu'. -Consider reintroducing more carbohydrates into your diet to see if it helps with your fatigue.  MENOPAUSAL SYMPTOMS (NIGHT SWEATS): You are experiencing night sweats and have been prescribed a higher dosage of estrogen by your gynecologist. -Collect and start taking the prescribed higher dosage of estrogen.  GENERAL HEALTH MAINTENANCE: You are a candidate for flu and Prevnar 20 vaccines due to your age and immunosuppressive therapy. -Administer flu vaccine. -Administer Prevnar 20 vaccine.

## 2024-05-31 NOTE — Progress Notes (Signed)
 Subjective:     Patient ID: Janice Zuniga, female    DOB: [birth year], 53 y.o.   MRN: [id number]  Chief Complaint  Patient presents with   Follow-up    To go over lab results from last week   Fatigue    Patient complains of fatigue    HPI  Discussed the use of AI scribe software for clinical note transcription with the patient, who gave verbal consent to proceed.  History of Present Illness  Janice Zuniga is a 53 year old female who presents for review of recent lab results. She is concerned about elevated ferritin levels, currently in the 500s, which is an improvement from 1800 in 2018. Previously, her ferritin levels reached 2000, and she received iron infusions that were later deemed unnecessary. She experiences fatigue and has recently switched to a keto diet, reducing carbohydrate intake, which she believes may contribute to her symptoms. She has started reintroducing carbohydrates into her diet. She also experiences night sweats and has been prescribed a higher dosage of estrogen by her gynecologist, which she has not yet collected. She is on Humira and methotrexate for an autoimmune condition. Her rheumatologist is considering reducing her Humira dosage, pending clearance from her gastroenterologist after a recent colonoscopy.      Health Maintenance Due  Topic Date Due   Hepatitis C Screening  Never done   Hepatitis B Vaccines 19-59 Average Risk (1 of 3 - 19+ 3-dose series) Never done   COVID-19 Vaccine (4 - 2025-26 season) 05/24/2024    Past Medical History:  Diagnosis Date   Abnormal CT of the abdomen    Abnormal uterine bleeding    Anemia    iron def   Ankylosing spondylitis (HCC)    Chicken pox    Colitis    Crohn disease (HCC)    Dysrhythmia    occassional fluttering noted by patient   Genital warts    Gluten intolerance    History of chlamydia    History of hysterectomy 08/21/2015   HLA B27 (HLA B27 positive)    HSV-1 (herpes simplex virus 1)  infection    Hyperlipidemia    Low calcium levels    Low iron    Low serum vitamin D     Muscle spasms of both lower extremities    Spasms in lower back   Non-celiac gluten sensitivity    Reactive airway disease    Spondyloarthritis    genetic marker   UTI (lower urinary tract infection)     Past Surgical History:  Procedure Laterality Date   ABDOMINAL HYSTERECTOMY  08/21/15   BILATERAL SALPINGECTOMY Bilateral 08/21/2015   Procedure: BILATERAL SALPINGECTOMY;  Surgeon: Bobie FORBES Cathlyn JAYSON Nikki, MD;  Location: [facility];  Service: Gynecology;  Laterality: Bilateral;   COLONOSCOPY     CYSTOSCOPY N/A 08/21/2015   Procedure: CYSTOSCOPY;  Surgeon: Bobie FORBES Cathlyn JAYSON Nikki, MD;  Location: [facility];  Service: Gynecology;  Laterality: N/A;   MOUTH SURGERY     cyst in mouth   ROBOTIC ASSISTED TOTAL HYSTERECTOMY WITH SALPINGECTOMY Bilateral 08/21/2015   Procedure: ROBOTIC ASSISTED TOTAL HYSTERECTOMY ;  Surgeon: Bobie FORBES Cathlyn JAYSON Nikki, MD;  Location: [facility];  Service: Gynecology;  Laterality: Bilateral;   WISDOM TOOTH EXTRACTION      Family History  Problem Relation Age of Onset   Hypertension Mother    Diabetes Mother    Anxiety disorder Mother    Diabetes Father    Colon polyps Father  Prostate cancer Father    Cancer Paternal Grandmother        ? type    Social History   Socioeconomic History   Marital status: Married    Spouse name: Not on file   Number of children: Not on file   Years of education: Not on file   Highest education level: Not on file  Occupational History   Not on file  Tobacco Use   Smoking status: Never    Passive exposure: Never   Smokeless tobacco: Never  Vaping Use   Vaping status: Never Used  Substance and Sexual Activity   Alcohol use: Yes    Alcohol/week: 2.0 standard drinks of alcohol    Types: 2 Glasses of wine per week   Drug use: No   Sexual activity: Yes    Partners: Male    Birth control/protection: Surgical    Comment: Hyst  Other  Topics Concern   Not on file  Social History Narrative   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 [name].    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      Hobbies: rest, reading, cooking, acting, piano, kids   Social Drivers of Corporate investment banker Strain: Not on file  Food Insecurity: Not on file  Transportation Needs: Not on file  Physical Activity: Not on file  Stress: Not on file  Social Connections: Unknown (02/05/2022)   Received from [facility]   Social Network    Social Network: Not on file  Intimate Partner Violence: Unknown (12/28/2021)   Received from [facility]   HITS    Physically Hurt: Not on file    Insult or Talk Down To: Not on file    Threaten Physical Harm: Not on file    Scream or Curse: Not on file    Outpatient Medications Prior to Visit  Medication Sig Dispense Refill   adalimumab (HUMIRA) 40 MG/0.4ML pen Inject into the skin.     Calcium Carbonate-Vit D-Min (CALTRATE 600+D PLUS MINERALS) 600-800 MG-UNIT TABS One tab twice daily     estradiol  (ESTRACE ) 0.1 MG/GM vaginal cream Use 1/2 g vaginally and a pea size amount to the urethra every night for the first 2 weeks, then use 1/2 g vaginally and a pea size amount to the urethra two or three times per week as needed to maintain symptom relief. 42.5 g 1   estradiol  (VIVELLE -DOT) 0.05 MG/24HR patch Vivelle  Dot, 0.05 mg, to skin twice weekly. 24 patch 1   folic acid (FOLVITE) 1 MG tablet Take 1 tablet by mouth daily.     methotrexate (RHEUMATREX) 2.5 MG tablet Take 2.5 mg by mouth once a week.     valACYclovir  (VALTREX ) 500 MG tablet Take one tablet ( 500 mg) by mouth daily for infection prevention.  Take one tab (500 mg) PO bid x 3 days prn for genital outbreak.  Take 4 tabs (2000 mg) orally every 12 hours for 24 hours for oral outbreak. 100 tablet 3   fluconazole  (DIFLUCAN )  150 MG tablet Take 1 tablet (150 mg total) by mouth every 3 (three) days. (Patient not taking: Reported on 05/12/2024) 2 tablet 0   metroNIDAZOLE  (FLAGYL ) 500 MG tablet Take 1 tablet (500 mg total) by mouth 2 (two) times daily. (Patient not taking: Reported on 05/12/2024) 14 tablet 0  No facility-administered medications prior to visit.    Allergies  Allergen Reactions   Gluten Meal Other (See Comments)    Auto immune   Tramadol      nausea   Codeine  Nausea And Vomiting   Peanut-Containing Drug Products Other (See Comments)    By allergy  test...has eaten peanuts without issues     ROS    See HPI Objective:    Physical Exam Constitutional:      General: She is not in acute distress.    Appearance: Normal appearance. She is well-developed.  HENT:     Head: Normocephalic and atraumatic.     Right Ear: External ear normal.     Left Ear: External ear normal.  Eyes:     General: No scleral icterus. Neck:     Thyroid : No thyromegaly.  Cardiovascular:     Rate and Rhythm: Normal rate and regular rhythm.     Heart sounds: Normal heart sounds. No murmur heard. Pulmonary:     Effort: Pulmonary effort is normal. No respiratory distress.     Breath sounds: Normal breath sounds. No wheezing.  Musculoskeletal:     Cervical back: Neck supple.  Skin:    General: Skin is warm and dry.  Neurological:     Mental Status: She is alert and oriented to person, place, and time.  Psychiatric:        Mood and Affect: Mood normal.        Behavior: Behavior normal.        Thought Content: Thought content normal.        Judgment: Judgment normal.      BP 119/73 (BP Location: Left Arm, Patient Position: Sitting, Cuff Size: Small)   Pulse 65   Temp 97.7 F (36.5 C) (Oral)   Resp 16   Ht 5' 2.5 (1.588 m)   Wt 109 lb (49.4 kg)   LMP 08/07/2015   SpO2 100%   BMI 19.62 kg/m  Wt Readings from Last 3 Encounters:  05/31/24 109 lb (49.4 kg)  11/26/23 110 lb (49.9 kg)  11/06/23 111 lb 12.8  oz (50.7 kg)       Assessment & Plan:   Problem List Items Addressed This Visit       Unprioritized   Other fatigue    Fatigue possibly related to ketogenic diet and reduced carbohydrate intake. Recommended that she add carbs back into her diet in moderation. Also she has been having Menopausal symptoms (night sweats) Prescribed higher estrogen dosage by gynecologist. Hopefully that change will be helpful. Also, hx of autoimmune disease could also be a contributor to her fatigue.       Elevated ferritin level   Will obtain hemochromatosis testing. Remain off of iron supplements.  Suspect elevation of ferritin (570) with normal serum iron is due to her hx of autoimmune disease.      Other Visit Diagnoses       Needs flu shot    -  Primary   Relevant Orders   Flu vaccine trivalent PF, 6mos and older(Flulaval,Afluria,Fluarix,Fluzone) (Completed)     Need for pneumococcal vaccine       Relevant Orders   Pneumococcal conjugate vaccine 20-valent (Prevnar 20) (Completed)     I personally spent a total of 30 minutes in the care of the patient today including preparing to see the patient, getting/reviewing separately obtained history, performing a medically appropriate exam/evaluation, and independently interpreting results.  I have discontinued Chrys D. Akram's fluconazole  and metroNIDAZOLE . I am  also having her maintain her folic acid, Caltrate 600+D Plus Minerals, methotrexate, adalimumab, valACYclovir , estradiol , and estradiol .  No orders of the defined types were placed in this encounter.

## 2024-05-31 NOTE — Assessment & Plan Note (Signed)
  Fatigue possibly related to ketogenic diet and reduced carbohydrate intake. Recommended that she add carbs back into her diet in moderation. Also she has been having Menopausal symptoms (night sweats) Prescribed higher estrogen dosage by gynecologist. Hopefully that change will be helpful. Also, hx of autoimmune disease could also be a contributor to her fatigue.

## 2024-05-31 NOTE — Assessment & Plan Note (Signed)
 Will obtain hemochromatosis testing. Remain off of iron supplements.  Suspect elevation of ferritin (570) with normal serum iron is due to her hx of autoimmune disease.

## 2024-06-02 ENCOUNTER — Other Ambulatory Visit (INDEPENDENT_AMBULATORY_CARE_PROVIDER_SITE_OTHER)

## 2024-06-02 ENCOUNTER — Other Ambulatory Visit

## 2024-06-02 DIAGNOSIS — R7989 Other specified abnormal findings of blood chemistry: Secondary | ICD-10-CM | POA: Diagnosis not present

## 2024-06-10 ENCOUNTER — Ambulatory Visit: Payer: Self-pay | Admitting: Family

## 2024-06-10 LAB — HEMOCHROMATOSIS DNA-PCR(C282Y,H63D)

## 2024-06-15 ENCOUNTER — Ambulatory Visit: Admitting: Psychology

## 2024-06-30 DIAGNOSIS — M450 Ankylosing spondylitis of multiple sites in spine: Secondary | ICD-10-CM | POA: Diagnosis not present

## 2024-06-30 DIAGNOSIS — Z79899 Other long term (current) drug therapy: Secondary | ICD-10-CM | POA: Diagnosis not present

## 2024-07-20 ENCOUNTER — Ambulatory Visit: Admitting: Psychology

## 2024-08-26 ENCOUNTER — Ambulatory Visit: Admitting: Obstetrics and Gynecology

## 2024-08-26 ENCOUNTER — Telehealth: Payer: Self-pay | Admitting: *Deleted

## 2024-08-26 ENCOUNTER — Encounter: Payer: Self-pay | Admitting: Obstetrics and Gynecology

## 2024-08-26 VITALS — BP 116/78 | HR 75 | Temp 98.0°F | Ht 63.25 in | Wt 111.0 lb

## 2024-08-26 DIAGNOSIS — N898 Other specified noninflammatory disorders of vagina: Secondary | ICD-10-CM

## 2024-08-26 DIAGNOSIS — N952 Postmenopausal atrophic vaginitis: Secondary | ICD-10-CM | POA: Diagnosis not present

## 2024-08-26 DIAGNOSIS — N958 Other specified menopausal and perimenopausal disorders: Secondary | ICD-10-CM

## 2024-08-26 DIAGNOSIS — R3915 Urgency of urination: Secondary | ICD-10-CM

## 2024-08-26 LAB — URINALYSIS, COMPLETE W/RFL CULTURE
Bacteria, UA: NONE SEEN /HPF
Bilirubin Urine: NEGATIVE
Glucose, UA: NEGATIVE
Hgb urine dipstick: NEGATIVE
Hyaline Cast: NONE SEEN /LPF
Ketones, ur: NEGATIVE
Leukocyte Esterase: NEGATIVE
Nitrites, Initial: NEGATIVE
Protein, ur: NEGATIVE
RBC / HPF: NONE SEEN /HPF (ref 0–2)
Specific Gravity, Urine: 1.01 (ref 1.001–1.035)
WBC, UA: NONE SEEN /HPF (ref 0–5)
pH: 6 (ref 5.0–8.0)

## 2024-08-26 LAB — NO CULTURE INDICATED

## 2024-08-26 MED ORDER — ESTRADIOL 0.01 % VA CREA: TOPICAL_CREAM | VAGINAL | 1 refills | Status: AC

## 2024-08-26 NOTE — Telephone Encounter (Signed)
 Spoke with patient. Patient reports fatigue, urinary urgency and heaviness in pelvis over the past couple of days. Denies itching, odor, d/c, bleeding. Had a previous RX for fluconazole , took 1 tab on 08/25/24. No improvement in symptoms, feels she needs to get things checked out. Reports chills. Denies flank pain.   Patient of Dr. Nikki, has also seen [facility] In the past. Patient agreeable to schedule with available provider. OV scheduled for today at 1600 with Dr. Dallie. Advised I will send to provider for review, our office will f/u if any additional recommendations. Patient agreeable.   Last OV 11/26/23  Routing to provider for final review. Patient is agreeable to disposition. Will close encounter.  CC: Dr. Nikki

## 2024-08-26 NOTE — Progress Notes (Signed)
 53 y.o. [id number] female here for problem visit. Married.  Patient's last menstrual period was 08/07/2015.   She reports feeling a lot of fatigue, discomfort, bloatiness, some discharge. Symptoms have been present for 2-3 days. She has tried one tablet of diflucan , took it yesterday. Has used macrobid  for PC abx ppx Urine sample provided: Yes  Birth control: Hysterectomy Sexually active: Yes   GYN HISTORY: No sig hx  OB History  Gravida Para Term Preterm AB Living  5 3 3  2 3   SAB IAB Ectopic Multiple Live Births  1 1       # Outcome Date GA Lbr Len/2nd Weight Sex Type Anes PTL Lv  5 IAB           4 SAB           3 Term           2 Term           1 Term            Past Medical History:  Diagnosis Date   Abnormal CT of the abdomen    Abnormal uterine bleeding    Anemia    iron def   Ankylosing spondylitis (HCC)    Chicken pox    Colitis    Crohn disease (HCC)    Dysrhythmia    occassional fluttering noted by patient   Genital warts    Gluten intolerance    History of chlamydia    History of hysterectomy 08/21/2015   HLA B27 (HLA B27 positive)    HSV-1 (herpes simplex virus 1) infection    Hyperlipidemia    Low calcium levels    Low iron    Low serum vitamin D     Muscle spasms of both lower extremities    Spasms in lower back   Non-celiac gluten sensitivity    Reactive airway disease    Spondyloarthritis    genetic marker   UTI (lower urinary tract infection)    Past Surgical History:  Procedure Laterality Date   ABDOMINAL HYSTERECTOMY  08/21/15   BILATERAL SALPINGECTOMY Bilateral 08/21/2015   Procedure: BILATERAL SALPINGECTOMY;  Surgeon: Bobie FORBES Cathlyn JAYSON Nikki, MD;  Location: [facility];  Service: Gynecology;  Laterality: Bilateral;   COLONOSCOPY     CYSTOSCOPY N/A 08/21/2015   Procedure: CYSTOSCOPY;  Surgeon: Bobie FORBES Cathlyn JAYSON Nikki, MD;  Location: [facility];  Service: Gynecology;  Laterality: N/A;   MOUTH SURGERY     cyst in mouth   ROBOTIC ASSISTED  TOTAL HYSTERECTOMY WITH SALPINGECTOMY Bilateral 08/21/2015   Procedure: ROBOTIC ASSISTED TOTAL HYSTERECTOMY ;  Surgeon: Bobie FORBES Cathlyn JAYSON Nikki, MD;  Location: [facility];  Service: Gynecology;  Laterality: Bilateral;   WISDOM TOOTH EXTRACTION     Current Outpatient Medications on File Prior to Visit  Medication Sig Dispense Refill   Calcium Carbonate-Vit D-Min (CALTRATE 600+D PLUS MINERALS) 600-800 MG-UNIT TABS One tab twice daily     estradiol  (ESTRACE ) 0.1 MG/GM vaginal cream Use 1/2 g vaginally and a pea size amount to the urethra every night for the first 2 weeks, then use 1/2 g vaginally and a pea size amount to the urethra two or three times per week as needed to maintain symptom relief. 42.5 g 1   estradiol  (VIVELLE -DOT) 0.05 MG/24HR patch Vivelle  Dot, 0.05 mg, to skin twice weekly. 24 patch 1   folic acid  (FOLVITE ) 1 MG tablet Take 1 tablet by mouth daily.     HUMIRA , 2  PEN, 40 MG/0.4ML pen SMARTSIG:40 Milligram(s) SUB-Q Every 2 Weeks     methotrexate (RHEUMATREX) 2.5 MG tablet Take 2.5 mg by mouth once a week.     Multiple Vitamins-Minerals (MULTIVITAMIN ADULTS PO) Take by mouth.     Probiotic Product (PRO-BIOTIC BLEND PO) Take by mouth.     valACYclovir  (VALTREX ) 500 MG tablet Take one tablet ( 500 mg) by mouth daily for infection prevention.  Take one tab (500 mg) PO bid x 3 days prn for genital outbreak.  Take 4 tabs (2000 mg) orally every 12 hours for 24 hours for oral outbreak. 100 tablet 3   No current facility-administered medications on file prior to visit.   Allergies  Allergen Reactions   Gluten Meal Other (See Comments)    Auto immune   Tramadol      nausea   Codeine  Nausea And Vomiting   Peanut-Containing Drug Products Other (See Comments)    By allergy  test...has eaten peanuts without issues       PE Today's Vitals   08/26/24 1621  BP: 116/78  Pulse: 75  Temp: 98 F (36.7 C)  TempSrc: Oral  SpO2: 98%  Weight: 111 lb (50.3 kg)  Height: 5' 3.25 (1.607 m)    Body mass index is 19.51 kg/m.  Physical Exam Vitals reviewed. Exam conducted with a chaperone present.  Constitutional:      General: She is not in acute distress.    Appearance: Normal appearance.  HENT:     Head: Normocephalic and atraumatic.     Nose: Nose normal.  Eyes:     Extraocular Movements: Extraocular movements intact.     Conjunctiva/sclera: Conjunctivae normal.  Pulmonary:     Effort: Pulmonary effort is normal.  Genitourinary:    General: Normal vulva.     Exam position: Lithotomy position.     Vagina: Normal. No vaginal discharge.     Adnexa: Right adnexa normal and left adnexa normal.     Comments: Uterus and cervix absent Musculoskeletal:        General: Normal range of motion.     Cervical back: Normal range of motion.  Neurological:     General: No focal deficit present.     Mental Status: She is alert.  Psychiatric:        Mood and Affect: Mood normal.        Behavior: Behavior normal.      Assessment and Plan:        Genitourinary syndrome of menopause -     Estradiol ; Apply 0.5g to vulva nightly for 2 weeks then 2 times a week. Do not use applicator.  Dispense: 42.5 g; Refill: 1 Hx of recurrent UTI and current vaginal discomfort Hx of BV earlier this year Reviewed safety profile of low dose vaginal estrogen, however reviewed that higher doses have been associated with DVT, breast and uterine cancer.    Urinary urgency -     Urinalysis,Complete w/RFL Culture Negative  Other orders -     REFLEXIVE URINE CULTURE   Vera LULLA Pa, MD

## 2024-08-27 DIAGNOSIS — N898 Other specified noninflammatory disorders of vagina: Secondary | ICD-10-CM | POA: Diagnosis not present

## 2024-08-27 LAB — WET PREP FOR TRICH, YEAST, CLUE

## 2024-08-27 NOTE — Addendum Note (Signed)
 Addended by: DALLIE BOLLARD V on: 08/27/2024 11:37 AM   Modules accepted: Orders

## 2024-10-05 ENCOUNTER — Encounter: Admitting: Family

## 2024-10-05 ENCOUNTER — Encounter: Payer: Federal, State, Local not specified - PPO | Admitting: Family

## 2024-11-02 ENCOUNTER — Ambulatory Visit: Payer: Federal, State, Local not specified - PPO | Admitting: Obstetrics and Gynecology
